# Patient Record
Sex: Male | Born: 1970 | Race: White | Hispanic: No | Marital: Married | Smoking: Never smoker
Health system: Southern US, Community
[De-identification: ages and names within clinical notes are randomized; demographics above are authoritative.]

## PROBLEM LIST (undated history)

## (undated) DIAGNOSIS — F419 Anxiety disorder, unspecified: Secondary | ICD-10-CM

## (undated) DIAGNOSIS — T7840XA Allergy, unspecified, initial encounter: Secondary | ICD-10-CM

## (undated) DIAGNOSIS — F32A Depression, unspecified: Secondary | ICD-10-CM

## (undated) DIAGNOSIS — F329 Major depressive disorder, single episode, unspecified: Secondary | ICD-10-CM

## (undated) DIAGNOSIS — F3181 Bipolar II disorder: Secondary | ICD-10-CM

## (undated) HISTORY — DX: Allergy, unspecified, initial encounter: T78.40XA

## (undated) HISTORY — DX: Anxiety disorder, unspecified: F41.9

## (undated) HISTORY — DX: Bipolar II disorder: F31.81

## (undated) HISTORY — DX: Depression, unspecified: F32.A

## (undated) HISTORY — PX: TEMPOROMANDIBULAR JOINT SURGERY: SHX35

## (undated) HISTORY — PX: OTHER SURGICAL HISTORY: SHX169

## (undated) HISTORY — PX: HERNIA REPAIR: SHX51

## (undated) HISTORY — DX: Major depressive disorder, single episode, unspecified: F32.9

---

## 2005-12-24 ENCOUNTER — Ambulatory Visit: Payer: Self-pay | Admitting: Internal Medicine

## 2006-02-16 ENCOUNTER — Ambulatory Visit: Payer: Self-pay | Admitting: Internal Medicine

## 2007-01-05 ENCOUNTER — Ambulatory Visit: Payer: Self-pay | Admitting: Internal Medicine

## 2007-03-23 ENCOUNTER — Telehealth (INDEPENDENT_AMBULATORY_CARE_PROVIDER_SITE_OTHER): Payer: Self-pay | Admitting: *Deleted

## 2007-04-05 DIAGNOSIS — F329 Major depressive disorder, single episode, unspecified: Secondary | ICD-10-CM | POA: Insufficient documentation

## 2008-04-13 ENCOUNTER — Ambulatory Visit: Payer: Self-pay | Admitting: Family Medicine

## 2008-04-13 DIAGNOSIS — K5289 Other specified noninfective gastroenteritis and colitis: Secondary | ICD-10-CM | POA: Insufficient documentation

## 2008-04-13 DIAGNOSIS — E860 Dehydration: Secondary | ICD-10-CM | POA: Insufficient documentation

## 2008-04-13 DIAGNOSIS — F319 Bipolar disorder, unspecified: Secondary | ICD-10-CM | POA: Insufficient documentation

## 2010-09-17 NOTE — Progress Notes (Signed)
  Phone Note From Pharmacy   Reason for Call: Needs renewal Summary of Call: refill Seroquel 50 mg. # 60  no  refills faxed to [facility] [employer]) Initial call taken by: Lynann Beaver CMA,  March 23, 2007 1:38 PM

## 2010-09-17 NOTE — Assessment & Plan Note (Signed)
Summary: nausea/vomiting/diarrhea/dm   Vital Signs:  Patient Profile:   40 Years Old Male Weight:      198 pounds Temp:     98.3 degrees F Pulse rate:   82 / minute BP sitting:   120 / 80  (left arm) Cuff size:   regular  Vitals Entered By: Pura Spice, RN (April 13, 2008 2:01 PM)                 Chief Complaint:  diarrhea and vomiting .  History of Present Illness: Onset diarrhea nausea and vomiting over past 24 hrs, unable to retain fluids. Has had 6-8 loose stools, nonblack, no blood. No contact with other sick people with GI problems desires refill Rx no other complaints    Current Allergies (reviewed today): No known allergies      Review of Systems       The patient complains of abdominal pain.  The patient denies anorexia, fever, weight loss, weight gain, vision loss, decreased hearing, hoarseness, chest pain, syncope, dyspnea on exertion, peripheral edema, prolonged cough, headaches, hemoptysis, melena, hematochezia, severe indigestion/heartburn, hematuria, incontinence, genital sores, muscle weakness, suspicious skin lesions, transient blindness, difficulty walking, depression, unusual weight change, abnormal bleeding, enlarged lymph nodes, angioedema, breast masses, and testicular masses.     Physical Exam  General:     Well-developed,well-nourished,in no acute distress; alert,appropriate and cooperative throughout examination Head:     Normocephalic and atraumatic without obvious abnormalities. No apparent alopecia or balding. Eyes:     No corneal or conjunctival inflammation noted. EOMI. Perrla. Funduscopic exam benign, without hemorrhages, exudates or papilledema. Vision grossly normal. Ears:     External ear exam shows no significant lesions or deformities.  Otoscopic examination reveals clear canals, tympanic membranes are intact bilaterally without bulging, retraction, inflammation or discharge. Hearing is grossly normal bilaterally. Nose:  External nasal examination shows no deformity or inflammation. Nasal mucosa are pink and moist without lesions or exudates. Mouth:     dfry oral mucosa Lungs:     Normal respiratory effort, chest expands symmetrically. Lungs are clear to auscultation, no crackles or wheezes. Heart:     Normal rate and regular rhythm. S1 and S2 normal without gallop, murmur, click, rub or other extra sounds. Abdomen:     generalised tnendernessno distention, no masses, no guarding, no hepatomegaly, no splenomegaly, and bowel sounds hyperactive.   Rectal:     NE    Impression & Recommendations:  Problem # 1:  GASTROENTERITIS (ICD-558.9) Assessment: New  His updated medication list for this problem includes:    Lomotil 2.5-0.025 Mg Tabs (Diphenoxylate-atropine) .Marland Kitchen... 2 tabs qid as needed for diarrhea  Orders: Promethazine up to 50mg  (J2550) Admin of Therapeutic Inj  intramuscular or subcutaneous (08657)   Problem # 2:  BIPOLAR DISORDER UNSPECIFIED (ICD-296.80) Assessment: Unchanged  Problem # 3:  DEHYDRATION (ICD-276.51) Assessment: New  Complete Medication List: 1)  Lexapro 10 Mg Tabs (Escitalopram oxalate) .... Take 1 tablet by mouth once a day 2)  Seroquel 25 Mg Tabs (Quetiapine fumarate) .... Take 2 tablet by mouth at bedtime 3)  Vicodin 5-500 Mg Tabs (Hydrocodone-acetaminophen) .... Take 1 tablet by mouth twice a day 4)  Lomotil 2.5-0.025 Mg Tabs (Diphenoxylate-atropine) .... 2 tabs qid as needed for diarrhea 5)  Promethazine Hcl 25 Mg Tabs (Promethazine hcl) .Marland Kitchen.. 1 q 4h as needed to prevent nausea vomiting   Patient Instructions: 1)  acute gastroenteritis 2)  phenergan 50 mg  Im for vomiting 3)  Phenergan tabs  25 1 every 4 hours if needed to prevent nausea vomitin 4)  Lomitil for diarrhea 5)  refilled  seroquel and lexapro 6)  diet clear liquids as gatorade, ease back to reg diet   Prescriptions: PROMETHAZINE HCL 25 MG TABS (PROMETHAZINE HCL) 1 q 4h as needed to prevent nausea  vomiting  #50 x 1   Entered and Authorized by:   Judithann Sheen MD   Signed by:   Judithann Sheen MD on 04/13/2008   Method used:   Electronically to        [facility] [phone]* (retail)       [address] [place], [place]  [address]       Ph: [phone] or [phone]       Fax: [phone]   RxID:   [id number] LEXAPRO 10 MG TABS (ESCITALOPRAM OXALATE) Take 1 tablet by mouth once a day  #30 x 5   Entered and Authorized by:   Judithann Sheen MD   Signed by:   Judithann Sheen MD on 04/13/2008   Method used:   Electronically to        [facility] [phone]* (retail)       [address] [place], [place]  [address]       Ph: [phone] or [phone]       Fax: [phone]   RxID:   [id number] SEROQUEL 25 MG TABS (QUETIAPINE FUMARATE) Take 2 tablet by mouth at bedtime  #60 x 5   Entered and Authorized by:   Judithann Sheen MD   Signed by:   Judithann Sheen MD on 04/13/2008   Method used:   Electronically to        [facility] [phone]* (retail)       [address] [place], [place]  [address]       Ph: [phone] or [phone]       Fax: [phone]   RxID:   [id number] LOMOTIL 2.5-0.025 MG TABS (DIPHENOXYLATE-ATROPINE) 2 tabs qid as needed for diarrhea  #50 x 0   Entered and Authorized by:   Judithann Sheen MD   Signed by:   Judithann Sheen MD on 04/13/2008   Method used:   Print then Give to Patient   RxID:   [id number] SEROQUEL 25 MG TABS (QUETIAPINE FUMARATE) Take 2 tablet by mouth at bedtime  #60 x 5   Entered and Authorized by:   Judithann Sheen MD   Signed by:   Judithann Sheen MD on 04/13/2008   Method used:   Electronically to        [facility] [phone]* (retail)       [address]       [place], [place]  [address]       Ph: [phone]       Fax: [phone]   RxID:   [id number] LEXAPRO 10 MG  TABS (ESCITALOPRAM OXALATE) Take 1 tablet by mouth once a day  #30 x 5   Entered and Authorized by:   Judithann Sheen MD   Signed by:   Judithann Sheen MD on 04/13/2008   Method used:   Electronically to  [facility]. [phone]* (retail)       [address]       [place], [place]  [address]       Ph: [phone]       Fax: [phone]   RxID:   [id number]  ]  Medication Administration  Injection # 1:    Medication: Promethazine up to 50mg     Diagnosis: GASTROENTERITIS (ICD-558.9)    Route: IM    Site: RUOQ gluteus    Exp Date: 01/2009    Lot #: [id number]    Mfr: [email or web address]    Patient tolerated injection without complications    Given by: Pura Spice, RN (April 13, 2008 2:48 PM)  Orders Added: 1)  Promethazine up to 50mg  [J2550] 2)  Admin of Therapeutic Inj  intramuscular or subcutaneous [96372] 3)  Est. Patient Level III [72536]

## 2012-03-03 ENCOUNTER — Ambulatory Visit: Payer: Self-pay | Admitting: Emergency Medicine

## 2012-03-03 VITALS — BP 120/72 | HR 90 | Temp 98.2°F | Resp 16 | Ht 70.0 in | Wt 170.0 lb

## 2012-03-03 DIAGNOSIS — J018 Other acute sinusitis: Secondary | ICD-10-CM

## 2012-03-03 MED ORDER — AMOXICILLIN-POT CLAVULANATE 875-125 MG PO TABS: 1.0000 | ORAL_TABLET | Freq: Two times a day (BID) | ORAL | Status: AC

## 2012-03-03 NOTE — Progress Notes (Signed)
  Subjective:    Patient ID: Tristan [name], male    DOB: [birth year], 41 y.o.   MRN: [id number]  Sinus Problem This is a recurrent problem. The current episode started 1 to 4 weeks ago. The problem has been gradually worsening since onset. The maximum temperature recorded prior to his arrival was 100 - 100.9 F. The fever has been present for 1 to 2 days. The pain is mild. Associated symptoms include chills, congestion, coughing, sinus pressure and a sore throat. Pertinent negatives include no diaphoresis, ear pain, headaches, hoarse voice, neck pain, shortness of breath, sneezing or swollen glands. Past treatments include oral decongestants.  Cough The current episode started in the past 7 days. The problem occurs hourly. The cough is productive of purulent sputum. Associated symptoms include chills, a fever, nasal congestion, postnasal drip and a sore throat. Pertinent negatives include no chest pain, ear congestion, ear pain, headaches, heartburn, hemoptysis, myalgias, rash, rhinorrhea, shortness of breath, sweats, weight loss or wheezing. Nothing aggravates the symptoms.      Review of Systems  Constitutional: Positive for fever and chills. Negative for weight loss and diaphoresis.  HENT: Positive for congestion, sore throat, postnasal drip and sinus pressure. Negative for ear pain, hoarse voice, rhinorrhea, sneezing and neck pain.   Eyes: Negative.   Respiratory: Positive for cough. Negative for hemoptysis, shortness of breath and wheezing.   Cardiovascular: Negative for chest pain.  Gastrointestinal: Negative for heartburn.  Genitourinary: Negative.   Musculoskeletal: Negative for myalgias.  Skin: Negative for rash.  Neurological: Negative for headaches.       Objective:   Physical Exam  Constitutional: He is oriented to person, place, and time. He appears well-developed and well-nourished.  HENT:  Head: Normocephalic and atraumatic.  Eyes: Conjunctivae are normal. Pupils are  equal, round, and reactive to light.  Neck: Normal range of motion. Neck supple.  Cardiovascular: Normal rate and regular rhythm.   Pulmonary/Chest: Effort normal.  Abdominal: Soft.  Musculoskeletal: Normal range of motion.  Neurological: He is alert and oriented to person, place, and time.  Skin: Skin is warm and dry.          Assessment & Plan:  Acute sinusitis. augmentin mucinex d  Follow up as needed

## 2013-01-11 ENCOUNTER — Telehealth: Payer: Self-pay | Admitting: Internal Medicine

## 2013-01-11 ENCOUNTER — Ambulatory Visit (INDEPENDENT_AMBULATORY_CARE_PROVIDER_SITE_OTHER): Payer: Self-pay | Admitting: Family Medicine

## 2013-01-11 ENCOUNTER — Encounter: Payer: Self-pay | Admitting: Family Medicine

## 2013-01-11 VITALS — BP 102/68 | Temp 98.1°F | Wt 182.0 lb

## 2013-01-11 DIAGNOSIS — L255 Unspecified contact dermatitis due to plants, except food: Secondary | ICD-10-CM

## 2013-01-11 DIAGNOSIS — L237 Allergic contact dermatitis due to plants, except food: Secondary | ICD-10-CM

## 2013-01-11 MED ORDER — PREDNISONE 20 MG PO TABS: ORAL_TABLET | ORAL | Status: DC

## 2013-01-11 NOTE — Telephone Encounter (Signed)
Patient Information:  Caller Name: Lanora Manis  Phone: [phone]  Patient: Tristan [name], Tristan [name]  Gender: Male  DOB: 16-Sep-1970  Age: 42 Years  PCP: Birdie Sons (Adults only)  Office Follow Up:  Does the office need to follow up with this patient?: Yes  Instructions For The Office: Reqeusting Rx for Prednisone without office visit.  Please see note.  RN Note:  Sympotms improved while using old Prednisone RX but recurred when ran out of Prednisone after 6 or 7 days.  Comfort measures reviewed including use of cold compresses & Benadryl for itching.  Reviewed signs of cellulits.   Symptoms  Reason For Call & Symptoms: Requesting refill of Prednisone taper for poison ivy rash on forearms and legs.  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  Requests Prednisone be refilled due to high deductible health plan.  Or advise how much it would cost to be seen in the office.  Pharmacy:[facility] at [facility].  Please call back to advise if must be seen or Rx will be ordered.

## 2013-01-11 NOTE — Telephone Encounter (Signed)
Pt is sch for today with Dr Selena Batten 4pm

## 2013-01-11 NOTE — Progress Notes (Signed)
Chief Complaint  Patient presents with  . 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 got better, but then got worse after stopped prednisone a few days ago ROS: See pertinent positives and negatives per HPI.  No past medical history on file.  No family history on file.  History   Social History  . Marital Status: Married    Spouse Name: N/A    Number of Children: N/A  . Years of Education: N/A   Social History Main Topics  . Smoking status: Never Smoker   . Smokeless tobacco: None  . Alcohol Use: Yes     Comment: rarely mixed drinks  . Drug Use: None  . Sexually Active: None   Other Topics Concern  . None   Social History Narrative  . None    Current outpatient prescriptions:predniSONE (DELTASONE) 20 MG tablet, 60mg  (3 tabs) for 3 days, then 40mg  (2 tabs) for 3 days, then 20mg  (1 tab for 3 days) then stop, Disp: 18 tablet, Rfl: 0  EXAM:  Filed Vitals:   01/11/13 1600  BP: 102/68  Temp: 98.1 F (36.7 C)    Body mass index is 26.11 kg/(m^2).  GENERAL: vitals reviewed and listed above, alert, oriented, appears well hydrated and in no acute distress  HEENT: atraumatic, conjunttiva clear, no obvious abnormalities on inspection of external nose and ears  NECK: no obvious masses on inspection  LUNGS: clear to auscultation bilaterally, no wheezes, rales or rhonchi, good air movement  CV: HRRR, no peripheral edema  SKIN: erythematous papular vesicular rash on arms and legs in patchy distribution with excoriations  MS: moves all extremities without noticeable abnormality  PSYCH: pleasant and cooperative, no obvious depression or anxiety  ASSESSMENT AND  PLAN:  Discussed the following assessment and plan:  Poison oak - Plan: predniSONE (DELTASONE) 20 MG tablet  -discussed options, since widespread and seems to have had rebound with stopping short course steroids he would like to do longer course. Discussed risks. Return precautions. -Patient advised to return or notify a doctor immediately if symptoms worsen or persist or new concerns arise.  There are no Patient Instructions on file for this visit.   Kriste Basque R.

## 2013-01-11 NOTE — Telephone Encounter (Signed)
Dr Cato Mulligan is out of the office this week.  Pt needs to be seen

## 2013-05-22 ENCOUNTER — Ambulatory Visit: Payer: Self-pay | Admitting: Family Medicine

## 2013-05-22 VITALS — BP 122/74 | HR 79 | Temp 98.1°F | Resp 18 | Ht 71.25 in | Wt 164.2 lb

## 2013-05-22 DIAGNOSIS — S0085XA Superficial foreign body of other part of head, initial encounter: Secondary | ICD-10-CM

## 2013-05-22 DIAGNOSIS — G501 Atypical facial pain: Secondary | ICD-10-CM

## 2013-05-22 DIAGNOSIS — S0005XA Superficial foreign body of scalp, initial encounter: Secondary | ICD-10-CM

## 2013-05-22 DIAGNOSIS — IMO0001 Reserved for inherently not codable concepts without codable children: Secondary | ICD-10-CM

## 2013-05-22 NOTE — Progress Notes (Signed)
Subjective:    Patient ID: Tristan [name], male    DOB: [birth year], 42 y.o.   MRN: [id number]   9:07 AM - This chart was scribed for Dr. Milus Glazier, MD, by Valera Castle, ED Scribe.    HPI Tristan [name] is a 42 year old male who reports to the [facility] being hit in the face by a piece of wood when the threw it into a machine for work, 3 weeks ago. He states that when he threw in the wood it bounced back and struck him on the right side of his face below his eye. He reports that he received a black eye, along with swelling around the right facial area. He reports that he was initially able to take out a piece of wood from the area and that he applied ice and the swelling gradually resided. He reports that the area has healed over a bit since the initial incident but that it is still sore to the touch, and that it is uncomfortable to sleep on at night. He states he usually sleeps on that side of his face. He reports that cracking his nose also exacerbates the pain. He was worried that he had a major fracture initially. He requests that the splinter be removed.   Past Medical History  Diagnosis Date  . Allergy   . Depression   . Anxiety   . Bipolar 2 disorder    Past Surgical History  Procedure Laterality Date  . Hernia repair    . Undescended testicle    . Temporomandibular joint surgery     Family History  Problem Relation Age of Onset  . Diabetes Father   . Stroke Father   . Heart disease Father   . Hypertension Father    History   Social History  . Marital Status: Married    Spouse Name: N/A    Number of Children: N/A  . Years of Education: N/A   Occupational History  . Not on file.   Social History Main Topics  . Smoking status: Never Smoker   . Smokeless tobacco: Not on file  . Alcohol Use: Yes     Comment: rarely mixed drinks  . Drug Use: Not on file  . Sexual Activity: Not on file   Other Topics Concern  . Not on file   Social  History Narrative  . No narrative on file   No Known Allergies   Review of Systems  Skin:       Wood splinter to right face with small laceration.  All other systems reviewed and are negative.      Objective:   Physical Exam  Nursing note and vitals reviewed. Constitutional: He is oriented to person, place, and time. He appears well-developed and well-nourished. No distress.  HENT:  Head: Normocephalic and atraumatic.  Eyes: EOM are normal.  Neck: Neck supple. No tracheal deviation present.  Cardiovascular: Normal rate.   Pulmonary/Chest: Effort normal. No respiratory distress.  Musculoskeletal: Normal range of motion.  Neurological: He is alert and oriented to person, place, and time.  Skin: Skin is warm and dry.  Small laceration with wood splinter to right cheek.  Psychiatric: He has a normal mood and affect. His behavior is normal.     Triage Vitals:  Filed Vitals:   05/22/13 0844  BP: 122/74  Pulse: 79  Temp: 98.1 F (36.7 C)  TempSrc: Oral  Resp: 18  Height: 5' 11.25" (1.81 m)  Weight:  164 lb 3.2 oz (74.481 kg)  SpO2: 100%       Assessment & Plan:  DIAGNOSTIC STUDIES: Oxygen Saturation is 100% on room air, normal by my interpretation.    COORDINATION OF CARE: 9:18 AM - Discussed treatment plan with pt at bedside and pt agreed to plan. Discussed needing to perform a procedure which includes numbing the pt's right cheek and removing a piece of wood.  9:51 AM - Procedure performed by Dr. Milus Glazier. Area prepped with Betadine .5 cm area to right facial area 2 mm foreign body found Wound closed with 5.0 horizontal mattress stitch. Pt became vagal, but recovered.    Sutures were applied to wound. Advised pt that the closed wound should heal in a few days.   Plan:  Recheck 48 hours.  Patient may need surgical referral for further exploration  Signed, Elvina Sidle, MD

## 2015-05-17 ENCOUNTER — Telehealth: Payer: Self-pay | Admitting: Internal Medicine

## 2015-05-17 NOTE — Telephone Encounter (Signed)
Pt would like to est with dr Amador Cunas. Pt would like an internal med md. Can I sch?

## 2015-05-17 NOTE — Telephone Encounter (Signed)
I am no longer taking new patients  

## 2015-05-18 NOTE — Telephone Encounter (Signed)
Pt wife is aware

## 2016-02-27 ENCOUNTER — Ambulatory Visit (INDEPENDENT_AMBULATORY_CARE_PROVIDER_SITE_OTHER): Payer: Managed Care, Other (non HMO)

## 2016-02-27 ENCOUNTER — Ambulatory Visit (INDEPENDENT_AMBULATORY_CARE_PROVIDER_SITE_OTHER): Payer: Managed Care, Other (non HMO) | Admitting: Urgent Care

## 2016-02-27 VITALS — BP 132/80 | HR 66 | Temp 97.3°F | Resp 16 | Ht 71.0 in | Wt 173.8 lb

## 2016-02-27 DIAGNOSIS — M7989 Other specified soft tissue disorders: Secondary | ICD-10-CM | POA: Diagnosis not present

## 2016-02-27 DIAGNOSIS — M79641 Pain in right hand: Secondary | ICD-10-CM | POA: Diagnosis not present

## 2016-02-27 MED ORDER — NAPROXEN SODIUM 550 MG PO TABS: 550.0000 mg | ORAL_TABLET | Freq: Two times a day (BID) | ORAL | Status: DC

## 2016-02-27 NOTE — Progress Notes (Signed)
    MRN: [id number] DOB: Mar 04, 1971  Subjective:   Tristan [name] is a 45 y.o. male presenting for chief complaint of Hand Injury  Reports onset of right hand discomfort this morning (03:00). Has since developed dull aching pain worsened by gripping, grasping. Also has swelling. Has been painting a lot more the past 2 weeks. Works as an Tree surgeonartist, is right hand. Has tried APAP once with some relief. Denies fever, trauma, redness, numbness or tingling. Denies history of hand injuries, surgeries.   Tristan [name] has a current medication list which includes the following prescription(s): prednisone. Also has No Known Allergies.  Tristan [name]  has a past medical history of Allergy; Depression; Anxiety; and Bipolar 2 disorder (HCC). Also  has past surgical history that includes Hernia repair; Undescended testicle; and Temporomandibular joint surgery.  Objective:   Vitals: BP 132/80 mmHg  Pulse 66  Temp(Src) 97.3 F (36.3 C)  Resp 16  Ht 5\' 11"  (1.803 m)  Wt 173 lb 12.8 oz (78.835 kg)  BMI 24.25 kg/m2  SpO2 99%  Physical Exam  Constitutional: He is oriented to person, place, and time. He appears well-developed and well-nourished.  Cardiovascular: Normal rate.   Pulmonary/Chest: Effort normal.  Musculoskeletal:       Right hand: He exhibits tenderness (over area depicted with ROM testing only). He exhibits normal range of motion, no bony tenderness, normal two-point discrimination, normal capillary refill, no deformity, no laceration and no swelling. Normal sensation noted. Normal strength noted.       Hands: Neurological: He is alert and oriented to person, place, and time.  Skin: Skin is warm and dry.   Dg Hand Complete Right  02/27/2016  CLINICAL DATA:  Acute right hand pain and swelling. No known injury. EXAM: RIGHT HAND - COMPLETE 3+ VIEW COMPARISON:  None. FINDINGS: There is no evidence of fracture or dislocation. There is no evidence of arthropathy or other focal bone abnormality. Soft  tissues are unremarkable. IMPRESSION: Negative. Electronically Signed   By: Harmon PierJeffrey  Hu M.D.   On: 02/27/2016 18:52   Assessment and Plan :   1. Right hand pain 2. Swelling of right hand - Likely due to overuse secondary to increased activity/painting. Recommended conservative management, start Anaprox. RTC in 1 week if no improvement. Consider referral to PT or orthopedics.  Wallis BambergMario Arliss Frisina, PA-C [facility] [phone] 02/27/2016 6:09 PM

## 2016-02-27 NOTE — Patient Instructions (Signed)
     IF you received an x-ray today, you will receive an invoice from [facility]. Please contact [facility] at [phone] with questions or concerns regarding your invoice.   IF you received labwork today, you will receive an invoice from [facility]. Please contact [facility] at [phone] with questions or concerns regarding your invoice.   Our billing staff will not be able to assist you with questions regarding bills from these companies.  You will be contacted with the lab results as soon as they are available. The fastest way to get your results is to activate your My Chart account. Instructions are located on the last page of this paperwork. If you have not heard from us regarding the results in 2 weeks, please contact this office.      

## 2018-06-04 ENCOUNTER — Ambulatory Visit (INDEPENDENT_AMBULATORY_CARE_PROVIDER_SITE_OTHER): Payer: 59 | Admitting: Psychiatry

## 2018-06-04 DIAGNOSIS — F411 Generalized anxiety disorder: Secondary | ICD-10-CM

## 2018-06-04 DIAGNOSIS — Z630 Problems in relationship with spouse or partner: Secondary | ICD-10-CM

## 2018-06-04 DIAGNOSIS — F3131 Bipolar disorder, current episode depressed, mild: Secondary | ICD-10-CM | POA: Diagnosis not present

## 2018-06-04 NOTE — Assessment & Plan Note (Signed)
Primary issues of FOO strife Med sensitivites Gains made in PTSD-like ANS arousal and anger using alpha blocker, stress management, circadian rhythm strategies, and reduction in [employer]

## 2018-06-04 NOTE — Progress Notes (Signed)
[facility] Counselor/Therapist Progress Note   Patient ID: TERRELL OSTRAND, MRN: [id number]  Date: 06/04/2018  Timespent: 45 minutes Start time: 8:09a  Stop time: 8:54a  Treatment Type: Family with patient  Subjective:  Finally poised to get siding repairs done, working through further dealings with [employer] and contractors, with wife handling the administrative communication work.  Dealing with a disputed invoice and insurance limitations on payment.  Relationally, both continue commitment to offering each other grace and staying courteously mindful of each other stress level, quick to frame each other's reactions as environmental rather than personal.  Both are also embarking on quitting smoking, she tobacco, he THC.  Attitude is cautiously optimistic on both parts.  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  Notes anxiety approaching his first use of a resident casting device, is approaching inner dialogue himself and anxiety, between himself and his demanding voice.  Buoyed by a very positive experience at a small, quirky art show in [place] last weekend, where he sold a large amount of small items he had wanted to clean out, made particularly interesting connections with like minded artists and self-described weirdos, found that his materials were exceptionally well received, including being notably used in a performer's puppet show.  A positive experience on many levels, including increased sense of connection, gaining fans, making space and money, and gaining a sense of comeback after a February experience with the same group where he felt particularly alienated, anxious, and agitated.    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  At the same time, he foresees "seasonal mother fuckers" in his birthday near Thanksgiving, and the traditional family holidays coming up, and in diminishing daylight this time of year.  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  In addition, [name] will have foot surgery on 11/12, which will entail PT dividing caregiving time which may compete with his artistic interests but which will also likely serve as bonding time.  Wife's job, largely pursued from home, has been particularly stressful, and may at times trigger outbursts from her, but again, both members of the couple are practicing quick recognition in [place] with each other's venting.  Interventions:Solution Focused, Strength-based and Anger Management Training Affirmed work in progress, particularly maintaining grace under pressure and maintaining benevolent interpretation with each other.  Affirmed diligence approaching his work and internal stresses.  Role played anxious, demanding, and supportive observer voices in his inner dialogue as pertains to the experience of approaching resin work.  Discussed options for seasonal light therapy and endorsed the idea of a full spectrum light for the shop.  Mental Status Exam:   Appearance:   Casual and idiosyncratic fashion, appropriate     Behavior:  Appropriate and Sharing  Motor:  Normal  Speech/Language:   Clear and Coherent and goal-directed  Affect:  Appropriate  Mood:  mildly anxious, generally more positive  Thought process:  Coherent  Thought content:    NA  Perceptual disturbances:    Normal  Orientation:  Full (Time, Place, and Person)  Attention:  Good  Concentration:  good  Memory:  N/A  Fund of knowledge:   Good  Insight:    Good  Judgment:   Good  Impulse  Control:  good    Reported Symptoms:  Apprehension/worry, anxious anticipation (of depressive symptoms, evocative memories)  Risk Assessment: Danger to Self:  No Self-injurious Behavior: No Danger to Others: No Duty to Warn:no Physical Aggression / Violence:No  Access to Firearms a concern: No  Gang Involvement:No   Diagnosis:   ICD-10-CM   1. Generalized anxiety disorder F41.1   2. Bipolar affective disorder, currently depressed, mild (HCC) F31.31   3. Relationship problem between partners Z63.0      Plan:  . Continue to approach art work and any internal apprehension about activities employing supportive-voice mental dialogue . Schedule follow-up on birthday, preparatory for Thanksgiving triggers.   Call if earlier need. . Continue to utilize previously learned skills ad lib . Maintain medication, if prescribed, and work faithfully with relevant prescriber(s) . Call the clinic on-call service, present to ER, or call 911 if any life-threatening emergency   [name], PhD

## 2018-06-08 ENCOUNTER — Encounter: Payer: Self-pay | Admitting: Emergency Medicine

## 2018-06-08 DIAGNOSIS — G4700 Insomnia, unspecified: Secondary | ICD-10-CM | POA: Insufficient documentation

## 2018-06-08 DIAGNOSIS — F1210 Cannabis abuse, uncomplicated: Secondary | ICD-10-CM | POA: Insufficient documentation

## 2018-06-08 DIAGNOSIS — F514 Sleep terrors [night terrors]: Secondary | ICD-10-CM

## 2018-06-08 DIAGNOSIS — F4310 Post-traumatic stress disorder, unspecified: Secondary | ICD-10-CM | POA: Insufficient documentation

## 2018-06-08 DIAGNOSIS — F411 Generalized anxiety disorder: Secondary | ICD-10-CM | POA: Insufficient documentation

## 2018-06-18 ENCOUNTER — Encounter: Payer: Self-pay | Admitting: Physician Assistant

## 2018-06-18 ENCOUNTER — Ambulatory Visit (INDEPENDENT_AMBULATORY_CARE_PROVIDER_SITE_OTHER): Payer: 59 | Admitting: Physician Assistant

## 2018-06-18 DIAGNOSIS — F319 Bipolar disorder, unspecified: Secondary | ICD-10-CM | POA: Diagnosis not present

## 2018-06-18 DIAGNOSIS — F411 Generalized anxiety disorder: Secondary | ICD-10-CM

## 2018-06-18 DIAGNOSIS — G4700 Insomnia, unspecified: Secondary | ICD-10-CM

## 2018-06-18 MED ORDER — HYDROXYZINE HCL 25 MG PO TABS: 25.0000 mg | ORAL_TABLET | Freq: Three times a day (TID) | ORAL | 1 refills | Status: DC | PRN

## 2018-06-18 MED ORDER — OXCARBAZEPINE 300 MG PO TABS: 300.0000 mg | ORAL_TABLET | ORAL | 1 refills | Status: DC

## 2018-06-18 MED ORDER — BUSPIRONE HCL 15 MG PO TABS: ORAL_TABLET | ORAL | 1 refills | Status: DC

## 2018-06-18 MED ORDER — TRAZODONE HCL 50 MG PO TABS: 50.0000 mg | ORAL_TABLET | Freq: Every day | ORAL | 1 refills | Status: DC

## 2018-06-18 MED ORDER — LAMOTRIGINE 150 MG PO TABS: 150.0000 mg | ORAL_TABLET | Freq: Two times a day (BID) | ORAL | 1 refills | Status: DC

## 2018-06-18 NOTE — Progress Notes (Signed)
[facility] Med Check  Patient ID: Tristan [name],  MRN: [id number]  PCP: Patient, No Pcp Per  Date of Evaluation: 06/18/2018 Time spent:15 minutes  Chief Complaint:  Chief Complaint    Follow-up      HISTORY/CURRENT STATUS: HPI Here for 6 week med check.  Accompanied by his wife Tristan [name].  At LOV, we stopped the Prazosin b/c lack of stamina, energy and motivation.  Since he is gotten completely off of it, he feels much better.  He is able to do more things that he wants to do.  On the flip side, the anxiety has gotten a little bit worse.  The hydroxyzine helps, as long as he is not already too anxious.  It does not cause too much drowsiness.  The anxiety is more generalized, rather than panic attack type physical symptoms.  He denies night terrors now.  He does have weird dreams but nothing traumatic.  He is able to enjoy some things.  He is not isolating.  He has not smoked pot in several weeks, and is feeling good about that.  He has been using the trazodone again which has helped him sleep.  If he does not use it, he has trouble going to sleep and does not feel as rested when he gets up.  Past medications for mental health diagnoses include: Celexa, Lamictal, Lexapro, Seroquel, praises and, Trileptal, trazodone, Vistaril  Individual Medical History/ Review of Systems: Changes? :Yes Urinary leakage has completely resolved since of Prazosin  Allergies: Patient has no known allergies.  Current Medications:  Current Outpatient Medications:  .  hydrOXYzine (ATARAX/VISTARIL) 25 MG tablet, Take 1 tablet (25 mg total) by mouth every 8 (eight) hours as needed for anxiety., Disp: 270 tablet, Rfl: 1 .  lamoTRIgine (LAMICTAL) 150 MG tablet, Take 1 tablet (150 mg total) by mouth 2 (two) times daily., Disp: 180 tablet, Rfl: 1 .  Oxcarbazepine (TRILEPTAL) 300 MG tablet, Take 1 tablet (300 mg total) by mouth every morning. 1 qam and 2 qpm, Disp: 270 tablet, Rfl: 1 .  traZODone (DESYREL)  50 MG tablet, Take 50-100 mg by mouth at bedtime as needed for sleep., Disp: , Rfl:  .  busPIRone (BUSPAR) 15 MG tablet, 1/3 po bid x 5 days, then 2/3 po bid x 5 days, then 1 po bid., Disp: 60 tablet, Rfl: 1 .  hydrOXYzine (VISTARIL) 25 MG capsule, Take 25 mg by mouth 4 (four) times daily., Disp: , Rfl:  .  naproxen sodium (ANAPROX DS) 550 MG tablet, Take 1 tablet (550 mg total) by mouth 2 (two) times daily with a meal. (Patient not taking: Reported on 06/18/2018), Disp: 30 tablet, Rfl: 1 .  prazosin (MINIPRESS) 1 MG capsule, Take 1 mg by mouth at bedtime. Take 4 every night for a week, then 3 for a week, then 2 for a week, then 1 for a week, then stop. If nightmares recur, stay at whatever dose and call provider., Disp: , Rfl:  .  prazosin (MINIPRESS) 5 MG capsule, Take 5 mg by mouth at bedtime., Disp: , Rfl:  .  traZODone (DESYREL) 50 MG tablet, Take 1-2 tablets (50-100 mg total) by mouth at bedtime., Disp: 180 tablet, Rfl: 1 Medication Side Effects: none  Family Medical/ Social History: Changes? No  MENTAL HEALTH EXAM:  There were no vitals taken for this visit.There is no height or weight on file to calculate BMI.  General Appearance: Well Groomed  Eye Contact:  Good  Speech:  Clear and Coherent  Volume:  Normal  Mood:  Anxious  Affect:  Anxious  Thought Process:  Goal Directed  Orientation:  Full (Time, Place, and Person)  Thought Content: Logical   Suicidal Thoughts:  No  Homicidal Thoughts:  No  Memory:  WNL  Judgement:  Good  Insight:  Good  Psychomotor Activity:  Normal  Concentration:  Concentration: Good  Recall:  Good  Fund of Knowledge: Good  Language: Good  Assets:  Desire for Improvement  ADL's:  Intact  Cognition: WNL  Prognosis:  Good    DIAGNOSES:    ICD-10-CM   1. Bipolar I disorder (HCC) F31.9   2. Insomnia, unspecified type G47.00   3. Generalized anxiety disorder F41.1     Receiving Psychotherapy: Yes With Dr.Andy Mitchem   RECOMMENDATIONS: We  discussed other options to help with the anxiety.  We could increase the Trileptal, and check a level a week or so after the increase.  Another option would be to add BuSpar.  Another option is to take the hydroxyzine routinely versus as needed.  Or add an SSRI or SNRI, or a benzo, these options are not preferred by [place]. He decided to add BuSpar 15 mg one third twice daily for 5 days, two thirds twice daily for 5 days and then 1 p.o. twice daily.   Continue Trileptal, Lamictal, trazodone, hydroxyzine as above. Continue psychotherapy with Dr. Doran Heater Return in 6 weeks.   Melony Overly, PA-C

## 2018-07-09 ENCOUNTER — Ambulatory Visit (INDEPENDENT_AMBULATORY_CARE_PROVIDER_SITE_OTHER): Payer: Managed Care, Other (non HMO) | Admitting: Psychiatry

## 2018-07-09 DIAGNOSIS — F1210 Cannabis abuse, uncomplicated: Secondary | ICD-10-CM

## 2018-07-09 DIAGNOSIS — F411 Generalized anxiety disorder: Secondary | ICD-10-CM

## 2018-07-09 DIAGNOSIS — F4310 Post-traumatic stress disorder, unspecified: Secondary | ICD-10-CM

## 2018-07-09 DIAGNOSIS — F319 Bipolar disorder, unspecified: Secondary | ICD-10-CM

## 2018-07-09 NOTE — Progress Notes (Signed)
Psychotherapy Progress Note -- Marliss CzarAndy Tata Timmins, PhD, [facility]  Patient ID: Tristan [name]     MRN: [id number]     Date: 07/09/2018     Treatment Type: Family therapy w/ patient Start: 8:14a Stop: 9:05a Time Spent: 51 min Accompanied by: wife [name]  Self-Report (interim history, self-report of stressors and symptoms, application of prior therapy, status changes) Further frustrations in construction/repair process, including incomplete siding delivery, unrelated HVAC replacement due to a gas leak not caught 3 times before, delivery driver mangled the gate and fence (necessary for their dogs).  Forced new round of getting estimates and 6-8 weeks' wait, unanticipated vibration from siding work  Dog needed emergency dental surgery, 25 teeth out.  W had foot surgery, regretfully snapped at PT twice recently but no harm according to him.    Birthday today, not with the old sense of dread.  Texts from family numbers for which he has deleted wishing happy birthday.  W apprehensive about seeing her family with the rampant drinking, mother's crabby self-absorption, the sarcasm and surliness of the whole group.    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  Tempted to pack up early after weak sales and some product breakage by kids, but impressed self and wife with resiliency against anger and anxiety, not leaving or raging, just checking in with friends.  Gratified to see 2 of his icons in the art world, man who greatly inspired him with her creativity and kindness decades ago, a kind of homecoming interacting with them.  Therapies used: Solution-Oriented/Positive Psychology and Ego-Supportive  Intervention notes: Reviewed social and organizational stresses, affirming patience shown, humor recognized, quickness  to recognize demands and expectations, quickness to redirect self, expanding definition of family, peace made with losses of relationship with literal family, much more natural forgiveness across moments on wife's part.  Refreshed strategies and coping with dysfunctional family dynamics expected with wife's family next week.  Ego support offered to both in being authentic and non-catastrophic and interacting with people through the holidays.  Mental Status/Observations:     Appearance:   Casual, Fairly Groomed and (due to shower out of commission)     Behavior:  Appropriate  Motor:  Normal  Speech/Language:   Clear and Coherent  Affect:  Appropriate  Mood:  normal and less apprehensive, more accepting  Thought process:  normal  Thought content:    WNL  Sensory/Perceptual disturbances:    WNL  Orientation:  intact  Attention:  Good  Concentration:  Good  Memory:  WNL  Fund of knowledge:   NA  Insight:    Good  Judgment:   Good  Impulse Control:  Good   Risk Assessment: Danger to Self:  No Self-injurious Behavior: No Danger to Others: No Duty to Warn:no Physical Aggression / Violence:No  Access to Firearms a concern: No   Diagnosis:   ICD-10-CM   1. Generalized anxiety disorder F41.1   2. Bipolar I disorder (HCC) F31.9   3. Tetrahydrocannabinol (THC) use disorder, mild, abuse F12.10   4. PTSD (post-traumatic stress disorder) F43.10     Progress rating:  Greatly improved  Plan:  . Continue prompt and adept support for each other, positive regard, quickness to offer comfort where needed . Continue to catch strong expectations and demands of self and situation  and neutralize where appropriate . Continue to utilize previously learned skills ad lib . Maintain medication, if prescribed, and work faithfully with relevant prescriber(s) . Call the clinic on-call service, present to ER, or call 911 if any life-threatening emergency . Follow up with me in about 3 weeks  [name], PhD

## 2018-07-12 ENCOUNTER — Other Ambulatory Visit: Payer: Self-pay

## 2018-07-12 MED ORDER — BUSPIRONE HCL 15 MG PO TABS: ORAL_TABLET | ORAL | 0 refills | Status: DC

## 2018-07-30 ENCOUNTER — Encounter: Payer: Self-pay | Admitting: Physician Assistant

## 2018-07-30 ENCOUNTER — Ambulatory Visit (INDEPENDENT_AMBULATORY_CARE_PROVIDER_SITE_OTHER): Payer: 59 | Admitting: Psychiatry

## 2018-07-30 ENCOUNTER — Ambulatory Visit (INDEPENDENT_AMBULATORY_CARE_PROVIDER_SITE_OTHER): Payer: 59 | Admitting: Physician Assistant

## 2018-07-30 DIAGNOSIS — G4700 Insomnia, unspecified: Secondary | ICD-10-CM

## 2018-07-30 DIAGNOSIS — F319 Bipolar disorder, unspecified: Secondary | ICD-10-CM | POA: Diagnosis not present

## 2018-07-30 DIAGNOSIS — F411 Generalized anxiety disorder: Secondary | ICD-10-CM

## 2018-07-30 DIAGNOSIS — Z630 Problems in relationship with spouse or partner: Secondary | ICD-10-CM

## 2018-07-30 MED ORDER — BUSPIRONE HCL 15 MG PO TABS: ORAL_TABLET | ORAL | Status: DC

## 2018-07-30 NOTE — Progress Notes (Signed)
Psychotherapy Progress Note -- Marliss CzarAndy Eluterio Seymour, PhD, [facility]  Patient ID: Tristan BeechFrederick W Shelnutt     MRN: [id number]     Date: 07/30/2018     Therapy format: Family therapy w/ patient Start: 8:14a Stop: 9:08a Time Spent: 54 min Accompanied by: none  Session narrative -- interim history, self-report of stressors and symptoms, applications of prior therapy, status changes, and interventions in session Made it through quiet Thanksgiving, did the SharonGriffin family (hers, expanded).  Nothing explosive or frustrating nor irlksome.  Didn't need the [employer] game idea.  [name]'s mom was surprisingly controlled vs her usual mercurial ways, and plenty of choices who to mix with.  Slept some on the way home. Both taking lessons about reducing anticipation   Wife recovered from acute aftermath of foot surgery, out of a boot now. PT always ready for her optimistic self-expectations to come down.    Home repairs nearly finished, just the damaged gate and bill for.  Have begun to put things back where they want them, largely enjoying the peace and quiet of no workers after 6 months in chaos and a cascade of frustrations.  Looking ahead, no particular anxiety about family or about feeling the amputation of them acutely.  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  Stymied so far by a 1-chance-to-get-it-right art task he keeps fantasizing going wrong.  Guided success imagery in session, visualizing the feared 1st step, seeing steady hands and his tools well-guided to create the desired effect, and the satisfaction of doing so.    Tamela OddiBetsy fighting worry thoughts herself about two household systems that could fail.  Spoke through in session to the "nag" in her head, reframing her internal dialogue.  Dealt briefly with her holiday grief as well (2nd Xmas since father, 3rd since abortion), and self-imposed pressure to find her own Christmas trappings.  Noted sense of duty, guilt imposed  by mother, to keep and remember ancestors' things, deep wish to bear a child (though it was medically next to impossible to carry to term) and introduce to her father, who died mere weeks after the due date.  Validated wanting to do the impossible, and offered image of aunt, uncle, and father all letting her know it's OK, they need nothing done out of obligation, only want her to know choice and authority over her own living and the joy of a loving partnership.  Therapeutic modalities: Solution-Oriented/Positive Psychology, Humanistic/Existential and Grief Therapy  Mental Status/Observations:  Appearance:   Casual     Behavior:  Appropriate  Motor:  Normal  Speech/Language:   Clear and Coherent  Affect:  Appropriate  Mood:  anxious and relatively mild  Thought process:  normal  Thought content:    WNL and realistic concerns about meeting expectations  Sensory/Perceptual disturbances:    WNL  Orientation:  WNL  Attention:  Good  Concentration:  Good  Memory:  WNL  Insight:    Good  Judgment:   Good  Impulse Control:  Good   Risk Assessment: Danger to Self:  No Self-injurious Behavior: No Danger to Others: No Duty to Warn:no Physical Aggression / Violence:No  Access to Firearms a concern: No   Diagnosis:   ICD-10-CM   1. Bipolar I disorder (HCC) F31.9   2. Generalized anxiety disorder F41.1   3. Relationship problem between partners Z63.0     Assessment of progress:  improving  Plan:  . Continue beneficent approach to each other, seek to find the "  get to" more htan the "have to" in season's tasks . Follow through on today's artistic ice-breaking . Continue to utilize previously learned skills ad lib . Maintain medication, if prescribed, and work faithfully with relevant prescriber(s) . Call the clinic on-call service, present to ER, or call 911 if any life-threatening emergency . Follow up with me in about 1 mo  [name], PhD

## 2018-07-30 NOTE — Progress Notes (Signed)
[facility] Check  Patient ID: Tristan [name],  MRN: [id number]  PCP: Patient, No Pcp Per  Date of Evaluation: 07/30/2018 Time spent:15 minutes  Chief Complaint:  Chief Complaint    Follow-up      HISTORY/CURRENT STATUS: HPI For 6 week med check after starting Buspar. Accompanied by wife, Tamela OddiBetsy.  After starting the Buspar, the anxiety is a little better. He still wakes up with anxiety every morning.  He can't lay in bed or he gets more anxious.  If he gets up and gets busy, he's ok after a little while. Not so much PA but an underlying sense of uneasiness and being on age.  The hydroxyzine helps as well.  He is having some bad dreams at night but there is nothing like they used to be.  The imagery does not last all day long like they used to.  He is able to enjoy things more.  His energy and motivation is pretty good.  He is not isolating.  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  Patient denies increased energy with decreased need for sleep, no increased talkativeness, no racing thoughts, no impulsivity or risky behaviors, no increased spending, no increased libido, no grandiosity. No increased irritability.  He sleeps pretty well with the trazodone.  Individual Medical History/ Review of Systems: Changes? :No    Past medications for mental health diagnoses include: Celexa, Lamictal, Lexapro, Seroquel, praises and, Trileptal, trazodone, Vistaril  Allergies: Patient has no known allergies.  Current Medications:  Current Outpatient Medications:  .  hydrOXYzine (ATARAX/VISTARIL) 25 MG tablet, Take 1 tablet (25 mg total) by mouth every 8 (eight) hours as needed for anxiety., Disp: 270 tablet, Rfl: 1 .  hydrOXYzine (VISTARIL) 25 MG capsule, Take 25 mg by mouth 4 (four) times daily., Disp: , Rfl:  .  lamoTRIgine (LAMICTAL) 150 MG tablet, Take 1 tablet (150 mg total) by mouth 2 (two) times daily., Disp: 180 tablet, Rfl: 1 .  Oxcarbazepine  (TRILEPTAL) 300 MG tablet, Take 1 tablet (300 mg total) by mouth every morning. 1 qam and 2 qpm, Disp: 270 tablet, Rfl: 1 .  traZODone (DESYREL) 50 MG tablet, Take 1-2 tablets (50-100 mg total) by mouth at bedtime., Disp: 180 tablet, Rfl: 1 .  busPIRone (BUSPAR) 15 MG tablet, Take 1 in the morning, and 1.3 pills in evening for 10 days then may increase to 1.6 pills for 10 day, and then may increase to 2 pills q evening if needed., Disp: , Rfl:  Medication Side Effects: none  Family Medical/ Social History: Changes? No  MENTAL HEALTH EXAM:  There were no vitals taken for this visit.There is no height or weight on file to calculate BMI.  General Appearance: Casual and Well Groomed  Eye Contact:  Good  Speech:  Clear and Coherent  Volume:  Normal  Mood:  Euthymic  Affect:  Appropriate  Thought Process:  Goal Directed  Orientation:  Full (Time, Place, and Person)  Thought Content: Logical   Suicidal Thoughts:  No  Homicidal Thoughts:  No  Memory:  WNL  Judgement:  Good  Insight:  Good  Psychomotor Activity:  Normal  Concentration:  Concentration: Good  Recall:  Good  Fund of Knowledge: Good  Language: Good  Assets:  Desire for Improvement  ADL's:  Intact  Cognition: WNL  Prognosis:  Good    DIAGNOSES:    ICD-10-CM   1. Bipolar I disorder (HCC) F31.9   2. Insomnia,  unspecified type G47.00   3. Generalized anxiety disorder F41.1     Receiving Psychotherapy: Yes Dr. Any Mitchum   RECOMMENDATIONS: Increase BuSpar 15 mg by continuing 1 p.o. every morning but then 1.3 pills in the evening for 10 days and if needed can increase to 1.6 pills for 10 days and then if still anxious in the mornings go up to 2 pills at night he and wife verbalized understanding. Continue all other medications. Continue psychotherapy with Dr. Farrel Demark. Return in 6 weeks.   Melony Overly, PA-C

## 2018-08-27 ENCOUNTER — Ambulatory Visit: Payer: Managed Care, Other (non HMO) | Admitting: Psychiatry

## 2018-09-10 ENCOUNTER — Encounter: Payer: Self-pay | Admitting: Physician Assistant

## 2018-09-10 ENCOUNTER — Ambulatory Visit (INDEPENDENT_AMBULATORY_CARE_PROVIDER_SITE_OTHER): Payer: 59 | Admitting: Physician Assistant

## 2018-09-10 ENCOUNTER — Ambulatory Visit (INDEPENDENT_AMBULATORY_CARE_PROVIDER_SITE_OTHER): Payer: 59 | Admitting: Psychiatry

## 2018-09-10 DIAGNOSIS — F411 Generalized anxiety disorder: Secondary | ICD-10-CM

## 2018-09-10 DIAGNOSIS — F4310 Post-traumatic stress disorder, unspecified: Secondary | ICD-10-CM

## 2018-09-10 DIAGNOSIS — F319 Bipolar disorder, unspecified: Secondary | ICD-10-CM | POA: Diagnosis not present

## 2018-09-10 DIAGNOSIS — Z630 Problems in relationship with spouse or partner: Secondary | ICD-10-CM | POA: Diagnosis not present

## 2018-09-10 DIAGNOSIS — F3131 Bipolar disorder, current episode depressed, mild: Secondary | ICD-10-CM | POA: Diagnosis not present

## 2018-09-10 MED ORDER — DIAZEPAM 2 MG PO TABS: 1.0000 mg | ORAL_TABLET | Freq: Three times a day (TID) | ORAL | 0 refills | Status: DC | PRN

## 2018-09-10 MED ORDER — BUSPIRONE HCL 15 MG PO TABS: ORAL_TABLET | ORAL | 1 refills | Status: DC

## 2018-09-10 NOTE — Progress Notes (Signed)
[facility]  Patient ID: Tristan [name],  MRN: [id number]  PCP: Patient, No Pcp Per  Date of Evaluation: 09/10/2018 Time spent:15 minutes  Chief Complaint:   HISTORY/CURRENT STATUS: HPI Here for routine med check.  Tamela OddiBetsy, wife, with him.   Noticing that he tenses up in crowds. Unable to enjoy things he likes to do b/c he tenses up.  Didn't realize this was anxiety. But since he's learning to categorize his feelings, he understands that it shouldn't be that way." I always thought it was just me." Like at a concert or at his shows, he will get sweaty palms, he will have leg shakes or rub his hands on his pants.  The hydroxyzine does help but not enough.  It does not generally cause drowsiness.  Patient denies loss of interest in usual activities and is able to enjoy things.  Denies decreased energy or motivation.  Appetite has not changed.  No extreme sadness, tearfulness, or feelings of hopelessness.  Denies any changes in concentration, making decisions or remembering things.  Denies suicidal or homicidal thoughts.  Patient denies increased energy with decreased need for sleep, no increased talkativeness, no racing thoughts, no impulsivity or risky behaviors, no increased spending, no increased libido, no grandiosity.  He sleeps pretty well now.  Not having night terrors.  Individual Medical History/ Review of Systems: Changes? :No    Past medications for mental health diagnoses include: Celexa, Lamictal, Lexapro, Seroquel, praises and, Trileptal, trazodone, Vistaril   Allergies: Patient has no known allergies.  Current Medications:  Current Outpatient Medications:  .  busPIRone (BUSPAR) 15 MG tablet, Take 1 in the morning, and 1.3 pills in evening for 10 days then may increase to 1.6 pills for 10 day, and then may increase to 2 pills q evening if needed., Disp: , Rfl:  .  hydrOXYzine (ATARAX/VISTARIL) 25 MG tablet, Take 1 tablet (25 mg total) by mouth every 8  (eight) hours as needed for anxiety., Disp: 270 tablet, Rfl: 1 .  hydrOXYzine (VISTARIL) 25 MG capsule, Take 25 mg by mouth 4 (four) times daily., Disp: , Rfl:  .  lamoTRIgine (LAMICTAL) 150 MG tablet, Take 1 tablet (150 mg total) by mouth 2 (two) times daily., Disp: 180 tablet, Rfl: 1 .  Oxcarbazepine (TRILEPTAL) 300 MG tablet, Take 1 tablet (300 mg total) by mouth every morning. 1 qam and 2 qpm, Disp: 270 tablet, Rfl: 1 .  traZODone (DESYREL) 50 MG tablet, Take 1-2 tablets (50-100 mg total) by mouth at bedtime., Disp: 180 tablet, Rfl: 1 Medication Side Effects: none  Family Medical/ Social History: Changes? No  MENTAL HEALTH EXAM:  There were no vitals taken for this visit.There is no height or weight on file to calculate BMI.  General Appearance: Casual and Well Groomed  Eye Contact:  Good  Speech:  Clear and Coherent  Volume:  Normal  Mood:  Euthymic  Affect:  Appropriate  Thought Process:  Goal Directed  Orientation:  Full (Time, Place, and Person)  Thought Content: Logical   Suicidal Thoughts:  No  Homicidal Thoughts:  No  Memory:  WNL  Judgement:  Good  Insight:  Good  Psychomotor Activity:  Normal  Concentration:  Concentration: Good  Recall:  Good  Fund of Knowledge: Good  Language: Good  Assets:  Desire for Improvement  ADL's:  Intact  Cognition: WNL  Prognosis:  Good    DIAGNOSES: No diagnosis found.  Receiving Psychotherapy: Yes With Dr. Marliss CzarAndy Mitchum   RECOMMENDATIONS: We discussed  different options, particularly benzos.  We had wanted to avoid those medications if possible but the patient, his wife, and I agree that it would be beneficial for him to take as needed.  I am not concerned about any addictive potential with him.  Another option would be propranolol.  He is not having palpitations or tachycardia though so that may not be the best option. Start Valium 2 mg, one half up to 2 p.o. every 8 hours as needed anxiety. Continue hydroxyzine 25 mg every 8  hours as needed.  He can take that and if in a couple of hours he needs to take the Valium, that is okay but sedation precautions were given and it will be magnified by taking the 2 together.  He should not drive until he knows how the Valium will affect him. Continue BuSpar 15 mg every morning and 30 mg nightly. Continue Lamictal 150 mg twice daily. Continue Trileptal 300 mg 1 every morning and 2 every afternoon. Continue trazodone 50 to 100 mg nightly as needed sleep. Continue psychotherapy with Dr. [name]. Return in 8 weeks.  Melony Overly, PA-C
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
## 2018-10-08 ENCOUNTER — Ambulatory Visit: Payer: 59 | Admitting: Psychiatry

## 2018-10-11 ENCOUNTER — Other Ambulatory Visit: Payer: Self-pay | Admitting: Physician Assistant

## 2018-11-08 ENCOUNTER — Ambulatory Visit: Payer: 59 | Admitting: Physician Assistant

## 2018-11-17 ENCOUNTER — Encounter: Payer: Self-pay | Admitting: Physician Assistant

## 2018-11-17 ENCOUNTER — Ambulatory Visit (INDEPENDENT_AMBULATORY_CARE_PROVIDER_SITE_OTHER): Payer: 59 | Admitting: Physician Assistant

## 2018-11-17 DIAGNOSIS — F411 Generalized anxiety disorder: Secondary | ICD-10-CM | POA: Diagnosis not present

## 2018-11-17 DIAGNOSIS — F319 Bipolar disorder, unspecified: Secondary | ICD-10-CM | POA: Diagnosis not present

## 2018-11-17 DIAGNOSIS — F4310 Post-traumatic stress disorder, unspecified: Secondary | ICD-10-CM | POA: Diagnosis not present

## 2018-11-17 DIAGNOSIS — G4700 Insomnia, unspecified: Secondary | ICD-10-CM | POA: Diagnosis not present

## 2018-11-17 NOTE — Progress Notes (Signed)
[facility]  Patient ID: Tristan [name],  MRN: [id number]  PCP: Patient, No Pcp Per  Date of Evaluation: 11/17/2018 Time spent:15 minutes  Chief Complaint:  Chief Complaint    Follow-up     Virtual Visit via Telephone Note  I connected with Tristan [name] on 11/17/18 at  3:30 PM EDT by telephone and verified that I am speaking with the correct person using two identifiers.   I discussed the limitations, risks, security and privacy concerns of performing an evaluation and management service by telephone and the availability of in person appointments. I also discussed with the patient that there may be a patient responsible charge related to this service. The patient expressed understanding and agreed to proceed.   HISTORY/CURRENT STATUS: HPI for routine med check.  At the last visit 2 months ago, we added Valium to help with the anxiety.  He has been hesitant to take it very much.  He tries to get by on the hydroxyzine, but it does not seem to be working very well at this point.  It does not make him sleepy every time he takes it, and he does not really know why.  It does not cause him to be so drowsy that he cannot do what he needs to do.  He has been a bit more anxious right now because of the coronavirus pandemic.  He sleeps okay with using the trazodone.  Denies nightmares.  Patient denies loss of interest in usual activities and is able to enjoy things.  Denies decreased energy or motivation.  Appetite has not changed.  No extreme sadness, tearfulness, or feelings of hopelessness.  Denies any changes in concentration, making decisions or remembering things.  Denies suicidal or homicidal thoughts.  Patient denies increased energy with decreased need for sleep, no increased talkativeness, no racing thoughts, no impulsivity or risky behaviors, no increased spending, no increased libido, no grandiosity.  Denies muscle or joint pain, stiffness, or dystonia.  Denies  dizziness, syncope, seizures, numbness, tingling, tremor, tics, unsteady gait, slurred speech, confusion.   Individual Medical History/ Review of Systems: Changes? :No    Past medications for mental health diagnoses include: Celexa, Lamictal, Lexapro, Seroquel, prazosin, Trileptal, trazodone, Vistaril  Allergies: Patient has no known allergies.  Current Medications:  Current Outpatient Medications:  .  busPIRone (BUSPAR) 15 MG tablet, 1 po q am, 2 qhs., Disp: 270 tablet, Rfl: 1 .  diazepam (VALIUM) 2 MG tablet, Take 0.5-2 tablets (1-4 mg total) by mouth every 8 (eight) hours as needed for anxiety., Disp: 60 tablet, Rfl: 0 .  hydrOXYzine (ATARAX/VISTARIL) 25 MG tablet, Take 1 tablet (25 mg total) by mouth every 8 (eight) hours as needed for anxiety., Disp: 270 tablet, Rfl: 1 .  lamoTRIgine (LAMICTAL) 150 MG tablet, Take 1 tablet (150 mg total) by mouth 2 (two) times daily., Disp: 180 tablet, Rfl: 1 .  Oxcarbazepine (TRILEPTAL) 300 MG tablet, Take 1 tablet (300 mg total) by mouth every morning. 1 qam and 2 qpm, Disp: 270 tablet, Rfl: 1 .  traZODone (DESYREL) 50 MG tablet, Take 1-2 tablets (50-100 mg total) by mouth at bedtime. (Patient taking differently: Take 25-100 mg by mouth at bedtime. ), Disp: 180 tablet, Rfl: 1 Medication Side Effects: none  Family Medical/ Social History: Changes? Yes quarantine for coronavirus.  MENTAL HEALTH EXAM:  There were no vitals taken for this visit.There is no height or weight on file to calculate BMI.  General Appearance: Phone visit.  Unable to assess  Eye Contact:  Unable to assess  Speech:  Clear and Coherent  Volume:  Normal  Mood:  Anxious  Affect:  Unable to assess  Thought Process:  Goal Directed  Orientation:  Full (Time, Place, and Person)  Thought Content: Logical   Suicidal Thoughts:  No  Homicidal Thoughts:  No  Memory:  WNL  Judgement:  Good  Insight:  Good  Psychomotor Activity:  Unable to assess  Concentration:  Concentration:  Good  Recall:  Good  Fund of Knowledge: Good  Language: Good  Assets:  Desire for Improvement  ADL's:  Intact  Cognition: WNL  Prognosis:  Good    DIAGNOSES:    ICD-10-CM   1. Bipolar I disorder (HCC) F31.9   2. Generalized anxiety disorder F41.1   3. PTSD (post-traumatic stress disorder) F43.10   4. Insomnia, unspecified type G47.00     Receiving Psychotherapy: Yes With Dr. [name]   RECOMMENDATIONS: We discussed different options for the anxiety. Increase hydroxyzine 25 mg up to 2 pills every 8 hours as needed anxiety.  If that is not effective, or causes too much drowsiness, Increase Valium 2mg  to 2 pills every 8 hours as needed.  If that is not effective then he needs to call and we can increase the dose of the Valium. Continue BuSpar 15 mg 1 p.o. every morning and 2 nightly.  Consider increasing that to help with anxiety but at this point, with the anxiety being more related to circumstances, I do not think increasing the BuSpar will be very beneficial at this point. Continue Lamictal 150 mg twice daily. Continue Trileptal 300 mg p.o. every morning. Continue trazodone 50 mg 1/2-2 p.o. nightly as needed sleep. Continue psychotherapy with Dr. [name]. Return in 3 months or sooner as needed.   Melony Overly, PA-C   This record has been created using [employer].  Chart creation errors have been sought, but may not always have been located and corrected. Such creation errors do not reflect on the standard of medical care.

## 2018-12-08 ENCOUNTER — Other Ambulatory Visit: Payer: Self-pay | Admitting: Physician Assistant

## 2018-12-09 NOTE — Telephone Encounter (Signed)
What quantity for him? Last note dosage increased to two 2 mg tablets every 8 hours

## 2019-01-18 ENCOUNTER — Other Ambulatory Visit: Payer: Self-pay | Admitting: Physician Assistant

## 2019-01-19 ENCOUNTER — Other Ambulatory Visit: Payer: Self-pay | Admitting: Physician Assistant

## 2019-02-17 ENCOUNTER — Ambulatory Visit: Payer: 59 | Admitting: Physician Assistant

## 2019-03-01 ENCOUNTER — Other Ambulatory Visit: Payer: Self-pay | Admitting: Physician Assistant

## 2019-03-23 ENCOUNTER — Encounter: Payer: Self-pay | Admitting: Physician Assistant

## 2019-03-23 ENCOUNTER — Ambulatory Visit (INDEPENDENT_AMBULATORY_CARE_PROVIDER_SITE_OTHER): Payer: 59 | Admitting: Physician Assistant

## 2019-03-23 DIAGNOSIS — F4310 Post-traumatic stress disorder, unspecified: Secondary | ICD-10-CM

## 2019-03-23 DIAGNOSIS — F3131 Bipolar disorder, current episode depressed, mild: Secondary | ICD-10-CM | POA: Diagnosis not present

## 2019-03-23 DIAGNOSIS — F411 Generalized anxiety disorder: Secondary | ICD-10-CM

## 2019-03-23 DIAGNOSIS — F1210 Cannabis abuse, uncomplicated: Secondary | ICD-10-CM | POA: Diagnosis not present

## 2019-03-23 MED ORDER — BUSPIRONE HCL 15 MG PO TABS: 30.0000 mg | ORAL_TABLET | Freq: Two times a day (BID) | ORAL | 0 refills | Status: DC

## 2019-03-23 MED ORDER — OXCARBAZEPINE 300 MG PO TABS: ORAL_TABLET | ORAL | 0 refills | Status: DC

## 2019-03-23 MED ORDER — HYDROXYZINE HCL 25 MG PO TABS: 25.0000 mg | ORAL_TABLET | Freq: Four times a day (QID) | ORAL | 0 refills | Status: DC | PRN

## 2019-03-23 MED ORDER — DIAZEPAM 2 MG PO TABS: 4.0000 mg | ORAL_TABLET | Freq: Three times a day (TID) | ORAL | 0 refills | Status: DC | PRN

## 2019-03-23 NOTE — Progress Notes (Signed)
[facility]  Patient ID: Tristan [name],  MRN: [id number]  PCP: Patient, No Pcp Per  Date of Evaluation: 03/23/2019 Time spent:25 minutes  Chief Complaint:  Chief Complaint    Follow-up     Virtual Visit via Telephone Note  I connected with patient by a video enabled telemedicine application or telephone, with their informed consent, and verified patient privacy and that I am speaking with the correct person using two identifiers.  I am private, in my home and the patient is home.  I discussed the limitations, risks, security and privacy concerns of performing an evaluation and management service by telephone and the availability of in person appointments. I also discussed with the patient that there may be a patient responsible charge related to this service. The patient expressed understanding and agreed to proceed.   I discussed the assessment and treatment plan with the patient. The patient was provided an opportunity to ask questions and all were answered. The patient agreed with the plan and demonstrated an understanding of the instructions.   The patient was advised to call back or seek an in-person evaluation if the symptoms worsen or if the condition fails to improve as anticipated.  I provided 25 minutes of non-face-to-face time during this encounter.  HISTORY/CURRENT STATUS: HPI for 19-month med check.  Tristan [name] states that he has been quite a bit more anxious.  With the coronavirus pandemic in the state of things in the country in the world, the anxiety has been triggered a lot.  The medicines are not working like they used to.  He has not increased the hydroxyzine as we had discussed in the past, because he seemed to be doing better for while.  But since her last visit, a lot has gone on for sure.  He is not really having many depressive symptoms.  He is able to enjoy things when he has a chance.  He is isolating mostly due to the coronavirus pandemic.  Energy  and motivation are pretty good.  Denies suicidal or homicidal thoughts.  He does have some irritability at times but feels that it is mostly related to the anxiety.  As of last week, he is no longer smoking pot.  His wife is quitting smoking cigarettes at the same time, and that has added to the irritability some.  He states he is not having as a harder time stopping the cannabis as she is the cigarettes however.  Reports having some nightmares again.  It is not an every night thing but thoughts of relationship issues have come back.  Not as bad as before but certainly there.  He feels like he is gone backwards a little how he was doing.  Has not seen Dr. Rica Mote since earlier this year because he had been doing better.  Patient denies increased energy with decreased need for sleep, no increased talkativeness, no racing thoughts, no impulsivity or risky behaviors, no increased spending, no increased libido, no grandiosity.  Denies dizziness, syncope, seizures, numbness, tingling, tremor, tics, unsteady gait, slurred speech, confusion. Denies muscle or joint pain, stiffness, or dystonia.  Individual Medical History/ Review of Systems: Changes? :No    Past medications for mental health diagnoses include: Celexa, Lamictal, Lexapro, Seroquel, prazosin, Trileptal, trazodone, Vistaril  Allergies: Patient has no known allergies.  Current Medications:  Current Outpatient Medications:  .  busPIRone (BUSPAR) 15 MG tablet, Take 2 tablets (30 mg total) by mouth 2 (two) times daily., Disp: 270 tablet, Rfl: 0 .  diazepam (VALIUM) 2 MG tablet, Take 2 tablets (4 mg total) by mouth every 8 (eight) hours as needed for anxiety., Disp: 60 tablet, Rfl: 0 .  hydrOXYzine (ATARAX/VISTARIL) 25 MG tablet, Take 1-2 tablets (25-50 mg total) by mouth every 6 (six) hours as needed for anxiety., Disp: 540 tablet, Rfl: 0 .  lamoTRIgine (LAMICTAL) 150 MG tablet, TAKE 1 TABLET BY MOUTH TWICE A DAY, Disp: 180 tablet, Rfl: 1 .   Oxcarbazepine (TRILEPTAL) 300 MG tablet, TAKE 1 TAB EVERY AM AND 2 TABLETS EVERY PM, Disp: 270 tablet, Rfl: 0 Medication Side Effects: none  Family Medical/ Social History: Changes? Yes no longer smoking pot as of last week  MENTAL HEALTH EXAM:  There were no vitals taken for this visit.There is no height or weight on file to calculate BMI.  General Appearance: Unable to assess  Eye Contact:  Unable to assess  Speech:  Clear and Coherent  Volume:  Normal  Mood:  Anxious  Affect:  Unable to assess  Thought Process:  Goal Directed  Orientation:  Full (Time, Place, and Person)  Thought Content: Logical   Suicidal Thoughts:  No  Homicidal Thoughts:  No  Memory:  WNL  Judgement:  Good  Insight:  Good  Psychomotor Activity:  Unable to assess  Concentration:  Concentration: Good  Recall:  Good  Fund of Knowledge: Good  Language: Good  Assets:  Desire for Improvement  ADL's:  Intact  Cognition: WNL  Prognosis:  Good    DIAGNOSES:    ICD-10-CM   1. Generalized anxiety disorder  F41.1   2. PTSD (post-traumatic stress disorder)  F43.10   3. Bipolar affective disorder, currently depressed, mild (HCC)  F31.31   4. Tetrahydrocannabinol (THC) use disorder, mild, abuse  F12.10     Receiving Psychotherapy: No    RECOMMENDATIONS:  I spent 25 minutes with him in 50% of that time was in counseling concerning treatment options for the worsening of anxiety. We discussed several changes to be made, we will increase the BuSpar and increase the hydroxyzine dosage as well as frequency he can take.  He is hesitant in taking the Valium which is understandable but I have reminded him it safe to take more often than he is doing.  Hopefully he will not need it with other changes were making. Encouragement given in regards to quitting cannabis. Increase BuSpar 15 mg, 2 p.o. twice daily.  When he runs out of this current prescription, will send in 30 mg tablets. Continue Valium 2 mg, 2 p.o. every 8  hours as needed anxiety. Increase hydroxyzine 25 mg, 1-2 every 6 hours as needed. Continue 150 mg twice daily. Continue Trileptal 300 mg, 1 every morning and 2 nightly. Restart counseling with Dr. Marliss CzarAndy Mitchum as soon as he can get in. Return in 6 weeks.  Melony Overlyeresa Nathanie Ottley, PA-C   This record has been created using [employer] software.  Chart creation errors have been sought, but may not always have been located and corrected. Such creation errors do not reflect on the standard of medical care.

## 2019-03-25 ENCOUNTER — Ambulatory Visit (INDEPENDENT_AMBULATORY_CARE_PROVIDER_SITE_OTHER): Payer: Self-pay | Admitting: Psychiatry

## 2019-03-25 ENCOUNTER — Ambulatory Visit: Payer: Self-pay | Admitting: Physician Assistant

## 2019-03-25 ENCOUNTER — Other Ambulatory Visit: Payer: Self-pay

## 2019-03-25 DIAGNOSIS — F3131 Bipolar disorder, current episode depressed, mild: Secondary | ICD-10-CM

## 2019-03-25 DIAGNOSIS — F411 Generalized anxiety disorder: Secondary | ICD-10-CM

## 2019-04-20 ENCOUNTER — Other Ambulatory Visit: Payer: Self-pay

## 2019-04-20 ENCOUNTER — Ambulatory Visit (INDEPENDENT_AMBULATORY_CARE_PROVIDER_SITE_OTHER): Payer: 59 | Admitting: Psychiatry

## 2019-04-20 DIAGNOSIS — F3131 Bipolar disorder, current episode depressed, mild: Secondary | ICD-10-CM

## 2019-04-20 DIAGNOSIS — F1210 Cannabis abuse, uncomplicated: Secondary | ICD-10-CM | POA: Diagnosis not present

## 2019-04-20 DIAGNOSIS — F411 Generalized anxiety disorder: Secondary | ICD-10-CM | POA: Diagnosis not present

## 2019-04-20 DIAGNOSIS — Z630 Problems in relationship with spouse or partner: Secondary | ICD-10-CM | POA: Diagnosis not present

## 2019-04-20 NOTE — Progress Notes (Signed)
Psychotherapy Progress Note [facility] Luan Moore, PhD LP  Patient ID: Tristan [name]     MRN: [id number]     Therapy format: Individual psychotherapy Date: 04/20/2019     Start: 8:12a Stop: 9:02a Time Spent: 50 min Location: in-person   Session narrative (presenting needs, interim history, self-report of stressors and symptoms, applications of prior therapy, status changes, and interventions made in session) Somewhat stuck re. art work and activities due to persistent humidity (both hard to work with materials in the shop and uncomfortable in the swelter).  Working at his resistance to doing things like exercise, Working relationship with Tristan [name] is solid, though renewed issue of her being preoccupied with work and largely unavailable during free time.  Lapsing into too much time absorbed in phone and Facebook.  Trying to establish "office hours" with them, but can be drawn back in by "[facility]" about buying vintage toy materials.  Admits also a compulsive shopping impulse, consciously resisted and not considered a problem by wife.  Also realizes he feels lonely, disconnected in the evenings as Tristan [name] may work longer or be preoccupied even while nominally off duty.    Processed wishes for quality time with Tristan [name] in evening.  Could just be shared TV or a movie, or playing a game together.  Notes hx of losing a house in the 2007(-ish) recession, unspecified story of [employer] acting unfairly.  That history has probably added some anxiety for the two of them, maybe more particularly for Tristan [name] (self-imposed?) to be the very reliable primary breadwinner.  Acknowledged, resolved to ask Tristan [name] directly and matter-of-factly for a reservation of evening time, say, for a board game date, specifying date and time rather than leaving it to chance, spontaneity, her preoccupation, or his conflict avoidance to set up yet another frustration.  Agrees.  Provoked momentarily sighting his nephew Tristan [name]  yesterday, in traffic near the house.  Momentarily pissed off but didn't act on it.  Clear enough it's not worth engaging.  Meanwhile, taking on dissolution of a 20-year friendship -- Tristan [name], who has history of stealing from his employers, became untrusted by wife, and seems to be gravitating toward right-wing extremism.  Hard to stomach.  Trigger event in Tristan [name], posting a [employer], saying increasingly strident, callous, anarchic things, and then having very touchy reactions to PT inquiring about it and simply saying he doesn't agree with that stuff.  Quickly became a mutual cursing out online.  Insight in session how this kind of incident with a longterm friend echoes contentiousness and loss in PT's own family, good reason to feel passionately.  Also counts friend Tristan [name], who became a wife-beating addict, as a relationship loss in recent times, and now sees another friend Tristan [name] losing out to addiction.  Able to acknowledge grief and disappointment for them.  Normalized irritability as fighting the sadness of it and frustrating the idea that he can just have enduring connections without strife or complications.  MJ use episodic -- 1 week using some, 1 week quitting firmly.  No signs of psychosis or affective loosening because of it, but continuing caution about potential to dull concentration and set up irritability.  Therapeutic modalities: Cognitive Behavioral Therapy and Insight-Oriented  Mental Status/Observations:  Appearance:   Casual     Behavior:  Appropriate  Motor:  Normal  Speech/Language:   Clear and Coherent  Affect:  Appropriate  Mood:  down, some  Thought process:  normal  Thought content:    WNL  Sensory/Perceptual disturbances:    WNL  Orientation:  grossly intact  Attention:  Good  Concentration:  Good  Memory:  WNL  Insight:    Good  Judgment:   Good  Impulse Control:  Fair   Risk Assessment: Danger to Self: No Self-injurious Behavior: No Danger to Others:  No Physical Aggression / Violence: No Duty to Warn: No Access to Firearms a concern: No  Assessment of progress:  stabilized  Diagnosis:   ICD-10-CM   1. Bipolar affective disorder, currently depressed, mild (HCC)  F31.31   2. Generalized anxiety disorder  F41.1   3. Tetrahydrocannabinol (THC) use disorder, mild, abuse  F12.10   4. Relationship problem between partners  Z63.0    Plan:  . [facility] . Self-affirm excess anger with friend losses as related to family loss/rift . Consider putting up with some humid outdoors to get the stress benefits of exercise . Other recommendations/advice as noted above . Continue to utilize previously learned skills ad lib . Maintain medication as prescribed and work faithfully with relevant prescriber(s) if any changes are desired or seem indicated . Call the clinic on-call service, present to ER, or call 911 if any life-threatening psychiatric crisis Return in about 6 weeks (around 06/01/2019).  Robley Friesobert Natayah Warmack, PhD Marliss CzarAndy Bresha Hosack, PhD LP Clinical Psychologist, [facility] [address], [address] [place], [place] [address] [phone]([phone]

## 2019-05-05 ENCOUNTER — Ambulatory Visit: Payer: Self-pay | Admitting: Physician Assistant

## 2019-05-13 NOTE — Progress Notes (Signed)
Psychotherapy Progress Note [facility] Luan Moore, PhD LP  Patient ID: Tristan [name]     MRN: [id number]     Therapy format: Individual psychotherapy Date: 03/25/2019     Start: c. 8:15am Stop: c. 9am Time Spent: c. 45 min Location: in-person   Note reconstructed after data discovered missing from EHR for encounter known to have noted and closed. In view of lacking information, charge waived for this encounter.  (AM)  Session narrative (presenting needs, interim history, self-report of stressors and symptoms, applications of prior therapy, status changes, and interventions made in session)  First visit since before COVID.  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  Mental Status/Observations: unavailable due to data loss, typically cogent  Risk Assessment: unavailable due to data loss, no actionable risk present  Assessment of progress:  unavailable due to data loss, recalled stable  Diagnosis:   ICD-10-CM   1. Generalized anxiety disorder  F41.1   2. Bipolar affective disorder, currently depressed, mild ([place])  F31.31     Plan:  . Current recommendations/advice unavailable due to data loss . Continue to utilize previously learned skills ad lib . Maintain medication as prescribed and work faithfully with relevant prescriber(s) if any changes are desired or seem indicated . Call the clinic on-call service, present to ER, or call 911 if any life-threatening psychiatric crisis Return for time as available.  Blanchie Serve, PhD Luan Moore, PhD LP Clinical Psychologist, [facility] [address] [place], [place] [address] [phone]

## 2019-05-28 ENCOUNTER — Other Ambulatory Visit: Payer: Self-pay | Admitting: Physician Assistant

## 2019-05-31 ENCOUNTER — Ambulatory Visit (INDEPENDENT_AMBULATORY_CARE_PROVIDER_SITE_OTHER): Payer: 59 | Admitting: Psychiatry

## 2019-05-31 ENCOUNTER — Other Ambulatory Visit: Payer: Self-pay

## 2019-05-31 DIAGNOSIS — F411 Generalized anxiety disorder: Secondary | ICD-10-CM

## 2019-05-31 DIAGNOSIS — F3131 Bipolar disorder, current episode depressed, mild: Secondary | ICD-10-CM

## 2019-05-31 DIAGNOSIS — Z630 Problems in relationship with spouse or partner: Secondary | ICD-10-CM

## 2019-05-31 DIAGNOSIS — G4700 Insomnia, unspecified: Secondary | ICD-10-CM

## 2019-05-31 NOTE — Progress Notes (Signed)
Psychotherapy Progress Note [facility] Luan Moore, PhD LP  Patient ID: Tristan [name]     MRN: [id number]     Therapy format: Individual psychotherapy Date: 05/31/2019     Start: 8:12a Stop: 9:02a Time Spent: 50 min Location: in-person   Session narrative (presenting needs, interim history, self-report of stressors and symptoms, applications of prior therapy, status changes, and interventions made in session) "Challenging."  COVID restrictions frustrate socialization, weather frustrates priming works of art, big toy shows called off for the winter, and his art work does not translate well to video.  Wife consumed with projects on weekends, not so much available time together, gets lonely, irritated.  Discussed options for art and COVID concerns -- suggested drive to [facility] to peruse three toy stores, keeping option to not go in if it looks poor adherence to pandemic strategies.  Discussed if he is caught waiting at home for food delivery and dealing with annoyingly ambivalent feelings.  Resolved to read comics as an interesting, art-relevant, easily changed activity.  Discussed attributional style, advocated more win-win thinking for dilemmas he encounters.    Wife turns 27 next week, will have some folks over.  Concerned how often he is taking both hydroxyzine and diazepam.  7-9 hydroxyzine/day, 1/2-1 QOD and 1/wk 2/day.  Clear already that diazepam has addiction risk and judgment risk.  Hydroxyzine seems to be powering appetite.  Discussed application of breathing technique, recommended daily practice.  For more intense anxiety, offered ice-holding technique.    Consciously trying to be more generous this year.    Somewhat stuck re. art work and activities due to persistent humidity (both hard to work with materials in the shop and uncomfortable in the swelter).  Working at his resistance to doing things like exercise. Working relationship with [name] is solid,  though renewed issue of her being preoccupied with work and largely unavailable during free time.  Lapsing into too much time absorbed in phone and Facebook.  Trying to establish "office hours" with them, but can be drawn back in by "[facility]" about buying vintage toy materials.  Admits also a compulsive shopping impulse, consciously resisted and not considered a problem by wife.  Also realizes he feels lonely, disconnected in the evenings as [name] may work longer or be preoccupied even while nominally off duty.    Processed wishes for quality time with [name] in evening.  Could just be shared TV or a movie, or playing a game together.  Notes hx of losing a house in the 2007(-ish) recession, unspecified story of [employer] acting unfairly.  That history has probably added some anxiety for the two of them, maybe more particularly for [name] (self-imposed?) to be the very reliable primary breadwinner.  Acknowledged, resolved to ask [name] directly and matter-of-factly for a reservation of evening time, say, for a board game date, specifying date and time rather than leaving it to chance, spontaneity, her preoccupation, or his conflict avoidance to set up yet another frustration.  Agrees.  Provoked momentarily sighting his nephew Maylon Cos yesterday, in traffic near the house.  Momentarily pissed off but didn't act on it.  Clear enough it's not worth engaging.  Meanwhile, taking on dissolution of a 20-year friendship -- Antony Haste, who has history of stealing from his employers, became untrusted by wife, and seems to be gravitating toward right-wing extremism.  Hard to stomach.  Trigger event in Antony Haste, posting a [employer], saying increasingly strident, callous, anarchic things, and then having very touchy  reactions to PT inquiring about it and simply saying he doesn't agree with that stuff.  Quickly became a mutual cursing out online.  Insight in session how this kind of incident with a longterm friend echoes contentiousness  and loss in PT's own family, good reason to feel passionately.  Also counts friend Andrey Campanile, who became a wife-beating addict, as a relationship loss in recent times, and now sees another friend Nida Boatman losing out to addiction.  Able to acknowledge grief and disappointment for them.  Normalized irritability as fighting the sadness of it and frustrating the idea that he can just have enduring connections without strife or complications.  MJ use episodic -- 1 week using some, 1 week quitting firmly.  No signs of psychosis or affective loosening because of it, but continuing caution about potential to dull concentration and set up irritability.  Therapeutic modalities: Cognitive Behavioral Therapy and Insight-Oriented  Mental Status/Observations:  Appearance:   Casual     Behavior:  Appropriate  Motor:  Normal  Speech/Language:   Clear and Coherent  Affect:  Appropriate  Mood:  down, some  Thought process:  normal  Thought content:    WNL  Sensory/Perceptual disturbances:    WNL  Orientation:  grossly intact  Attention:  Good  Concentration:  Good  Memory:  WNL  Insight:    Good  Judgment:   Good  Impulse Control:  Fair   Risk Assessment: Danger to Self: No Self-injurious Behavior: No Danger to Others: No Physical Aggression / Violence: No Duty to Warn: No Access to Firearms a concern: No  Assessment of progress:  stabilized  Diagnosis:   ICD-10-CM   1. Bipolar affective disorder, currently depressed, mild (HCC)  F31.31   2. Generalized anxiety disorder  F41.1   3. Relationship problem between partners  Z63.0   4. Insomnia, unspecified type  G47.00      Plan:  . Schedule an evening date with [facility] . Self-affirm excess anger with friend losses as related to family loss/rift . Consider putting up with some humid outdoors to get the stress benefits of exercise . Consider recon trip to [facility] for [employer] . Other recommendations/advice as noted above . Continue to utilize  previously learned skills ad lib . Maintain medication as prescribed and work faithfully with relevant prescriber(s) if any changes are desired or seem indicated . Call the clinic on-call service, present to ER, or call 911 if any life-threatening psychiatric crisis Return in about 6 weeks (around 07/12/2019).  [name], PhD [name], PhD LP Clinical Psychologist, [facility] [address] [place], [place] [address] [phone]

## 2019-06-01 ENCOUNTER — Ambulatory Visit: Payer: 59 | Admitting: Physician Assistant

## 2019-06-15 ENCOUNTER — Other Ambulatory Visit: Payer: Self-pay | Admitting: Physician Assistant

## 2019-06-21 ENCOUNTER — Other Ambulatory Visit: Payer: Self-pay

## 2019-06-21 ENCOUNTER — Ambulatory Visit (INDEPENDENT_AMBULATORY_CARE_PROVIDER_SITE_OTHER): Payer: 59 | Admitting: Physician Assistant

## 2019-06-21 ENCOUNTER — Encounter: Payer: Self-pay | Admitting: Physician Assistant

## 2019-06-21 DIAGNOSIS — F4310 Post-traumatic stress disorder, unspecified: Secondary | ICD-10-CM

## 2019-06-21 DIAGNOSIS — F411 Generalized anxiety disorder: Secondary | ICD-10-CM

## 2019-06-21 DIAGNOSIS — F3131 Bipolar disorder, current episode depressed, mild: Secondary | ICD-10-CM | POA: Diagnosis not present

## 2019-06-21 MED ORDER — BUSPIRONE HCL 15 MG PO TABS: ORAL_TABLET | ORAL | 1 refills | Status: DC

## 2019-06-21 NOTE — Progress Notes (Signed)
[facility] Med Check  Patient ID: Tristan [name],  MRN: [id number]  PCP: Patient, No Pcp Per  Date of Evaluation: 06/21/2019 Time spent:15 minutes  Chief Complaint:  Chief Complaint    Follow-up      HISTORY/CURRENT STATUS: HPI for routine med check.  At the last visit, we increased the BuSpar.  It was helpful initially but seems to have plateaued.  A few weeks ago, he was needing more of the hydroxyzine, and then that made him "foggy headed."  He is not needing that as much but wonders if we can increase the BuSpar as we have discussed in the past.  He is not really having a lot of panic attacks although he does some.  It is mostly a generalized sense of anxiety.  He does take the Valium but uses the hydroxyzine first when he needs something for rescue.  The stress of the coronavirus pandemic is definitely an issue but he is handling it as well as possible.  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  That of course has affected him financially but also creatively.  He does not have the drive that he used to, to create new things.  He is trying to continue to work some even though he does not have any shows to go to.  He denies increased energy with decreased need for sleep.  He does still have some irritability at times but for the most part it is controlled.  No impulsivity or risky behaviors.  No increased spending or libido.  No grandiosity.  Not having a lot of depressive symptoms right now.  He is able to enjoy things sometimes.  He sleeps fairly well, not getting too much or too little most nights.  Not isolating more than normal.  Not crying easily.  Energy and motivation are good.  No suicidal or homicidal thoughts.  Denies dizziness, syncope, seizures, numbness, tingling, tremor, tics, unsteady gait, slurred speech, confusion. Denies muscle or joint pain, stiffness, or dystonia.  Individual  Medical History/ Review of Systems: Changes? :No    Past medications for mental health diagnoses include: Celexa, Lamictal, Lexapro, Seroquel, prazosin, Trileptal, trazodone, Vistaril  Allergies: Patient has no known allergies.  Current Medications:  Current Outpatient Medications:  .  diazepam (VALIUM) 2 MG tablet, Take 2 tablets (4 mg total) by mouth every 8 (eight) hours as needed for anxiety., Disp: 60 tablet, Rfl: 0 .  hydrOXYzine (ATARAX/VISTARIL) 25 MG tablet, Take 1-2 tablets (25-50 mg total) by mouth every 6 (six) hours as needed for anxiety., Disp: 540 tablet, Rfl: 0 .  lamoTRIgine (LAMICTAL) 150 MG tablet, TAKE 1 TABLET BY MOUTH TWICE A DAY, Disp: 180 tablet, Rfl: 1 .  Oxcarbazepine (TRILEPTAL) 300 MG tablet, TAKE 1 TAB EVERY AM AND 2 TABLETS EVERY PM, Disp: 270 tablet, Rfl: 0 .  busPIRone (BUSPAR) 15 MG tablet, 3 po q am, 2 q hs., Disp: 450 tablet, Rfl: 1 Medication Side Effects: none  Family Medical/ Social History: Changes? No  MENTAL HEALTH EXAM:  There were no vitals taken for this visit.There is no height or weight on file to calculate BMI.  General Appearance: Casual, Neat and Well Groomed  Eye Contact:  Good  Speech:  Clear and Coherent  Volume:  Normal  Mood:  Euthymic  Affect:  Appropriate  Thought Process:  Goal Directed and Descriptions of Associations: Intact  Orientation:  Full (Time, Place, and  Person)  Thought Content: Logical   Suicidal Thoughts:  No  Homicidal Thoughts:  No  Memory:  WNL  Judgement:  Good  Insight:  Good  Psychomotor Activity:  Normal  Concentration:  Concentration: Good  Recall:  Good  Fund of Knowledge: Good  Language: Good  Assets:  Desire for Improvement  ADL's:  Intact  Cognition: WNL  Prognosis:  Good    DIAGNOSES:    ICD-10-CM   1. Generalized anxiety disorder  F41.1   2. Bipolar affective disorder, currently depressed, mild (HCC)  F31.31   3. PTSD (post-traumatic stress disorder)  F43.10     Receiving  Psychotherapy: Yes With Dr. [name]   RECOMMENDATIONS:  We discussed the anxiety.  The typical maximum dose for BuSpar is 60 mg total a day, however occasionally we do go higher than that and I think it will benefit him to do so.  It should not cause any side effects. Increase BuSpar 15 mg to 3 p.o. every morning and 2 nightly. Continue Valium 2 mg, 2 p.o. every 8 hours as needed.  PDMP was reviewed. Continue hydroxyzine 25 to 50 mg every 6 hours as needed. Continue Lamictal 150 mg 1 p.o. twice daily. Continue Trileptal 300 mg, 1 p.o. every morning and 2 nightly. Continue therapy with Dr. Farrel Demark. Return in 4 weeks.  Melony Overly, PA-C

## 2019-07-04 ENCOUNTER — Other Ambulatory Visit: Payer: Self-pay | Admitting: Physician Assistant

## 2019-07-11 ENCOUNTER — Other Ambulatory Visit: Payer: Self-pay

## 2019-07-11 ENCOUNTER — Ambulatory Visit (INDEPENDENT_AMBULATORY_CARE_PROVIDER_SITE_OTHER): Payer: 59 | Admitting: Psychiatry

## 2019-07-11 DIAGNOSIS — Z8659 Personal history of other mental and behavioral disorders: Secondary | ICD-10-CM | POA: Diagnosis not present

## 2019-07-11 DIAGNOSIS — F411 Generalized anxiety disorder: Secondary | ICD-10-CM | POA: Diagnosis not present

## 2019-07-11 DIAGNOSIS — F3131 Bipolar disorder, current episode depressed, mild: Secondary | ICD-10-CM | POA: Diagnosis not present

## 2019-07-11 DIAGNOSIS — Z630 Problems in relationship with spouse or partner: Secondary | ICD-10-CM

## 2019-07-11 NOTE — Progress Notes (Signed)
Psychotherapy Progress Note [facility] Tristan Czar, PhD LP  Patient ID: Tristan [name]     MRN: [id number]     Therapy format: Individual psychotherapy Date: 07/11/2019     Start: 10:15a Stop: 11:02a Time Spent: 47 min Location: in-person   Session narrative (presenting needs, interim history, self-report of stressors and symptoms, applications of prior therapy, status changes, and interventions made in session) Despite things going well, finds himself feeling intrusive sadness and anxiety at times, wonders why.  Interpreted seasonal reminders of loss, division, and pain during family-intensive time of the year, prone to feel wishes again and go back through them, at least emotionally.  Encouraged to let the moment speak, ask self if there is any "unfinished business" he is feeling when these happen, and acknowledge, possibly journal before trying to switch attention or otherwise avoid feeling them.  "Progress" in anger management -- unexpectedly saw nephew last week and didn't try to lash out at him.  restless for a day, but no raging behaviors.  Birthday yesterday, for which he typically wants to be thought of and celebrated, and which usually coincides with a very large gathering of artists in [place] (including a number of friends).  Show was cancelled locally, but smaller replacement staged in [place], which he made it to and enjoyed as a pleasant surprise versus losing it entirely.  No cards, but 60+ people from [employer] offered well wishes and warm recollections of some edgy commentary he made in a podcast 3 years ago.  Downside, Tristan [name]'s lack of planning for his birthday, asking him Friday (seemingly lamely) whether he wanted a cake (yes), which took till 7pm last night to do.  By the same token, understanding how she struggles this season with loss, of her father in particular, and her own family alienation, realized more acutely in the past year.  Still difficult to  secure time together, for which he more or less blew up at her last Monday, but fond himself decisively quicker to rethink anger and come back to it to reframe as hurt.  Tristan [name] understanding.   Asked how he has equipped himself to be more flexible about frustrations, claims having planned better for his days, staying humane about tasks he asks of himself.  More routine for him to clean house, also, which helps set a more inviting environment.  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  Consequently, a flow of more obvious things to do energizes him better when it comes time to go into the studio to create, much less inertia experienced.    Overall, morale fair-good.  Some apprehension as W plans, uncharacteristically and out of practice, to cook for [employer].  Inviting elder sister Tristan [name] and her kids, 2 adult friends.  Figures her to become stressed about doing well enough at it to honor her father's cooking legacy and please people, and expects to hear cursing and pot-slamming that are abrasive to his senses.  Discussed getting ahead of it, first by sincerely setting humane expectations (let her know neither he nor she need expect perfection, she is not on the hook for that) and then by preparing to playfully join her in cursing and outrage in a way that makes it a game, a release, not an implied conflict.  Agrees, will present to her.   Therapeutic modalities: Cognitive Behavioral Therapy,  Assertiveness/Communication, Solution-Oriented/Positive Psychology and Ego-Supportive  Mental Status/Observations:  Appearance:   Casual   comic superhero mask  Behavior:  Appropriate  Motor:  Normal  Speech/Language:   Clear and Coherent  Affect:  Appropriate  Mood:  normal  Thought process:  normal  Thought content:    worry/concern   Sensory/Perceptual disturbances:    WNL  Orientation:  Fully oriented  Attention:  Good  Concentration:  Good  Memory:  WNL  Insight:    Good  Judgment:   Good  Impulse Control:  Good   Risk Assessment: Danger to Self: No Self-injurious Behavior: No Danger to Others: No Physical Aggression / Violence: No Duty to Warn: No Access to Firearms a concern: No  Assessment of progress:  progressing  Diagnosis:   ICD-10-CM   1. Generalized anxiety disorder  F41.1   2. Bipolar affective disorder, currently depressed, mild ([place])  F31.31   3. Relationship problem between partners  Z63.0   4. History of posttraumatic stress disorder (PTSD)  Z86.59     Plan:  . Self-affirm intrusive moods as legitimate emotional responses to history and reminders, acknowledge when issues recognizable rather than avoid . Humane expectations and responses to frustration conversation with [place] . Other recommendations/advice as noted above . Continue to utilize previously learned skills ad lib . Maintain medication as prescribed and work faithfully with relevant prescriber(s) if any changes are desired or seem indicated . Call the clinic on-call service, present to ER, or call 911 if any life-threatening psychiatric crisis Return in about 1 month (around 08/10/2019). Current [facility] appointments: Future Appointments  Date Time Provider [place]  07/21/2019  9:30 AM Tristan Lank, PA-C CP-CP None  08/16/2019  9:00 AM Tristan Serve, PhD CP-CP None    Tristan Serve, PhD Tristan Moore, PhD LP Clinical Psychologist, [facility] [address] [place], [place] [address] [phone]

## 2019-07-21 ENCOUNTER — Ambulatory Visit: Payer: 59 | Admitting: Physician Assistant

## 2019-07-21 ENCOUNTER — Telehealth: Payer: Self-pay | Admitting: Physician Assistant

## 2019-07-21 NOTE — Telephone Encounter (Signed)
RTC to wife, Benjamine Mola and went over his medication instructions to confirm. Verbalized understanding. No refills needed at this time.

## 2019-07-21 NOTE — Telephone Encounter (Signed)
Spouse Benjamine Mola called to get clarification on medication directions.

## 2019-07-22 ENCOUNTER — Other Ambulatory Visit: Payer: Self-pay | Admitting: Physician Assistant

## 2019-07-31 ENCOUNTER — Other Ambulatory Visit: Payer: Self-pay | Admitting: Physician Assistant

## 2019-07-31 NOTE — Telephone Encounter (Signed)
Okay for 7/20

## 2019-08-05 ENCOUNTER — Other Ambulatory Visit: Payer: Self-pay | Admitting: Physician Assistant

## 2019-08-16 ENCOUNTER — Ambulatory Visit (INDEPENDENT_AMBULATORY_CARE_PROVIDER_SITE_OTHER): Payer: 59 | Admitting: Psychiatry

## 2019-08-16 DIAGNOSIS — F319 Bipolar disorder, unspecified: Secondary | ICD-10-CM

## 2019-08-16 NOTE — Progress Notes (Signed)
Admin note for non-service contact  Patient ID: Tristan [name]  MRN: [id number] DATE: 08/16/2019  Scheduled for 9am today, did not get message about [place] on home restriction (suspected COVID) in time to prevent coming to the office.  Reached at home by phone, where he expresses particular discomfort with teletherapy.  Offered and accepted to reschedule.  No penalty for [facility].  Blanchie Serve, PhD Luan Moore, PhD LP Clinical Psychologist, [facility] [address] [place], [place] [address] [phone]

## 2019-08-17 ENCOUNTER — Telehealth: Payer: Self-pay | Admitting: Physician Assistant

## 2019-08-17 NOTE — Telephone Encounter (Signed)
Pt would like his Valium placed on file at [facility]. He stated he isn't out as of yet, but will be before 2/1 appt.

## 2019-08-22 ENCOUNTER — Other Ambulatory Visit: Payer: Self-pay

## 2019-08-22 MED ORDER — DIAZEPAM 2 MG PO TABS: 4.0000 mg | ORAL_TABLET | Freq: Three times a day (TID) | ORAL | 0 refills | Status: DC | PRN

## 2019-08-22 NOTE — Telephone Encounter (Signed)
Pended for approval to be submitted

## 2019-08-22 NOTE — Telephone Encounter (Signed)
Yes please

## 2019-08-29 ENCOUNTER — Telehealth: Payer: Self-pay | Admitting: Physician Assistant

## 2019-08-29 NOTE — Telephone Encounter (Signed)
I called and spoke with the patient's wife, Tristan [name].  Patient has a 10-day or so history of pain in his right calf.  No known injury.  No new exercise program.  Initially, he and his wife thought it was a muscle cramp.  He ate more bananas trying to get more potassium, and had no relief.  Then 2 days ago, while he was getting dressed and it sounds like flexing his foot, he was in such severe pain that he was in tears.  He saw someone at a [facility] who wanted to get labs and have him see PCP.  His usual PCP at [facility] has left.  [name] did call their office today to get an appointment with someone, but they are unable to see him until Wednesday.  Because this could potentially be a blood clot therefore life-threatening, I recommend that she take him to [facility] urgent care or any urgent care for that matter, and they will send him to the emergency room if appropriate.  This is not something that can wait 2 days.  Tristan [name] understands and agrees to the above plan. And to answer her question, I do not think this is related to any of his psychiatric medicines.

## 2019-08-29 NOTE — Telephone Encounter (Signed)
Patient 's wife called and said that Tristan [name] is having pain in his right leg. They went to the medic clinic and they are sending him to a specialist since they think it is more serious. Is this due to his mediciations? Please call the wife at [phone]

## 2019-09-10 ENCOUNTER — Other Ambulatory Visit: Payer: Self-pay | Admitting: Physician Assistant

## 2019-09-19 ENCOUNTER — Ambulatory Visit (INDEPENDENT_AMBULATORY_CARE_PROVIDER_SITE_OTHER): Payer: 59 | Admitting: Physician Assistant

## 2019-09-19 ENCOUNTER — Other Ambulatory Visit: Payer: Self-pay

## 2019-09-19 ENCOUNTER — Ambulatory Visit (INDEPENDENT_AMBULATORY_CARE_PROVIDER_SITE_OTHER): Payer: 59 | Admitting: Psychiatry

## 2019-09-19 ENCOUNTER — Encounter: Payer: Self-pay | Admitting: Physician Assistant

## 2019-09-19 DIAGNOSIS — F4310 Post-traumatic stress disorder, unspecified: Secondary | ICD-10-CM | POA: Diagnosis not present

## 2019-09-19 DIAGNOSIS — F319 Bipolar disorder, unspecified: Secondary | ICD-10-CM | POA: Diagnosis not present

## 2019-09-19 DIAGNOSIS — F411 Generalized anxiety disorder: Secondary | ICD-10-CM

## 2019-09-19 DIAGNOSIS — Z630 Problems in relationship with spouse or partner: Secondary | ICD-10-CM

## 2019-09-19 DIAGNOSIS — F3132 Bipolar disorder, current episode depressed, moderate: Secondary | ICD-10-CM

## 2019-09-19 MED ORDER — DIAZEPAM 2 MG PO TABS: 2.0000 mg | ORAL_TABLET | Freq: Three times a day (TID) | ORAL | 1 refills | Status: DC | PRN

## 2019-09-19 NOTE — Progress Notes (Signed)
Psychotherapy Progress Note [facility] [name], PhD LP  Patient ID: Tristan [name]     MRN: [id number] Therapy format: Individual psychotherapy Date: 09/19/2019      Start: 2:18p     Stop: 3:04p     Time Spent: 46 min Location: In-person   Session narrative (presenting needs, interim history, self-report of stressors and symptoms, applications of prior therapy, status changes, and interventions made in session) Has injured hamstring, 4 weeks recovering.  Recognizes he has no long or strong friendships, which sets him up to overinvest.  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  Last year was the last one (2/14) before pandemic, and this year's is going virtual, which goes very much against his instincts.  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  Dec 7 decided to take a break from social media, about a month later took suggestion to let friends know.  More recently, feeling lack of creativity, more of a distaste for the whole enterprise.  Has been able to reach out to a couple acquaintances.  Had a couple deaths in January -- spry old neighbor and close friend Annette Stable (brain cancer), [name]'s aunt Meriam Sprague (most fond inlaw).  C/o creative block.  Introduced the idea of writing through writer's block by writing about writer's block, idea to create a representation of what creative block feels like -- could be drawn, shaped with clay, molded, painted, or any mode of art he sees fit.  Encouraged that any expression is opening up the expressive capacity, feeling blocked is very salient material, and acknowledging it by doing art with it could very well un-stick it.  Marriage stable, communications relatively undistressed.  Therapeutic modalities: Cognitive Behavioral Therapy and  Solution-Oriented/Positive Psychology  Mental Status/Observations:  Appearance:   Casual     Behavior:  Appropriate  Motor:  Normal  Speech/Language:   Clear and Coherent  Affect:  Appropriate  Mood:  dysthymic  Thought process:  normal  Thought content:    WNL  Sensory/Perceptual disturbances:    WNL  Orientation:  Fully oriented  Attention:  Good  Concentration:  Good  Memory:  WNL  Insight:    Good  Judgment:   Good  Impulse Control:  Good   Risk Assessment: Danger to Self: No Self-injurious Behavior: No Danger to Others: No Physical Aggression / Violence: No Duty to Warn: No Access to Firearms a concern: No  Assessment of progress:  progressing  Diagnosis:   ICD-10-CM   1. Bipolar affective disorder, currently depressed, moderate (HCC)  F31.32   2. Relationship problem between partners  Z63.0   3. PTSD (post-traumatic stress disorder)  F43.10    Plan:  . Express creative block through art or writing, any way that suits, to unblock . Other recommendations/advice as may be noted above . Continue to utilize previously learned skills ad lib . Maintain medication as prescribed and work faithfully with relevant prescriber(s) if any changes are desired or seem indicated . Call the clinic on-call service, present to ER, or call 911 if any life-threatening psychiatric crisis . Return late March..  Next scheduled visit in this office Visit date not found.  [name], PhD [name], PhD LP Clinical Psychologist, [facility] [address], [address]  [place], [place] [address] ([phone]

## 2019-09-19 NOTE — Progress Notes (Signed)
[facility]  Patient ID: Tristan [name],  MRN: [id number]  PCP: Patient, No Pcp Per  Date of Evaluation: 09/19/2019 Time spent:20 minutes  Chief Complaint:  Chief Complaint    Anxiety; Depression; Insomnia     Virtual Visit via Telephone Note  I connected with patient by a video enabled telemedicine application or telephone, with their informed consent, and verified patient privacy and that I am speaking with the correct person using two identifiers.  I am private, in my office and the patient is home.   I discussed the limitations, risks, security and privacy concerns of performing an evaluation and management service by telephone and the availability of in person appointments. I also discussed with the patient that there may be a patient responsible charge related to this service. The patient expressed understanding and agreed to proceed.   I discussed the assessment and treatment plan with the patient. The patient was provided an opportunity to ask questions and all were answered. The patient agreed with the plan and demonstrated an understanding of the instructions.   The patient was advised to call back or seek an in-person evaluation if the symptoms worsen or if the condition fails to improve as anticipated.  I provided 20 minutes of non-face-to-face time during this encounter.  HISTORY/CURRENT STATUS: HPI For routine med check.   Patient states that he has been a bit more anxious than he usually is.  "This is always a bad time of year for me.  I have more time to think about things."  He has a generalized sense of something bad happening.  He does have panicky feelings on an almost daily basis.  The Valium does help but he is hesitant to take it.  He does not want to become tolerant or addicted to it.  He takes the hydroxyzine usually in the evenings only because it does make him a little foggy headed.  It does help him go to sleep though.  He is able to enjoy  some things but energy and motivation are low.  Again, he states it is partly due to the time of year.  He does not cry easily.  Memory is about the same and is stable.  He denies suicidal or homicidal thoughts.  Patient denies increased energy with decreased need for sleep, no increased talkativeness, no racing thoughts, no impulsivity or risky behaviors, no increased spending, no increased libido, no grandiosity.  Denies dizziness, syncope, seizures, numbness, tingling, tremor, tics, unsteady gait, slurred speech, confusion.   Individual Medical History/ Review of Systems: Changes? :Yes See previous phone notes.  He was found to have spasms in his right calf.  He still having some pain there but it is better than it was several weeks back.  Past medications for mental health diagnoses include: Celexa, Lamictal, Lexapro, Seroquel, prazosin, Trileptal, trazodone, Vistaril  Allergies: Patient has no known allergies.  Current Medications:  Current Outpatient Medications:  .  busPIRone (BUSPAR) 15 MG tablet, 3 po q am, 2 q hs., Disp: 450 tablet, Rfl: 1 .  diazepam (VALIUM) 2 MG tablet, Take 1 tablet (2 mg total) by mouth every 8 (eight) hours as needed for anxiety., Disp: 90 tablet, Rfl: 1 .  hydrOXYzine (ATARAX/VISTARIL) 25 MG tablet, TAKE 1-2 TABLETS (25-50 MG TOTAL) BY MOUTH EVERY 6 (SIX) HOURS AS NEEDED FOR ANXIETY., Disp: 240 tablet, Rfl: 5 .  lamoTRIgine (LAMICTAL) 150 MG tablet, TAKE 1 TABLET BY MOUTH TWICE A DAY, Disp: 180 tablet, Rfl: 0 .  Oxcarbazepine (TRILEPTAL) 300 MG tablet, TAKE 1 TAB EVERY AM AND 2 TABLETS EVERY PM, Disp: 270 tablet, Rfl: 0 Medication Side Effects: none  Family Medical/ Social History: Changes? No  MENTAL HEALTH EXAM:  There were no vitals taken for this visit.There is no height or weight on file to calculate BMI.  General Appearance: Unable to assess  Eye Contact:  Unable to assess  Speech:  Clear and Coherent  Volume:  Normal  Mood:  Euthymic  Affect:   Unable to assess  Thought Process:  Goal Directed and Descriptions of Associations: Intact  Orientation:  Full (Time, Place, and Person)  Thought Content: Logical   Suicidal Thoughts:  No  Homicidal Thoughts:  No  Memory:  WNL  Judgement:  Good  Insight:  Good  Psychomotor Activity:  Unable to assess  Concentration:  Concentration: Good  Recall:  Good  Fund of Knowledge: Good  Language: Good  Assets:  Desire for Improvement  ADL's:  Intact  Cognition: WNL  Prognosis:  Good    DIAGNOSES:    ICD-10-CM   1. Generalized anxiety disorder  F41.1   2. Bipolar I disorder (HCC)  F31.9   3. PTSD (post-traumatic stress disorder)  F43.10     Receiving Psychotherapy: Yes  Dr. Mardelle Matte Mitchum   RECOMMENDATIONS:  PDMP was reviewed. I spent 20 minutes with him. We discussed different treatment options for the anxiety.  He would prefer to take the Valium and/or the hydroxyzine more often to see if that is beneficial before we change any of the other medications around.  I think this is a good decision.  He is on such a low dose of Valium and is always hesitant to take it, I do not think there is any risk of abuse to be concerned about. Increase Valium 2 mg, 1 p.o. 3 times daily as needed.  (This has been the direction all along that he rarely uses over 1 or 2 a day at the most.). Increase hydroxyzine 25 mg, 1-2 every 6 hours as needed.  (Same directions but have encouraged him to take more as needed.) Continue BuSpar 15 mg, 3 p.o. every morning and 2 p.o. nightly. Continue Lamictal 150 mg 1 p.o. twice daily. Continue Trileptal 300 mg, 1 every morning and 2 nightly. Continue therapy with Dr. [name] Return in 6 weeks.  Melony Overly, PA-C

## 2019-10-29 ENCOUNTER — Other Ambulatory Visit: Payer: Self-pay | Admitting: Physician Assistant

## 2019-11-14 ENCOUNTER — Encounter: Payer: Self-pay | Admitting: Physician Assistant

## 2019-11-14 ENCOUNTER — Ambulatory Visit (INDEPENDENT_AMBULATORY_CARE_PROVIDER_SITE_OTHER): Payer: 59 | Admitting: Physician Assistant

## 2019-11-14 ENCOUNTER — Other Ambulatory Visit: Payer: Self-pay

## 2019-11-14 ENCOUNTER — Ambulatory Visit (INDEPENDENT_AMBULATORY_CARE_PROVIDER_SITE_OTHER): Payer: 59 | Admitting: Psychiatry

## 2019-11-14 DIAGNOSIS — Z8659 Personal history of other mental and behavioral disorders: Secondary | ICD-10-CM

## 2019-11-14 DIAGNOSIS — F319 Bipolar disorder, unspecified: Secondary | ICD-10-CM | POA: Diagnosis not present

## 2019-11-14 DIAGNOSIS — G4700 Insomnia, unspecified: Secondary | ICD-10-CM

## 2019-11-14 DIAGNOSIS — F3132 Bipolar disorder, current episode depressed, moderate: Secondary | ICD-10-CM

## 2019-11-14 DIAGNOSIS — Z630 Problems in relationship with spouse or partner: Secondary | ICD-10-CM | POA: Diagnosis not present

## 2019-11-14 DIAGNOSIS — F411 Generalized anxiety disorder: Secondary | ICD-10-CM | POA: Diagnosis not present

## 2019-11-14 DIAGNOSIS — F1210 Cannabis abuse, uncomplicated: Secondary | ICD-10-CM

## 2019-11-14 DIAGNOSIS — F4310 Post-traumatic stress disorder, unspecified: Secondary | ICD-10-CM

## 2019-11-14 MED ORDER — DIAZEPAM 5 MG PO TABS: 5.0000 mg | ORAL_TABLET | Freq: Three times a day (TID) | ORAL | 1 refills | Status: DC | PRN

## 2019-11-14 MED ORDER — LAMOTRIGINE 150 MG PO TABS: ORAL_TABLET | ORAL | 0 refills | Status: DC

## 2019-11-14 NOTE — Progress Notes (Signed)
[facility]  Patient ID: Tristan [name],  MRN: [id number]  PCP: Patient, No Pcp Per  Date of Evaluation: 11/14/2019 Time spent:20 minutes  Chief Complaint:  Chief Complaint    Anxiety; Depression; Follow-up      HISTORY/CURRENT STATUS: HPI For routine med check.  Has had more stress and some sad situations lately.  Had to put one of their dogs down a few weeks ago.  Plus the stressors of COVID and the fact that they shows where he presents his work are still postponed indefinitely.  So that is a financial drain.  "It is just a lot."  He becomes more anxious when thinking about the situation and the Valium is not always helping anymore, in fact it does not seem strong enough.  He is able to enjoy things but again, due to COVID, there is not much to do.  Energy and motivation are fair, he does have trouble sleeping, and the Valium has helped that a little bit.  No suicidal or homicidal thoughts.  Patient denies increased energy with decreased need for sleep, no increased talkativeness, no racing thoughts, no impulsivity or risky behaviors, no increased spending, no increased libido, no grandiosity.  Denies dizziness, syncope, seizures, numbness, tingling, tremor, tics, unsteady gait, slurred speech, confusion. Denies muscle or joint pain, stiffness, or dystonia.  Individual Medical History/ Review of Systems: Changes? :No    Past medications for mental health diagnoses include: Celexa, Lamictal, Lexapro, Seroquel, prazosin, Trileptal, trazodone, Vistaril  Allergies: Patient has no known allergies.  Current Medications:  Current Outpatient Medications:  .  busPIRone (BUSPAR) 15 MG tablet, 3 po q am, 2 q hs., Disp: 450 tablet, Rfl: 1 .  hydrOXYzine (ATARAX/VISTARIL) 25 MG tablet, TAKE 1-2 TABLETS (25-50 MG TOTAL) BY MOUTH EVERY 6 (SIX) HOURS AS NEEDED FOR ANXIETY., Disp: 240 tablet, Rfl: 5 .  lamoTRIgine (LAMICTAL) 150 MG tablet, 1.5 pills q am, 1 po qhs., Disp: 225  tablet, Rfl: 0 .  Oxcarbazepine (TRILEPTAL) 300 MG tablet, TAKE 1 TAB EVERY AM AND 2 TABLETS EVERY PM, Disp: 270 tablet, Rfl: 0 .  diazepam (VALIUM) 5 MG tablet, Take 1 tablet (5 mg total) by mouth every 8 (eight) hours as needed for anxiety., Disp: 90 tablet, Rfl: 1 Medication Side Effects: none  Family Medical/ Social History: Changes? No  MENTAL HEALTH EXAM:  There were no vitals taken for this visit.There is no height or weight on file to calculate BMI.  General Appearance: Casual, Neat and Well Groomed  Eye Contact:  Good  Speech:  Clear and Coherent and Normal Rate  Volume:  Normal  Mood:  Depressed  Affect:  Depressed  Thought Process:  Goal Directed and Descriptions of Associations: Intact  Orientation:  Full (Time, Place, and Person)  Thought Content: Logical   Suicidal Thoughts:  No  Homicidal Thoughts:  No  Memory:  WNL  Judgement:  Good  Insight:  Good  Psychomotor Activity:  Normal  Concentration:  Concentration: Good  Recall:  Good  Fund of Knowledge: Good  Language: Good  Assets:  Desire for Improvement  ADL's:  Intact  Cognition: WNL  Prognosis:  Good    DIAGNOSES:    ICD-10-CM   1. Generalized anxiety disorder  F41.1   2. Insomnia, unspecified type  G47.00   3. Bipolar I disorder (HCC)  F31.9   4. Tetrahydrocannabinol (THC) use disorder, mild, abuse  F12.10   5. PTSD (post-traumatic stress disorder)  F43.10     Receiving Psychotherapy: Yes  With Dr. Luan Moore  RECOMMENDATIONS:  PDMP was reviewed. I spent 20 minutes with him. We discussed to the anxiety and the fact that he is on a low dose of the Valium.  I recommend that we increase the dose.  He would like to try that.  We also discussed bumping up the Lamictal to help with the worsening of depression. Increase Valium to 5 mg, 1 p.o. 3 times daily as needed. If needed, he can take 1.5 Valium at night prn sleep.  Or if he has left over 2 mg pills, he can take 1 5 mg and 1 2 mg.  He  understands. Continue BuSpar 15 mg, 3 p.o. every morning and 2 p.o. nightly. Continue hydroxyzine 25 mg, 1-2 every 6 hours as needed anxiety. Increase  Lamictal 150 mg, 1.5 pills every morning and 1 p.o. nightly. Continue Trileptal 300 mg, 1 every morning and 2 nightly. Continue therapy with Dr. Luan Moore. Return in 6 weeks.  Donnal Moat, PA-C

## 2019-11-14 NOTE — Progress Notes (Signed)
Psychotherapy Progress Note [facility] 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  Attributes still to disruption of his creative world due to pandemic shutting down shows and seasonal depression on top of it.  Suspects low vitamin D and limited light exposure through the season, hopeful of spring reviving something.    Series of extra stressors lately, too.  [name] has been trying to refinance the house, held up by disruptions in the handling of banking information.  Very evocative loss of their dog, [name], a Dachsund raised form puppyhood, effectively the one child they have ever had.  Different perspectives on putting him down -- [name] tends to feel inordinately guilty, second guessing whether she spent enough time with him or loved well enough (possibly dealing with reawakened feelings from the abortion); [name] likes the option to euthanize based on having had dogs run away or get out and get hit by cars growing up.  [name] had been in declining health for some time, with widespread arthritis and pain, possibly dementing, when he suddenly began dragging a hind leg.  Last year he was brought back from an overdose of anesthetic having teeth removed.  They had been warned that a 2nd leg could not be far behind, knew it was likely they were facing the decision.  Appetite declined, and when they brought the dog into bed he was restless, unable to get comfortable all night.  Next day he struggled worse with walking and pain, knew it was time, took him to the vet.  More evocative than  expected for not being able to give [name] a comfortable position to ride in and for him uncharacteristically squealing when injected.  Very hard on [name], saying a lot of "should haves" with herself, plus history of having to give away a rescue dog that became aggressive.  PT himself feels [name] has been distant, both for straining over the finances and for taking on this complicated grief.  Discussed reactions to her tension, tendency to back off himself, and suggested he take the initiative to reach out to her, to offer time holding or listening, and permissive word to cry, or feel awkward, just work, talk, not talk, whatever -- to enact the hope that their relationship is a large enough, strong enough place to have these feelings, bring out pain, rather than have to deny feelings to prevent triggering each other.  Such assumptions are a lot of what went wrong 3 years ago, in fact.    Added stress as [name] has essentially gone vegetarian, so Pt is cooking separately and out of his element more.  This in turn means feeling unconfident and having a resurgence of self-castigating thoughts (e.g., calling himself stupid) when not sure things turn out OK.  That, and he finds he needs 4 times the amount of time the recipes say, for starting and stopping, sidetracking, and obsessing about how he cuts food.  A few notable mistakes.  Focused on self-critical thoughts, "talking through" to his critical voice, The Watcher, to explore what's bothering him and empathically confront how  the criticism is getting in the way of its own goals for The Doer.  Noted how he is, without realizing it, recapitulating the same criticism he worked to get away from in his FOO.  Encouraged in being as tolerant and affirming a father to himself as he wished he had had as a child.    Therapeutic modalities: Cognitive Behavioral Therapy, Solution-Oriented/Positive Psychology and Gestalt/Psychodrama  Mental  Status/Observations:  Appearance:   Casual     Behavior:  Appropriate  Motor:  Normal  Speech/Language:   Clear and Coherent  Affect:  Appropriate  Mood:  depressed  Thought process:  normal  Thought content:    WNL  Sensory/Perceptual disturbances:    WNL  Orientation:  Fully oriented  Attention:  Good  Concentration:  Good  Memory:  WNL  Insight:    Good  Judgment:   Good  Impulse Control:  Good   Risk Assessment: Danger to Self: No Self-injurious Behavior: No Danger to Others: No Physical Aggression / Violence: No Duty to Warn: No Access to Firearms a concern: No  Assessment of progress:  situational setback(s)  Diagnosis:   ICD-10-CM   1. Bipolar affective disorder, currently depressed, moderate (HCC)  F31.32   2. Insomnia, unspecified type  G47.00   3. Relationship problem between partners  Z63.0   4. History of posttraumatic stress disorder (PTSD)  Z86.59    Plan:  . Try reaching out to [facility] with permission to feel stressed, cry, etc., be held, talk/not talk, etc. . Look to reach in to himself the same, offering himself permission to be working with new things, learning how, making mistakes -- especially as fulfillment of his wish to escape harsh father and true freedom to be his own guide now . Recommend journaling moments when he encounters The Watcher, self-talk involved, contrast supportive self-talk, note what happens . Other recommendations/advice as may be noted above . Continue to utilize previously learned skills ad lib . Maintain medication as prescribed and work faithfully with relevant prescriber(s) if any changes are desired or seem indicated . Call the clinic on-call service, present to ER, or call 911 if any life-threatening psychiatric crisis Return in about 1 month (around 12/15/2019). . Already scheduled visit in this office 12/26/2019.  [name], PhD [name], PhD LP Clinical Psychologist, [facility] [address] [place], [place] [address] [phone]

## 2019-12-08 ENCOUNTER — Other Ambulatory Visit: Payer: Self-pay | Admitting: Physician Assistant

## 2019-12-13 ENCOUNTER — Other Ambulatory Visit: Payer: Self-pay

## 2019-12-13 ENCOUNTER — Ambulatory Visit (INDEPENDENT_AMBULATORY_CARE_PROVIDER_SITE_OTHER): Payer: 59 | Admitting: Psychiatry

## 2019-12-13 DIAGNOSIS — Z8659 Personal history of other mental and behavioral disorders: Secondary | ICD-10-CM

## 2019-12-13 DIAGNOSIS — F411 Generalized anxiety disorder: Secondary | ICD-10-CM

## 2019-12-13 DIAGNOSIS — Z630 Problems in relationship with spouse or partner: Secondary | ICD-10-CM | POA: Diagnosis not present

## 2019-12-13 DIAGNOSIS — F3131 Bipolar disorder, current episode depressed, mild: Secondary | ICD-10-CM | POA: Diagnosis not present

## 2019-12-13 NOTE — Progress Notes (Signed)
Psychotherapy Progress Note [facility] Tristan Czar, PhD LP  Patient ID: Tristan [name]     MRN: [id number] Therapy format: Individual psychotherapy Date: 12/13/2019      Start: 10:05a     Stop: 10:55a     Time Spent: 50 min Location: In-person   Session narrative (presenting needs, interim history, self-report of stressors and symptoms, applications of prior therapy, status changes, and interventions made in session) Remains decaffeinated, found it helped his allergies.  Stymied in raising an exercise habit.  Helps when he does, e.g., dog walking, but anxiety seeps in between active moments.  Probed possibilities of issues he may be avoiding, including feeling dragged along by Tristan [name]'s handling of her own grief.  Notably, she said she needed time after Tristan [name] but turned around and got another [name], same coloring, also deaf, also older, also hip issues but also strong separation anxiety, such that she is indifferent to Tristan [name].  Latest in a series of issues where Tristan [name] takes on huge projects and Tristan [name] gets approached more for a cursory permission.  Acknowledged how this recapitulates some of the sense of helplessness and disenfranchisement he experienced in his FOO and compared to the original crisis that led hm to therapy (Tristan [name] taking it upon herself to get pregnant, thinking a baby would "fix" his depression).  Discussed possibilities for crafting more collaborative decision-making, bringing up the desire to do so, and assertiveness points for a possible "do-over" for the pre-dog discussion, emphasizing empathy for Tristan [name]'s wishes, perspective where he sees things more complicated or possibly harmful, and asking her to consider what in her own mind would make the thing she wants either a bad idea or too soon.  Agrees, will see about starting more of a how-to conversation about making big decisions together.  Therapeutic modalities:  Cognitive Behavioral Therapy, Solution-Oriented/Positive Psychology and Assertiveness/Communication  Mental Status/Observations:  Appearance:   Casual     Behavior:  Appropriate  Motor:  Normal  Speech/Language:   Clear and Coherent  Affect:  Appropriate  Mood:  dysthymic, some anxiety  Thought process:  normal  Thought content:    WNL  Sensory/Perceptual disturbances:    WNL  Orientation:  Fully oriented  Attention:  Good    Concentration:  Good  Memory:  WNL  Insight:    Good  Judgment:   Good  Impulse Control:  Good   Risk Assessment: Danger to Self: No Self-injurious Behavior: No Danger to Others: No Physical Aggression / Violence: No Duty to Warn: No Access to Firearms a concern: No  Assessment of progress:  progressing  Diagnosis:   ICD-10-CM   1. Generalized anxiety disorder  F41.1   2. Relationship problem between partners  Z63.0   3. Bipolar affective disorder, currently depressed, mild (HCC)  F31.31   4. History of posttraumatic stress disorder (PTSD)  Z86.59    Plan:  . Conversation with Tristan [name] about establishing more collaboration, less anxiety before making decisions that redirect their living situation, option to do a hypothetical do-over re. the dog . Continue decaffeinated, and hopefully THC-free . Other recommendations/advice as may be noted above . Continue to utilize previously learned skills ad lib . Maintain medication as prescribed and work faithfully with relevant prescriber(s) if any changes are desired or seem indicated . Call the clinic on-call service, present to ER, or call 911 if any life-threatening psychiatric crisis Return in about 1 month (around 01/12/2020). . Already scheduled visit  in this office 12/26/2019.  Blanchie Serve, PhD Luan Moore, PhD LP Clinical Psychologist, [facility] [address] [place], [place] [address] [phone]

## 2019-12-26 ENCOUNTER — Ambulatory Visit (INDEPENDENT_AMBULATORY_CARE_PROVIDER_SITE_OTHER): Payer: 59 | Admitting: Physician Assistant

## 2019-12-26 ENCOUNTER — Encounter: Payer: Self-pay | Admitting: Physician Assistant

## 2019-12-26 ENCOUNTER — Other Ambulatory Visit: Payer: Self-pay

## 2019-12-26 DIAGNOSIS — F411 Generalized anxiety disorder: Secondary | ICD-10-CM | POA: Diagnosis not present

## 2019-12-26 DIAGNOSIS — F4310 Post-traumatic stress disorder, unspecified: Secondary | ICD-10-CM

## 2019-12-26 DIAGNOSIS — F3131 Bipolar disorder, current episode depressed, mild: Secondary | ICD-10-CM

## 2019-12-26 DIAGNOSIS — F1210 Cannabis abuse, uncomplicated: Secondary | ICD-10-CM | POA: Diagnosis not present

## 2019-12-26 DIAGNOSIS — G4700 Insomnia, unspecified: Secondary | ICD-10-CM

## 2019-12-26 MED ORDER — BUSPIRONE HCL 15 MG PO TABS: ORAL_TABLET | ORAL | 1 refills | Status: DC

## 2019-12-26 NOTE — Progress Notes (Signed)
[facility]  Patient ID: Tristan [name],  MRN: [id number]  PCP: Patient, No Pcp Per  Date of Evaluation: 12/26/2019 Time spent:20 minutes  Chief Complaint:  Chief Complaint    Follow-up      HISTORY/CURRENT STATUS: HPI For routine med check.  Here for 6-week med check.  At the last visit, we increased Lamictal.  I also increased the frequency that he could take the Valium.  Due to current life circumstances, he is really unable to tell if those changes have made any difference.  He and his wife [name] are still grieving the death of their dog, and 2 weeks after their dog passed away, they rescued another dog that bit as he wanted.  He is happy to have gotten another dog but this 1 has severe separation anxiety and clings to his wife.  If she gets out of sight or definitely when she goes out, this dog will howl and is miserable until [place] gets back.  He is handling it all well but of course its change.  He still feels down at times but unsure if it is only situational or whether it could be biochemical as well.  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  He started this project a couple of weeks ago.  Energy and motivation are good for the most part.  He sleeps well most of the time.  No SI/HI.  Patient denies increased energy with decreased need for sleep, no increased talkativeness, no racing thoughts, no impulsivity or risky behaviors, no increased spending, no increased libido, no grandiosity, no increased irritability or anger, and no hallucinations.  Anxiety is still present but tolerable.  He does take the Valium when needed.  Denies dizziness, syncope, seizures, numbness, tingling, tremor, tics, unsteady gait, slurred speech, confusion. Denies muscle or joint pain, stiffness, or dystonia.  Individual Medical History/ Review of  Systems: Changes? :No    Past medications for mental health diagnoses include: Celexa, Lamictal, Lexapro, Seroquel, prazosin, Trileptal, trazodone, Vistaril  Allergies: Patient has no known allergies.  Current Medications:  Current Outpatient Medications:  .  busPIRone (BUSPAR) 15 MG tablet, 3 po q am, 2 q hs., Disp: 450 tablet, Rfl: 1 .  diazepam (VALIUM) 5 MG tablet, Take 1 tablet (5 mg total) by mouth every 8 (eight) hours as needed for anxiety., Disp: 90 tablet, Rfl: 1 .  hydrOXYzine (ATARAX/VISTARIL) 25 MG tablet, TAKE 1-2 TABLETS (25-50 MG TOTAL) BY MOUTH EVERY 6 (SIX) HOURS AS NEEDED FOR ANXIETY., Disp: 240 tablet, Rfl: 5 .  lamoTRIgine (LAMICTAL) 150 MG tablet, 1.5 pills q am, 1 po qhs., Disp: 225 tablet, Rfl: 0 .  Oxcarbazepine (TRILEPTAL) 300 MG tablet, TAKE 1 TAB EVERY AM AND 2 TABLETS EVERY PM, Disp: 270 tablet, Rfl: 0 Medication Side Effects: none  Family Medical/ Social History: Changes? Yes he and wife got a new dog that has severe separation anxiety for his wife.   MENTAL HEALTH EXAM:  There were no vitals taken for this visit.There is no height or weight on file to calculate BMI.  General Appearance: Casual, Neat and Well Groomed  Eye Contact:  Good  Speech:  Clear and Coherent and Normal Rate  Volume:  Normal  Mood:  Euthymic  Affect:  Appropriate  Thought Process:  Goal Directed and Descriptions of Associations:  Intact  Orientation:  Full (Time, Place, and Person)  Thought Content: Logical   Suicidal Thoughts:  No  Homicidal Thoughts:  No  Memory:  WNL  Judgement:  Good  Insight:  Good  Psychomotor Activity:  Normal  Concentration:  Concentration: Good  Recall:  Good  Fund of Knowledge: Good  Language: Good  Assets:  Desire for Improvement  ADL's:  Intact  Cognition: WNL  Prognosis:  Good    DIAGNOSES:    ICD-10-CM   1. Bipolar affective disorder, currently depressed, mild ([place])  F31.31   2. Generalized anxiety disorder  F41.1   3. PTSD  (post-traumatic stress disorder)  F43.10   4. Tetrahydrocannabinol (THC) use disorder, mild, abuse  F12.10   5. Insomnia, unspecified type  G47.00     Receiving Psychotherapy: Yes with Dr. Luan Moore   RECOMMENDATIONS:  PDMP was reviewed. I spent 20 minutes with him. We agreed to make no changes at this time.  It is still difficult to know whether his mental and emotional state is due to to circumstances and/or abnormal biochemistry that affects mood. Continue BuSpar 15 mg, 3 p.o. every morning, 2 p.o. nightly. Continue Valium 5 mg, 1 p.o. 3 times daily as needed. Continue hydroxyzine 25 mg, 1-2 every 6 hours as needed anxiety. Continue Lamictal 150 mg, 1.5 pills every morning, 1 pill nightly. Continue Trileptal 300 mg, 1 p.o. every morning and 2 p.o. nightly. Continue therapy with Dr. Luan Moore. Return in 6 weeks.  Donnal Moat, PA-C

## 2020-01-16 ENCOUNTER — Other Ambulatory Visit: Payer: Self-pay | Admitting: Physician Assistant

## 2020-01-19 ENCOUNTER — Other Ambulatory Visit: Payer: Self-pay

## 2020-01-19 ENCOUNTER — Ambulatory Visit (INDEPENDENT_AMBULATORY_CARE_PROVIDER_SITE_OTHER): Payer: 59 | Admitting: Psychiatry

## 2020-01-19 DIAGNOSIS — G4700 Insomnia, unspecified: Secondary | ICD-10-CM | POA: Diagnosis not present

## 2020-01-19 DIAGNOSIS — F411 Generalized anxiety disorder: Secondary | ICD-10-CM

## 2020-01-19 DIAGNOSIS — Z8659 Personal history of other mental and behavioral disorders: Secondary | ICD-10-CM

## 2020-01-19 DIAGNOSIS — F319 Bipolar disorder, unspecified: Secondary | ICD-10-CM | POA: Diagnosis not present

## 2020-01-19 NOTE — Progress Notes (Signed)
Psychotherapy Progress Note [facility] Luan Moore, PhD LP  Patient ID: Tristan [name]     MRN: [id number] Therapy format: Individual psychotherapy Date: 01/19/2020      Start: 1:04p     Stop: 1:54p     Time Spent: 50 min Location: In-person   Session narrative (presenting needs, interim history, self-report of stressors and symptoms, applications of prior therapy, status changes, and interventions made in session) Tristan [name] out to a restaurant first time since pandemic, very enjoyable for both Pt & W.  Finding himself reading more lately, enjoying old sci-fi pulp stories.    Sleep continues disturbed by rescue dog's separation anxiety behavior, and trying to manage [name]'s unrealistic expectations.  Sees her alternating hopeful and despairing sometimes, but long habit to take on a project and seem to get in over her head for what it's going to take, needs PT to step in.  Did not decide to try having a mock do-over of the discussion whether to get the dog, as proposed last time, just felt like too much at stake emotionally, not sure W would understand the rationale.  Reviewed the idea of trying to approach "hot" ideas more equitably, with a sense that they can be discussed and collaborated more and get practice explicitly weighing out what matters to both instead of being caught up in tentativeness with each other and defaulting to patterns that make it seem like W is steamrolling.  Endorsed eventually doing that as a form of the freedom they each have wanted trying to do better than dysfunctional examples in their FOOs.  Discussed wishes and assertiveness tactics specifically for handling the dog, and how to approach separation anxiety and pestering behaviors.    Feeling gingerly about bringing up anything soon because of developments yesterday.  Was contacted in the fall by an ex, the one he has called "crazy [name]", saying she is facing breast cancer and was reaching out to make  amends for hurtful behavior back in the day.  Gave gracious response, 2 wks later got a friend request he accepted, has seen [name] like posts of his, and now [name] noticed yesterday.  Gave her the back story, openly and matter of factly, but has lingering feeling she feels insecure about it.  Encouraged to trust that one thing doesn't have to have anything to do with the other, that he can still create practice asking a change in behavior, and it would be a gift to their future to work through something like that without it having to bottle up or happen during crisis, help both of them drop anxiety about real or perceived conflict.  01-27-2023 is anniversary of M's death (15 yrs), and about a month further for [name] of both fathers passing.  Sees himself better, not walking around irritable for a couple months, just able to feel and acknowledge for the most part, and recognizing it smaller and sooner when he is getting tense/irritable because of mixed feelings attached to anniversaries.  Affirmed and encouraged.  Therapeutic modalities: Solution-Oriented/Positive Psychology and Assertiveness/Communication  Mental Status/Observations:  Appearance:   Casual     Behavior:  Appropriate  Motor:  Normal  Speech/Language:   Clear and Coherent  Affect:  Appropriate  Mood:  mildly anxious, mostly euthymic  Thought process:  normal  Thought content:    WNL  Sensory/Perceptual disturbances:    WNL  Orientation:  Fully oriented  Attention:  Good    Concentration:  Good  Memory:  WNL  Insight:    Good  Judgment:   Good  Impulse Control:  Good   Risk Assessment: Danger to Self: No Self-injurious Behavior: No Danger to Others: No Physical Aggression / Violence: No Duty to Warn: No Access to Firearms a concern: No  Assessment of progress:  progressing  Diagnosis:   ICD-10-CM   1. Generalized anxiety disorder  F41.1   2. Bipolar I disorder (HCC)  F31.9   3. History of posttraumatic stress disorder  (PTSD)  Z86.59    Plan:  . Cont encouragement to try out asking for a behavior change outside pressured moments -- rationale for both of them that it is practice doing better than the families they grew up in . Option to "do over" a decision conversation - less important . Continue low-caffeine, low-THC lifestyle  . Other recommendations/advice as may be noted above . Continue to utilize previously learned skills ad lib . Maintain medication as prescribed and work faithfully with relevant prescriber(s) if any changes are desired or seem indicated . Call the clinic on-call service, present to ER, or call 911 if any life-threatening psychiatric crisis Return in about 6 weeks (around 03/01/2020). . Already scheduled visit in this office 02/06/2020.  [name], PhD [name], PhD LP Clinical Psychologist, [facility] [address] [place], [place] [address] [phone]

## 2020-02-06 ENCOUNTER — Ambulatory Visit: Payer: 59 | Admitting: Physician Assistant

## 2020-02-10 ENCOUNTER — Other Ambulatory Visit: Payer: Self-pay | Admitting: Physician Assistant

## 2020-02-29 ENCOUNTER — Ambulatory Visit (INDEPENDENT_AMBULATORY_CARE_PROVIDER_SITE_OTHER): Payer: 59 | Admitting: Psychiatry

## 2020-02-29 ENCOUNTER — Other Ambulatory Visit: Payer: Self-pay

## 2020-02-29 DIAGNOSIS — F319 Bipolar disorder, unspecified: Secondary | ICD-10-CM

## 2020-02-29 DIAGNOSIS — Z8659 Personal history of other mental and behavioral disorders: Secondary | ICD-10-CM | POA: Diagnosis not present

## 2020-02-29 DIAGNOSIS — F411 Generalized anxiety disorder: Secondary | ICD-10-CM | POA: Diagnosis not present

## 2020-02-29 DIAGNOSIS — Z630 Problems in relationship with spouse or partner: Secondary | ICD-10-CM

## 2020-02-29 NOTE — Progress Notes (Signed)
Psychotherapy Progress Note [facility] [name], PhD LP  Patient ID: RODRIGUS KILKER     MRN: [id number] Therapy format: Family therapy w/ patient -- accompanied by Beatriz Stallion Date: 02/29/2020      Start: 10:22a     Stop: 11:08a     Time Spent: 46 min Location: In-person   Session narrative (presenting needs, interim history, self-report of stressors and symptoms, applications of prior therapy, status changes, and interventions made in session) Her with wife today to help process issue re his former Donnald Garre getting in touch.  [name] has been substantially uneasy about it, and Pt has been on pins and needles to protect her, which influenced him to keep it quiet initially.  Processed W's unease about her, challenging the sense of threat she's had, affirming she knows [name] better than that, and how, despite the primal fear of competition, it doesn't matter what Toniann Fail could imaginably try unless [name] is actually interested and willing, which he makes clear he is not.  In fact, for his part, he notes that having Toniann Fail as a presence in his digital life keeps him crystal clear about how wrong things can go with the wrong relationship, how toxic she was back in the day (and still could be if they reunited), and that being in touch with her is also a shame trigger, actually, about actions he would rather not name at this time.  Speaking these things, and seeing [name] become tearful about them, was very effective for calming [name]'s admittedly irrational fear.  For good measure framed actions they can take together if/when her feelings resurface, including imagining how the Gerilyn Nestle she knows would respond to any attempt to sway him, having [name] tell her how he would approach such situations so she can better inform her own constructive imagination, and mutually agreed permission to acknowledge when it's a hard day for her feelings without mistaking that for blame, irrational  responsibility, or the need to walk on eggshells.  Also processed the distance [name] has felt during the time they have been struggling uneasily with this, and confronted both tendencies to try too hard to be nice and not bring things up, only to have too much assumed by the other.  Noted also that [name] has worried hard about becoming like her mother and [name] taking on the timid role her formerly harsh father did.  Assured they are both too conscious and intentional for that to happen.  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  Affirmed and encouraged.  Therapeutic modalities: Cognitive Behavioral Therapy, Solution-Oriented/Positive Psychology and Assertiveness/Communication  Mental Status/Observations:  Appearance:   Casual     Behavior:  Appropriate  Motor:  Normal  Speech/Language:   Clear and Coherent  Affect:  Appropriate  Mood:  anxious  Thought process:  normal  Thought content:    WNL  Sensory/Perceptual disturbances:    WNL  Orientation:  Fully oriented  Attention:  Good    Concentration:  Good  Memory:  WNL  Insight:    Good  Judgment:   Good  Impulse Control:  Good   Risk Assessment: Danger to Self: No Self-injurious Behavior: No Danger to Others: No Physical Aggression / Violence: No Duty to Warn: No Access to Firearms a concern: No  Assessment of progress:  progressing  Diagnosis:   ICD-10-CM  1. Bipolar I disorder (HCC)  F31.9   2. Relationship problem between partners  Z63.0   3. History of posttraumatic stress disorder (PTSD)  Z86.59   4. Generalized anxiety disorder  F41.1    Plan:  Marland Kitchen Apply recommendations for collaboratively strengthening trust and openness around the [place] situation . Option to reveal more of his regrets from the old relationship, for vulnerability and trust's sake and to help exorcise  shame . [name] to practice constructive imagery to balance her own irrational worry . Mutual commitment its OK to talk openly about these concerns at home, don't let them fester . Other recommendations/advice as may be noted above . Continue to utilize previously learned skills ad lib . Maintain medication as prescribed and work faithfully with relevant prescriber(s) if any changes are desired or seem indicated . Call the clinic on-call service, present to ER, or call 911 if any life-threatening psychiatric crisis Return in about 6 weeks (around 04/11/2020). . Already scheduled visit in this office 03/01/2020.  [name], PhD [name], PhD LP Clinical Psychologist, [facility] [address] [place], [place] [address] [phone]

## 2020-03-01 ENCOUNTER — Ambulatory Visit (INDEPENDENT_AMBULATORY_CARE_PROVIDER_SITE_OTHER): Payer: 59 | Admitting: Physician Assistant

## 2020-03-01 ENCOUNTER — Encounter: Payer: Self-pay | Admitting: Physician Assistant

## 2020-03-01 DIAGNOSIS — Z8659 Personal history of other mental and behavioral disorders: Secondary | ICD-10-CM

## 2020-03-01 DIAGNOSIS — F1210 Cannabis abuse, uncomplicated: Secondary | ICD-10-CM

## 2020-03-01 DIAGNOSIS — G4700 Insomnia, unspecified: Secondary | ICD-10-CM | POA: Diagnosis not present

## 2020-03-01 DIAGNOSIS — F319 Bipolar disorder, unspecified: Secondary | ICD-10-CM | POA: Diagnosis not present

## 2020-03-01 DIAGNOSIS — F411 Generalized anxiety disorder: Secondary | ICD-10-CM

## 2020-03-01 MED ORDER — ZALEPLON 10 MG PO CAPS: ORAL_CAPSULE | ORAL | 1 refills | Status: DC

## 2020-03-01 NOTE — Progress Notes (Signed)
[facility] Check  Patient ID: Tristan [name],  MRN: [id number]  PCP: Patient, No Pcp Per  Date of Evaluation: 03/01/2020 Time spent:30 minutes  Chief Complaint:  Chief Complaint    Insomnia; Anxiety; Depression      HISTORY/CURRENT STATUS: HPI For routine med check.  The biggest problem he is having right now is not being able to stay asleep.  When he uses the Valium and the hydroxyzine, he can go to sleep and stay asleep for approximately 4 hours.  He usually wakes up around 2 AM but sometimes it is as late as 4 or 5:00.  At the most, he is getting 6 hours of sleep per night.  He feels really tired the next day which affects his mood.  He and his wife have been going through a little of a rough patch but they have discussed it with Dr. Farrel Demark and are working through it.  He is able to enjoy things to a degree.  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  That gets him down, understandably that he has learned to deal with it.  There is nothing he can do about it at this point.  Energy and motivation are good.  Not isolating on purpose.  Denies suicidal or homicidal thoughts.  He continues to have anxiety but the Valium and the hydroxyzine are very helpful.  He needs 1 or the other, sometimes both, most days.  Continues to use THC regularly which can also be helpful for the anxiety.  Patient denies increased energy with decreased need for sleep, no increased talkativeness, no racing thoughts, no impulsivity or risky behaviors, no increased spending, no increased libido, no grandiosity, no increased irritability or anger, and no hallucinations.  Denies dizziness, syncope, seizures, numbness, tingling, tremor, tics, unsteady gait, slurred speech, confusion. Denies muscle or joint pain, stiffness, or dystonia.  Individual Medical History/ Review of Systems: Changes? :No    Past medications  for mental health diagnoses include: Celexa, Lamictal, Lexapro, Seroquel, prazosin, Trileptal, trazodone, Vistaril  Allergies: Patient has no known allergies.  Current Medications:  Current Outpatient Medications:  .  busPIRone (BUSPAR) 15 MG tablet, 3 po q am, 2 q hs., Disp: 450 tablet, Rfl: 1 .  diazepam (VALIUM) 5 MG tablet, Take 1 tablet (5 mg total) by mouth every 8 (eight) hours as needed for anxiety., Disp: 90 tablet, Rfl: 1 .  hydrOXYzine (ATARAX/VISTARIL) 25 MG tablet, TAKE 1-2 TABLETS (25-50 MG TOTAL) BY MOUTH EVERY 6 (SIX) HOURS AS NEEDED FOR ANXIETY., Disp: 240 tablet, Rfl: 5 .  lamoTRIgine (LAMICTAL) 150 MG tablet, TAKE 1 TABLET BY MOUTH TWICE A DAY (Patient taking differently: 150 mg. TAKE 1.5 q am, and 1 po qhs.), Disp: 180 tablet, Rfl: 0 .  Oxcarbazepine (TRILEPTAL) 300 MG tablet, TAKE 1 TAB EVERY AM AND 2 TABLETS EVERY PM, Disp: 270 tablet, Rfl: 0 .  zaleplon (SONATA) 10 MG capsule, 1 po qhs prn sleep and may repeat 1 for midnocturnal awakening prn if has 3 hours left to sleep, Disp: 30 capsule, Rfl: 1 Medication Side Effects: none  Family Medical/ Social History: Changes?  No  MENTAL HEALTH EXAM:  There were no vitals taken for this visit.There is no height or weight on file to calculate BMI.  General Appearance: Casual, Neat and Well Groomed  Eye Contact:  Good  Speech:  Clear and Coherent and Normal Rate  Volume:  Normal  Mood:  Euthymic  Affect:  Appropriate  Thought Process:  Goal Directed and Descriptions of Associations: Intact  Orientation:  Full (Time, Place, and Person)  Thought Content: Logical   Suicidal Thoughts:  No  Homicidal Thoughts:  No  Memory:  WNL  Judgement:  Good  Insight:  Good  Psychomotor Activity:  Normal  Concentration:  Concentration: Good  Recall:  Good  Fund of Knowledge: Good  Language: Good  Assets:  Desire for Improvement  ADL's:  Intact  Cognition: WNL  Prognosis:  Good    DIAGNOSES:    ICD-10-CM   1. Insomnia,  unspecified type  G47.00   2. Bipolar I disorder (HCC)  F31.9   3. Generalized anxiety disorder  F41.1   4. Tetrahydrocannabinol (THC) use disorder, mild, abuse  F12.10   5. History of posttraumatic stress disorder (PTSD)  Z86.59     Receiving Psychotherapy: Yes with Dr. [name]   RECOMMENDATIONS:  PDMP was reviewed. I provided 30 minutes of face-to-face time during this encounter. We discussed the different options for sleep.  He can continue the hydroxyzine and Valium close to bedtime since that does seem to help go to sleep.  I recommend we add Sonata because this can be taken in the middle of the night if it is needed to go back to sleep.  He must have at least 3 hours left to sleep however.  He verbalizes understanding and would like to try.  Of course we discussed the hydroxyzine, Valium, and the Sonata and if he does take them altogether then he could be more sedated then with 1 or 2 of them along. Start Sonata 1 p.o. nightly as needed sleep, but mostly given for mid nocturnal awakening, 1 p.o. as needed, As long as he has 3 hours left to sleep.   Continue BuSpar 15 mg, 3 p.o. every morning, 2 p.o. nightly. Continue Valium 5 mg, 1 p.o. 3 times daily as needed. Continue hydroxyzine 25 mg, 1-2 every 6 hours as needed anxiety. Continue Lamictal 150 mg, 1.5 pills every morning, 1 pill nightly. Continue Trileptal 300 mg, 1 p.o. every morning and 2 p.o. nightly. Continue therapy with Dr. [name]. Return in 6 weeks.  Melony Overly, PA-C

## 2020-03-02 ENCOUNTER — Ambulatory Visit: Payer: 59 | Admitting: Psychiatry

## 2020-03-23 ENCOUNTER — Other Ambulatory Visit: Payer: Self-pay | Admitting: Physician Assistant

## 2020-03-30 ENCOUNTER — Telehealth: Payer: Self-pay | Admitting: Physician Assistant

## 2020-03-30 ENCOUNTER — Other Ambulatory Visit: Payer: Self-pay | Admitting: Physician Assistant

## 2020-03-30 MED ORDER — LAMOTRIGINE 150 MG PO TABS: ORAL_TABLET | ORAL | 1 refills | Status: DC

## 2020-03-30 NOTE — Telephone Encounter (Signed)
Prescription was sent to the [facility] [place].  Quantity 225 pills

## 2020-03-30 NOTE — Telephone Encounter (Signed)
PT needs a new RX for Lamictal - HE is taking 2.5 per day and the last RX on 6/25 is for 180. Please send in a new RX to [facility] [place] for 2.5 per day reflecting a full 90 day supply.

## 2020-04-18 ENCOUNTER — Other Ambulatory Visit: Payer: Self-pay | Admitting: Physician Assistant

## 2020-04-18 NOTE — Telephone Encounter (Signed)
Apt 09/07 °

## 2020-04-24 ENCOUNTER — Encounter: Payer: Self-pay | Admitting: Physician Assistant

## 2020-04-24 ENCOUNTER — Ambulatory Visit (INDEPENDENT_AMBULATORY_CARE_PROVIDER_SITE_OTHER): Payer: BC Managed Care – PPO | Admitting: Physician Assistant

## 2020-04-24 ENCOUNTER — Other Ambulatory Visit: Payer: Self-pay

## 2020-04-24 DIAGNOSIS — F99 Mental disorder, not otherwise specified: Secondary | ICD-10-CM

## 2020-04-24 DIAGNOSIS — F5105 Insomnia due to other mental disorder: Secondary | ICD-10-CM

## 2020-04-24 DIAGNOSIS — R454 Irritability and anger: Secondary | ICD-10-CM | POA: Diagnosis not present

## 2020-04-24 DIAGNOSIS — F411 Generalized anxiety disorder: Secondary | ICD-10-CM | POA: Diagnosis not present

## 2020-04-24 DIAGNOSIS — F319 Bipolar disorder, unspecified: Secondary | ICD-10-CM

## 2020-04-24 DIAGNOSIS — F3162 Bipolar disorder, current episode mixed, moderate: Secondary | ICD-10-CM

## 2020-04-24 MED ORDER — ZALEPLON 10 MG PO CAPS: ORAL_CAPSULE | ORAL | 5 refills | Status: DC

## 2020-04-24 MED ORDER — OXCARBAZEPINE 600 MG PO TABS: 600.0000 mg | ORAL_TABLET | Freq: Two times a day (BID) | ORAL | 1 refills | Status: DC

## 2020-04-24 MED ORDER — LAMOTRIGINE 150 MG PO TABS: ORAL_TABLET | ORAL | 1 refills | Status: DC

## 2020-04-24 NOTE — Progress Notes (Signed)
[facility]  Patient ID: Tristan [name],  MRN: [id number]  PCP: Patient, No Pcp Per  Date of Evaluation: 04/24/2020 Time spent:30 minutes  Chief Complaint:  Chief Complaint    Anxiety; Depression; Insomnia; Follow-up      HISTORY/CURRENT STATUS: HPI For routine med check.  Has been a lot more forgetful in the past month. Has always been 'absent-minded' but it's much worse. He'll forget and throw away food he'd just bought at the grocery store. "It's never been this bad." Not sure there's a trigger but he and his wife had a rescue dog that was very sick, they had trouble for the entire month with it, and then had to put it down. Hasn't really thought that it caused the forgetfulness.  When he gets extremely angry, he has started taking Valium, usually just 1/2 which helps him calm down.  It does not cause drowsiness whereas the hydroxyzine does.  He still very hesitant to take the Valium.  Not really enjoying things.  Energy and motivation are low.  Gets angry easily. Over things that he shouldn't. He started feeling frustrated about the dog situation, feeling like the people who rescue the dogs and foster them out might not have been as honest with him as they initially thought.  That dog had been very traumatized and never got to a point where she would trust the patient.  It did trust his wife a little, but it had multiple medical problems and neurologic issues as well, which they think now was dementia.  His wife [name] has been very down about that situation which then turns causes him to be more sad and anxious.  He has not been sleeping as well because he is on high alert thinking that dog may need him.  He is isolating and he cries easily.  Denies suicidal or homicidal thoughts.  Patient denies increased energy with decreased need for sleep, no increased talkativeness, no racing thoughts, no impulsivity or risky behaviors, no increased spending, no increased libido, no  grandiosity, no paranoia,and no hallucinations.  Denies dizziness, syncope, seizures, numbness, tingling, tremor, tics, unsteady gait, slurred speech, confusion. Denies muscle or joint pain, stiffness, or dystonia.  Individual Medical History/ Review of Systems: Changes? :No    Past medications for mental health diagnoses include: Celexa, Lamictal, Lexapro, Seroquel, prazosin, Trileptal, trazodone, Vistaril  Allergies: Patient has no known allergies.  Current Medications:  Current Outpatient Medications:  .  busPIRone (BUSPAR) 15 MG tablet, 3 po q am, 2 q hs., Disp: 450 tablet, Rfl: 1 .  diazepam (VALIUM) 5 MG tablet, TAKE 1 TABLET (5 MG TOTAL) BY MOUTH EVERY 8 (EIGHT) HOURS AS NEEDED FOR ANXIETY., Disp: 90 tablet, Rfl: 1 .  hydrOXYzine (ATARAX/VISTARIL) 25 MG tablet, TAKE 1-2 TABLETS (25-50 MG TOTAL) BY MOUTH EVERY 6 (SIX) HOURS AS NEEDED FOR ANXIETY., Disp: 240 tablet, Rfl: 5 .  lamoTRIgine (LAMICTAL) 150 MG tablet, TAKE 1.5 pills bid., Disp: 225 tablet, Rfl: 1 .  zaleplon (SONATA) 10 MG capsule, 1 po qhs prn sleep and may repeat 1 for midnocturnal awakening prn if has 3 hours left to sleep, Disp: 30 capsule, Rfl: 5 .  oxcarbazepine (TRILEPTAL) 600 MG tablet, Take 1 tablet (600 mg total) by mouth 2 (two) times daily., Disp: 60 tablet, Rfl: 1 Medication Side Effects: none  Family Medical/ Social History: Changes?  No  MENTAL HEALTH EXAM:  There were no vitals taken for this visit.There is no height or weight on file to calculate BMI.  General Appearance: Casual, Neat and Well Groomed  Eye Contact:  Good  Speech:  Clear and Coherent and Normal Rate  Volume:  Normal  Mood:  Depressed  Affect:  Depressed  Thought Process:  Goal Directed and Descriptions of Associations: Intact  Orientation:  Full (Time, Place, and Person)  Thought Content: Logical   Suicidal Thoughts:  No  Homicidal Thoughts:  No  Memory:  Immediate, recent, and remote memory is fair, that has worsened over the  past month.  Judgement:  Good  Insight:  Good  Psychomotor Activity:  Normal  Concentration:  Concentration: Fair and Attention Span: Fair  Recall:  Good  Fund of Knowledge: Good  Language: Good  Assets:  Desire for Improvement  ADL's:  Intact  Cognition: WNL  Prognosis:  Good    DIAGNOSES:    ICD-10-CM   1. Bipolar 1 disorder, mixed, moderate (HCC)  F31.62   2. Bipolar I disorder (HCC)  F31.9   3. Irritability and anger  R45.4   4. Generalized anxiety disorder  F41.1   5. Insomnia due to other mental disorder  F51.05    F99     Receiving Psychotherapy: Yes with Dr. [name]   RECOMMENDATIONS:  PDMP was reviewed. I provided 30 minutes of face-to-face time during this encounter. We discussed options for the different symptoms.  Some of the symptoms I believe are coming from the trauma from the illness of their rescue animal.  I recommend increasing the Lamictal as well as Trileptal to help with both the anger and irritability and depression.  Increasing the Lamictal will put him 50 mg over our usual maximum dose for bipolar depression.  It is a safe dose as it is prescribed at higher doses for seizure disorder.  This will help with the depressive symptoms.   Continue BuSpar 15 mg, 3 p.o. every morning, 2 p.o. nightly. Continue Valium 5 mg, 1 p.o. 3 times daily as needed. Continue hydroxyzine 25 mg, 1-2 every 6 hours as needed anxiety. Increase Lamictal to 1.5 pills bid Increase Trileptal to 600 mg, 1 p.o. twice daily.  (He has 300 mg pills and will take 2 p.o. twice daily until his supply runs out.) Continue Sonata 10 mg, 1 p.o. nightly as needed and may repeat 1 for mid nocturnal awakening as needed as long as he has 3 hours left to sleep. Continue therapy with Dr. [name]. Return in 4 weeks.  Melony Overly, PA-C

## 2020-04-25 ENCOUNTER — Ambulatory Visit (INDEPENDENT_AMBULATORY_CARE_PROVIDER_SITE_OTHER): Payer: BC Managed Care – PPO | Admitting: Psychiatry

## 2020-04-25 DIAGNOSIS — Z8659 Personal history of other mental and behavioral disorders: Secondary | ICD-10-CM

## 2020-04-25 DIAGNOSIS — G4700 Insomnia, unspecified: Secondary | ICD-10-CM | POA: Diagnosis not present

## 2020-04-25 DIAGNOSIS — F319 Bipolar disorder, unspecified: Secondary | ICD-10-CM

## 2020-04-25 DIAGNOSIS — F411 Generalized anxiety disorder: Secondary | ICD-10-CM | POA: Diagnosis not present

## 2020-04-25 DIAGNOSIS — Z630 Problems in relationship with spouse or partner: Secondary | ICD-10-CM

## 2020-04-25 NOTE — Progress Notes (Signed)
Psychotherapy Progress Note [facility] [name], PhD LP  Patient ID: Tristan [name]     MRN: [id number] Therapy format: Individual psychotherapy Date: 04/25/2020      Start: 8:15a     Stop: 9:05a     Time Spent: 50 min Location: In-person   Session narrative (presenting needs, interim history, self-report of stressors and symptoms, applications of prior therapy, status changes, and interventions made in session) Drama at home since last here.  Posted picture of finding something great for his art work, Tristan [name] was among the first to respond and tagged it with a heart, Tristan [name] blew a jealous/threatened gasket, which turned to 2 days of silence, and he had to tell Tristan [name] he'll have to unfriend her for the sake of peace at home.  Feeling more on pins and needles with Tristan [name] since, both for not wanting anything more to upset her and for feeling the unfairness and double standard of picking up responsibility for her emotional upset without feeling the favor returned.  New dog Tristan [name] has turned out very troublesome, too, with health issues, 3x in a month veterinarian, poor bonding with the other dogs and Tristan [name], plus vivid neurological signs including getting lost, running into things, eventually nipping at [place].  Couple were divided about whether to adopt such a troubled dog in the first place and then about whether it is kinder to keep her or give up.  Figured out she has dementia, and on veterinary advice agreed to put her down once it was obvious it was cruel to her to try further.  Tristan [name] her in with full faith in the emergency vet they've used before, but had a very painful wait with her nervous, shaking continuously, and then were told they would have to leave her when they had committed to be present for care and dignity.  Grueling, tearful process.  Last weekend also the anniversary of Tristan [name]'s F dying.  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  Personally, Pt on pins and needles often, and sleep not yet recovered yet from Tristan [name]'s time with them, which meant being chronically disturbed during the night.  Finds himself getting irritable, early on in the day.  Clear enough that the pattern of life at home is lonely and burdened these days.  Support/empathy provided.   Meanwhile, old friend Tristan [name], returned to church a decade ago, which Pt can respect, but he also latched onto an arrogant, unthinking, authoritarian faith, which distanced them.  A month ago Tristan [name] belatedly revealed his wife left in May, with the kids, obliterating his family hopes, and Pt as seen him in a heartbroken state, loads of pictures around, and now in imminent danger of losing his house.  Worries he may become suicidal.  Offers made to Tristan [name] to come over, get out of his house, which is loaded with pictures of his lost family.  Has also made offers to help him sell collectibles to make ends meet, working on the possibility of offering him refuge at their house if he can't figure out a noncatastrophic way to get out from under the house.  Treacherous story, too,  in that his ex-wife did the same to her first husband and was enriched by it.  Alarmed for Tristan [name] that he wake up an d take care of his own interests before the damage gets too great.  Support/empathy provided. Briefly discussed ways to approach Tristan [name] to motivate, largely a "We love you too much to keep from telling you..." approach.  Therapeutic modalities: Cognitive Behavioral Therapy, Solution-Oriented/Positive Psychology and Assertiveness/Communication  Mental Status/Observations:  Appearance:   Casual      Behavior:  Appropriate  Motor:  Normal  Speech/Language:   Clear and Coherent  Affect:  Appropriate  Mood:  anxious and dysthymic  Thought process:  normal  Thought content:    WNL  Sensory/Perceptual disturbances:    WNL  Orientation:  Fully oriented  Attention:  Good    Concentration:  Good  Memory:  WNL  Insight:    Good  Judgment:   Good  Impulse Control:  Good   Risk Assessment: Danger to Self: No Self-injurious Behavior: No Danger to Others: No Physical Aggression / Violence: No Duty to Warn: No Access to Firearms a concern: No  Assessment of progress:  situational setback(s)  Diagnosis:   ICD-10-CM   1. Bipolar I disorder (HCC)  F31.9   2. Generalized anxiety disorder  F41.1   3. Insomnia, unspecified type  G47.00   4. History of posttraumatic stress disorder (PTSD)  Z86.59   5. Relationship problem between partners  Z63.0    Plan:  Marland Kitchen Approach Tristan [name] as moved to offer encouragement, shelter, partnership  . Continuing openness to conjoint sessions to help address issues of jealousy, conflict avoidance, and inequitable emotional demands . Other recommendations/advice as may be noted above . Continue to utilize previously learned skills ad lib . Maintain medication as prescribed and work faithfully with relevant prescriber(s) if any changes are desired or seem indicated . Call the clinic on-call service, present to ER, or call 911 if any life-threatening psychiatric crisis Return in about 1 month (around 05/25/2020) for time at discretion, available earlier @ PT's need. . Already scheduled visit in this office 05/29/2020.  [name], PhD [name], PhD LP Clinical Psychologist, [facility] [address] [place], [place] [address] [phone]

## 2020-05-20 ENCOUNTER — Other Ambulatory Visit: Payer: Self-pay | Admitting: Physician Assistant

## 2020-05-21 NOTE — Telephone Encounter (Signed)
Please review

## 2020-05-27 ENCOUNTER — Other Ambulatory Visit: Payer: Self-pay | Admitting: Physician Assistant

## 2020-05-29 ENCOUNTER — Other Ambulatory Visit: Payer: Self-pay

## 2020-05-29 ENCOUNTER — Ambulatory Visit (INDEPENDENT_AMBULATORY_CARE_PROVIDER_SITE_OTHER): Payer: BC Managed Care – PPO | Admitting: Physician Assistant

## 2020-05-29 ENCOUNTER — Encounter: Payer: Self-pay | Admitting: Physician Assistant

## 2020-05-29 DIAGNOSIS — G4700 Insomnia, unspecified: Secondary | ICD-10-CM | POA: Diagnosis not present

## 2020-05-29 DIAGNOSIS — F1210 Cannabis abuse, uncomplicated: Secondary | ICD-10-CM

## 2020-05-29 DIAGNOSIS — F319 Bipolar disorder, unspecified: Secondary | ICD-10-CM

## 2020-05-29 DIAGNOSIS — F411 Generalized anxiety disorder: Secondary | ICD-10-CM | POA: Diagnosis not present

## 2020-05-29 MED ORDER — ZOLPIDEM TARTRATE 10 MG PO TABS: 5.0000 mg | ORAL_TABLET | Freq: Every evening | ORAL | 1 refills | Status: DC | PRN

## 2020-05-29 MED ORDER — DIAZEPAM 5 MG PO TABS: 5.0000 mg | ORAL_TABLET | Freq: Three times a day (TID) | ORAL | 0 refills | Status: DC | PRN

## 2020-05-29 NOTE — Telephone Encounter (Signed)
review 

## 2020-05-29 NOTE — Progress Notes (Signed)
[facility]  Patient ID: Tristan [name],  MRN: [id number]  PCP: Patient, No Pcp Per  Date of Evaluation: 05/29/2020 Time spent:20 minutes  Chief Complaint:  Chief Complaint    Anxiety; Depression; Insomnia      HISTORY/CURRENT STATUS: For routine med check.  Not sleeping well. Is only getting about 5-6 hours of sleep.  Seems to be going to sleep around 10 at the latest and then wakes up anywhere from 2:30 to 4:30 AM and is unable to go back to sleep.  He has used the [employer] a few times mid nocturnally but he feels more groggy the next day.  Plus his pharmacy will only give him 30 pills/month and if he did take it twice per night as directed, he would run out earlier.  He does his best to get off electronic devices no later than 7.  He will read a physical book sometimes which helps him relax.  He turns the lights out and sometimes takes a relaxing bath.  He uses the blue blocker glasses some of the time.  Caffeine is not an issue.  Anxiety is still a big problem.  He is needing the Valium 3 times a day, and if he can "get ahead of the anxiety" it is effective.  He is weaning himself off of marijuana, no longer smoking but is using edibles and that does cause more anxiety right now too.  States he does not want to go from 1 addiction to another, referring to Valium or any other benzodiazepine.  He has been hesitant to take the Valium in the past but I did encourage him at the last visit to do so when needed.  It does help, but again he does not like to need it, if at all possible.  Still sad, but not quite as bad. Energy and motivation are some better.  Still has trouble enjoying things but states he has enjoyed podcast recently and reading.  Still isolates but not any worse than normal.  Denies suicidal or homicidal thoughts.  Patient denies increased energy with decreased need for sleep, no increased talkativeness, no racing thoughts, no impulsivity or risky behaviors, no  increased spending, no increased libido, no grandiosity, no paranoia,and no hallucinations.  Denies dizziness, syncope, seizures, numbness, tingling, tremor, tics, unsteady gait, slurred speech, confusion. Denies muscle or joint pain, stiffness, or dystonia.  Individual Medical History/ Review of Systems: Changes? :No    Past medications for mental health diagnoses include: Celexa, Lamictal, Lexapro, Seroquel, prazosin, Trileptal, trazodone, Vistaril, Sonata.   Allergies: Patient has no known allergies.  Current Medications:  Current Outpatient Medications:  .  busPIRone (BUSPAR) 15 MG tablet, 3 po q am, 2 q hs., Disp: 450 tablet, Rfl: 1 .  diazepam (VALIUM) 5 MG tablet, Take 1 tablet (5 mg total) by mouth every 8 (eight) hours as needed for anxiety., Disp: 90 tablet, Rfl: 0 .  hydrOXYzine (ATARAX/VISTARIL) 25 MG tablet, TAKE 1-2 TABLETS (25-50 MG TOTAL) BY MOUTH EVERY 6 (SIX) HOURS AS NEEDED FOR ANXIETY., Disp: 240 tablet, Rfl: 5 .  lamoTRIgine (LAMICTAL) 150 MG tablet, TAKE 1.5 pills bid., Disp: 225 tablet, Rfl: 1 .  oxcarbazepine (TRILEPTAL) 600 MG tablet, TAKE 1 TABLET BY MOUTH TWICE A DAY, Disp: 180 tablet, Rfl: 1 .  zolpidem (AMBIEN) 10 MG tablet, Take 0.5-1 tablets (5-10 mg total) by mouth at bedtime as needed for sleep., Disp: 30 tablet, Rfl: 1 Medication Side Effects: none  Family Medical/ Social History: Changes?  No  MENTAL HEALTH EXAM:  There were no vitals taken for this visit.There is no height or weight on file to calculate BMI.  General Appearance: Casual, Neat and Well Groomed  Eye Contact:  Good  Speech:  Clear and Coherent and Normal Rate  Volume:  Normal  Mood:  Euthymic  Affect:  Appropriate  Thought Process:  Goal Directed and Descriptions of Associations: Intact  Orientation:  Full (Time, Place, and Person)  Thought Content: Logical   Suicidal Thoughts:  No  Homicidal Thoughts:  No  Memory:  Immediate, recent, and remote memory is fair, that has worsened  over the past month.  Judgement:  Good  Insight:  Good  Psychomotor Activity:  Normal  Concentration:  Concentration: Fair and Attention Span: Fair  Recall:  Good  Fund of Knowledge: Good  Language: Good  Assets:  Desire for Improvement  ADL's:  Intact  Cognition: WNL  Prognosis:  Good    DIAGNOSES:    ICD-10-CM   1. Insomnia, unspecified type  G47.00   2. Bipolar I disorder (HCC)  F31.9   3. Generalized anxiety disorder  F41.1   4. Tetrahydrocannabinol (THC) use disorder, mild, abuse  F12.10     Receiving Psychotherapy: Yes with Dr. [name]   RECOMMENDATIONS:  PDMP was reviewed. I provided 20 minutes of face-to-face time during this encounter. His mood seems a little better since increasing the Lamictal and Trileptal at the last visit.  We should leave those the same. Insomnia is still an issue and the Kathaleen Bury is not effective enough.  I recommend we change to either Ambien or Lunesta.  Benefits, side effects and risk were discussed and he accepts. Continue to decrease marijuana with goal to quit altogether. Discontinue Sonata. Start Ambien 10 mg, 1/2-1 p.o. nightly as needed sleep. Continue BuSpar 15 mg, 3 p.o. every morning and 2 p.o. nightly. Continue Valium 5 mg, 1 p.o. 3 times daily as needed. Continue hydroxyzine 25 mg, 1-2 every 6 hours as needed anxiety. Continue Lamictal 150 mg, 1.5 pills bid Continue Trileptal 600 mg, 1 p.o. twice daily.  (He has 300 mg pills and will take 2 p.o. twice daily until his supply runs out.) Continue therapy with Dr. [name]. Return in 4 weeks.  Melony Overly, PA-C

## 2020-05-30 ENCOUNTER — Ambulatory Visit (INDEPENDENT_AMBULATORY_CARE_PROVIDER_SITE_OTHER): Payer: BC Managed Care – PPO | Admitting: Psychiatry

## 2020-05-30 DIAGNOSIS — F319 Bipolar disorder, unspecified: Secondary | ICD-10-CM

## 2020-05-30 DIAGNOSIS — Z8659 Personal history of other mental and behavioral disorders: Secondary | ICD-10-CM

## 2020-05-30 DIAGNOSIS — F1210 Cannabis abuse, uncomplicated: Secondary | ICD-10-CM

## 2020-05-30 DIAGNOSIS — Z630 Problems in relationship with spouse or partner: Secondary | ICD-10-CM

## 2020-05-30 DIAGNOSIS — F411 Generalized anxiety disorder: Secondary | ICD-10-CM | POA: Diagnosis not present

## 2020-05-30 DIAGNOSIS — G4700 Insomnia, unspecified: Secondary | ICD-10-CM

## 2020-05-30 NOTE — Progress Notes (Signed)
Psychotherapy Progress Note [facility] [name], PhD LP  Patient ID: Tristan [name]     MRN: [id number] Therapy format: Individual psychotherapy Date: 05/30/2020      Start: 8:15a     Stop: 9:00a     Time Spent: 45 min Location: In-person   Session narrative (presenting needs, interim history, self-report of stressors and symptoms, applications of prior therapy, status changes, and interventions made in session) Continues with disturbed sleep, MNA even with Sonata.  Discussed sleep readiness tactics and referred to psychiatry.  Acknowledges missing intimacy and feeling he and Tristan [name] are drifting apart.  Sees Tristan [name] spending extra time in hobbies, friends, or despair.  His own loneliness and fear have been mounting.  Discussed and recommended several options and communication to help recruit more togetherness.  Trying to quit smoking pot.  Has turned to edibles, finds them measurable and reliable, and so far he is not experiencing cravings.  Has been in touch with [name] further, but sees little help as [name] seems hopelessly attached to his house and looks to be going down with the ship financially.  Support/empathy provided.   Therapeutic modalities: Cognitive Behavioral Therapy and Solution-Oriented/Positive Psychology  Mental Status/Observations:  Appearance:   Casual     Behavior:  Appropriate  Motor:  Normal  Speech/Language:   Clear and Coherent  Affect:  Appropriate  Mood:  anxious and dysthymic  Thought process:  normal  Thought content:    WNL  Sensory/Perceptual disturbances:    WNL  Orientation:  Fully oriented  Attention:  Good    Concentration:  Good  Memory:  WNL  Insight:    Good  Judgment:   Good  Impulse Control:  Good   Risk Assessment: Danger to Self: No Self-injurious Behavior: No Danger to Others: No Physical Aggression / Violence: No Duty to Warn: No Access to Firearms a concern: No  Assessment of progress:   stabilized  Diagnosis:   ICD-10-CM   1. Generalized anxiety disorder  F41.1   2. Bipolar I disorder (HCC)  F31.9   3. Insomnia, unspecified type  G47.00   4. Tetrahydrocannabinol (THC) use disorder, mild, abuse  F12.10   5. History of posttraumatic stress disorder (PTSD)  Z86.59   6. Relationship problem between partners  Z63.0    Plan:  . Try communication tactics with wife to recruit quality time . Other recommendations/advice as may be noted above . Continue to utilize previously learned skills ad lib . Maintain medication as prescribed and work faithfully with relevant prescriber(s) if any changes are desired or seem indicated . Call the clinic on-call service, present to ER, or call 911 if any life-threatening psychiatric crisis Return in about 1 month (around 06/30/2020). . Already scheduled visit in this office 06/29/2020.  [name], PhD [name], PhD LP Clinical Psychologist, [facility] [address] [place], [place] [address] [phone]

## 2020-06-05 ENCOUNTER — Other Ambulatory Visit: Payer: Self-pay | Admitting: Physician Assistant

## 2020-06-29 ENCOUNTER — Other Ambulatory Visit: Payer: Self-pay

## 2020-06-29 ENCOUNTER — Ambulatory Visit (INDEPENDENT_AMBULATORY_CARE_PROVIDER_SITE_OTHER): Payer: BC Managed Care – PPO | Admitting: Physician Assistant

## 2020-06-29 ENCOUNTER — Encounter: Payer: Self-pay | Admitting: Physician Assistant

## 2020-06-29 DIAGNOSIS — F319 Bipolar disorder, unspecified: Secondary | ICD-10-CM

## 2020-06-29 DIAGNOSIS — F411 Generalized anxiety disorder: Secondary | ICD-10-CM

## 2020-06-29 DIAGNOSIS — F1210 Cannabis abuse, uncomplicated: Secondary | ICD-10-CM | POA: Diagnosis not present

## 2020-06-29 DIAGNOSIS — G4700 Insomnia, unspecified: Secondary | ICD-10-CM

## 2020-06-29 DIAGNOSIS — Z8659 Personal history of other mental and behavioral disorders: Secondary | ICD-10-CM

## 2020-06-29 NOTE — Progress Notes (Signed)
[facility]  Patient ID: Tristan [name],  MRN: [id number]  PCP: Patient, No Pcp Per  Date of Evaluation: 06/29/2020 Time spent:20 minutes  Chief Complaint:  Chief Complaint    Anxiety; Depression; Insomnia      HISTORY/CURRENT STATUS: For routine med check.  He's not smoking pot now, but is using edibles to wean off.  If he goes too far in between the edibles, he does get kind of anxious.  He doesn't take the Valium very often but it is helpful when needed.  He takes the hydroxyzine and even more rarely because even though it helps it does make him super drowsy and sometimes he falls asleep.  He then has brain fog afterwards.  At the last visit we stopped the [facility] and started Ambien.  He wasn't able to get the Ambien right away because he still had some Sonata so he didn't start the Ambien until this week.  The first couple of nights he only took 5 mg which was not helpful.  Last night he took a whole 10 mg pill which did help him go to sleep but he still only got about 4 to 5 hours.  That is all he has been getting for some time now.  He has been able to enjoy things but he and his wife are still following COVID protocols.  They have not been out to eat or any activities in public for fun but a handful of times since March 2020.  Energy and motivation are fair to good.  Not isolating except due to COVID.  Does not cry easily.  States he does get fixated on things sometimes but it is way better than when he first started coming to see me and Dr. Farrel Demark.  Denies suicidal or homicidal thoughts.  Patient denies increased energy with decreased need for sleep, no increased talkativeness, no racing thoughts, no impulsivity or risky behaviors, no increased spending, no increased libido, no grandiosity, no paranoia,and no hallucinations.  Denies dizziness, syncope, seizures, numbness, tingling, tremor, tics, unsteady gait, slurred speech, confusion. Denies muscle or joint pain,  stiffness, or dystonia.  Individual Medical History/ Review of Systems: Changes? :No    Past medications for mental health diagnoses include: Celexa, Lamictal, Lexapro, Seroquel, prazosin, Trileptal, trazodone, Vistaril, Sonata.   Allergies: Patient has no known allergies.  Current Medications:  Current Outpatient Medications:  .  busPIRone (BUSPAR) 15 MG tablet, TAKE 3 TABLETS IN THE MORNING AND 2 TABLETS AT BEDTIME, Disp: 450 tablet, Rfl: 1 .  diazepam (VALIUM) 5 MG tablet, Take 1 tablet (5 mg total) by mouth every 8 (eight) hours as needed for anxiety., Disp: 90 tablet, Rfl: 0 .  hydrOXYzine (ATARAX/VISTARIL) 25 MG tablet, TAKE 1-2 TABLETS (25-50 MG TOTAL) BY MOUTH EVERY 6 (SIX) HOURS AS NEEDED FOR ANXIETY., Disp: 240 tablet, Rfl: 5 .  lamoTRIgine (LAMICTAL) 150 MG tablet, TAKE 1.5 pills bid., Disp: 225 tablet, Rfl: 1 .  oxcarbazepine (TRILEPTAL) 600 MG tablet, TAKE 1 TABLET BY MOUTH TWICE A DAY, Disp: 180 tablet, Rfl: 1 .  zolpidem (AMBIEN) 10 MG tablet, Take 0.5-1 tablets (5-10 mg total) by mouth at bedtime as needed for sleep., Disp: 30 tablet, Rfl: 1 Medication Side Effects: none  Family Medical/ Social History: Changes?  No  MENTAL HEALTH EXAM:  There were no vitals taken for this visit.There is no height or weight on file to calculate BMI.  General Appearance: Casual, Neat and Well Groomed  Eye Contact:  Good  Speech:  Clear and Coherent and Normal Rate  Volume:  Normal  Mood:  Euthymic  Affect:  Appropriate  Thought Process:  Goal Directed and Descriptions of Associations: Intact  Orientation:  Full (Time, Place, and Person)  Thought Content: Logical   Suicidal Thoughts:  No  Homicidal Thoughts:  No  Memory:  He struggles sometimes with immediate memory like what he went into a room for.  Sometimes it can cause decreased memory for the last few days or week or so.  Judgement:  Good  Insight:  Good  Psychomotor Activity:  Normal  Concentration:  Concentration: Fair and  Attention Span: Fair  Recall:  Good  Fund of Knowledge: Good  Language: Good  Assets:  Desire for Improvement  ADL's:  Intact  Cognition: WNL  Prognosis:  Good    DIAGNOSES:    ICD-10-CM   1. Insomnia, unspecified type  G47.00   2. Bipolar I disorder (HCC)  F31.9   3. Tetrahydrocannabinol (THC) use disorder, mild, abuse  F12.10   4. History of posttraumatic stress disorder (PTSD)  Z86.59   5. Generalized anxiety disorder  F41.1     Receiving Psychotherapy: Yes with Dr. [name]   RECOMMENDATIONS:  PDMP was reviewed. I provided 20 minutes of face-to-face time during this encounter. We again discussed the insomnia.  He usually follows a good sleep hygiene regimen but he did not last night.  I believe when he is able to sleep better and feel more rested, the anxiety will improve.  Also of course when he is completely off cannabis that will help. If the Ambien is not effective with then another week to 10 days, he can stop that and take 2 Valium at night (10 mg) and if that is effective, he should give me a call.  I'll send in a new prescription.  Consider sending in a 10 mg prescription if that occurs. Overall his mood is stable so no changes in the mood stabilizers. Continue to decrease marijuana edibles.  Continue Ambien 10 mg, 1/2-1 p.o. nightly as needed sleep. Continue BuSpar 15 mg, 3 p.o. every morning and 2 p.o. nightly. Continue Valium 5 mg, 1 p.o. 3 times daily as needed. Continue hydroxyzine 25 mg, 1-2 every 6 hours as needed anxiety.  Uses it rarely. Continue Lamictal 150 mg, 1.5 pills bid Continue Trileptal 600 mg, 1 p.o. twice daily.  Continue therapy with Dr. [name]. Return in 6 weeks.  Melony Overly, PA-C

## 2020-07-04 ENCOUNTER — Ambulatory Visit (INDEPENDENT_AMBULATORY_CARE_PROVIDER_SITE_OTHER): Payer: BC Managed Care – PPO | Admitting: Psychiatry

## 2020-07-04 ENCOUNTER — Other Ambulatory Visit: Payer: Self-pay

## 2020-07-04 DIAGNOSIS — F411 Generalized anxiety disorder: Secondary | ICD-10-CM | POA: Diagnosis not present

## 2020-07-04 DIAGNOSIS — F319 Bipolar disorder, unspecified: Secondary | ICD-10-CM

## 2020-07-04 DIAGNOSIS — F1210 Cannabis abuse, uncomplicated: Secondary | ICD-10-CM | POA: Diagnosis not present

## 2020-07-04 DIAGNOSIS — Z630 Problems in relationship with spouse or partner: Secondary | ICD-10-CM

## 2020-07-04 DIAGNOSIS — Z8659 Personal history of other mental and behavioral disorders: Secondary | ICD-10-CM
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
## 2020-07-06 ENCOUNTER — Telehealth: Payer: Self-pay | Admitting: Physician Assistant

## 2020-07-06 NOTE — Telephone Encounter (Signed)
Pt has concerns that he is having side effects from the Ambien that the patient started last week. Pt experiencing diarrhea.

## 2020-07-07 NOTE — Telephone Encounter (Signed)
Please review, not usually side effect I've been reported about

## 2020-07-08 NOTE — Telephone Encounter (Signed)
Have him take 5 mg and with food, to see if that helps. I've never heard this as a SE either and wonder if there's something else going on. If diarrhea continues, stop Ambien and let me know. We can try another med, but he'll have to bring in the remainder of the Ambien for [place] to dispose of.

## 2020-07-09 NOTE — Telephone Encounter (Signed)
Rtc to patient and he did stop the Ambien and is doing better. He reports returning back to "Celexa" perhaps he meant Sonata. Reports he's doing well and having no complaints at this time. Will call back with any issues.

## 2020-07-09 NOTE — Telephone Encounter (Signed)
Reviewed

## 2020-07-25 ENCOUNTER — Ambulatory Visit (INDEPENDENT_AMBULATORY_CARE_PROVIDER_SITE_OTHER): Payer: BC Managed Care – PPO | Admitting: Psychiatry

## 2020-07-25 ENCOUNTER — Other Ambulatory Visit: Payer: Self-pay

## 2020-07-25 DIAGNOSIS — Z8659 Personal history of other mental and behavioral disorders: Secondary | ICD-10-CM

## 2020-07-25 DIAGNOSIS — Z630 Problems in relationship with spouse or partner: Secondary | ICD-10-CM | POA: Diagnosis not present

## 2020-07-25 DIAGNOSIS — F411 Generalized anxiety disorder: Secondary | ICD-10-CM | POA: Diagnosis not present

## 2020-07-25 DIAGNOSIS — F319 Bipolar disorder, unspecified: Secondary | ICD-10-CM
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
## 2020-08-08 ENCOUNTER — Other Ambulatory Visit: Payer: Self-pay

## 2020-08-08 ENCOUNTER — Ambulatory Visit (INDEPENDENT_AMBULATORY_CARE_PROVIDER_SITE_OTHER): Payer: BC Managed Care – PPO | Admitting: Psychiatry

## 2020-08-08 DIAGNOSIS — F411 Generalized anxiety disorder: Secondary | ICD-10-CM | POA: Diagnosis not present

## 2020-08-08 DIAGNOSIS — F319 Bipolar disorder, unspecified: Secondary | ICD-10-CM | POA: Diagnosis not present

## 2020-08-08 DIAGNOSIS — Z8659 Personal history of other mental and behavioral disorders: Secondary | ICD-10-CM | POA: Diagnosis not present

## 2020-08-08 DIAGNOSIS — Z630 Problems in relationship with spouse or partner: Secondary | ICD-10-CM | POA: Diagnosis not present

## 2020-08-08 NOTE — Progress Notes (Signed)
Psychotherapy Progress Note [facility] [name], PhD LP  Patient ID: Tristan [name]     MRN: [id number] Therapy format: Individual psychotherapy Date: 08/08/2020      Start: 8:15a     Stop: 9:05a     Time Spent: 50 min Location: In-person   Session narrative (presenting needs, interim history, self-report of stressors and symptoms, applications of prior therapy, status changes, and interventions made in session) Had thought would want to go into healing relationship and memory with his father this session but superseded by current concerns.  Holiday season this week overshadowed by numerous losses including beloved older neighbors and beloved, if complicated, dogs this year, including [name], [name]'s beloved from before their relationship and, in spirit, her first born child.  Then the foster/adoption who had numerous health problems when they took him and his needs heavily taxed their emotions and rest this summer.  In fact, they have taken in another foster dog now, from the same rescue organization that provided the very problematic previous dog.  With no shortage of irony, Tristan [name] was the one reluctant this time to adopt and Tristan [name] the one more favorable, but readily apparent in session that this was a joint attempt to love and provide again, not to mention pursue what amounts to posttraumatic exposure therapy.  Affirmed and encouraged in session.  By agreement, they are doing a 2-week trial period before deciding to adopt, though Tristan [name]'s take is that Tristan [name] has pretty well bonded this week.  Nevertheless, regarded as a good "do over" for approaching the decision rationally and collaboratively.  Tristan [name] struggles with shame chronically over impulsive emotional decisions she makes, and this season is overshadowed by her own grief and guilt at manipulating a pregnancy 3 years ago then facing the excruciating choice to abort.  As regards communication, Tristan [name] did have a moment of  some angry outbursts with the latest dog project, charging that she does not get to choose this time, but overall impression that he did not let his own tension actually become preemptive rage.  Generally encouraged in talking things out before they get big, continuing to recognize that each of them has anxiety and each of them means well, and trying to get ahead of impulses that could lead to crisis.  Recommend trying to empathize more vividly with Tristan [name] for knowing that she is struggling with guilt and anxiety reminded of losses, affirm to her if the moment seems right that it is brave of her to embrace the next dog after what they've been through.  Modeled ways of taking tense moments he would otherwise avoid or react preemptively to and speak empathy more openly.  Therapeutic modalities: Cognitive Behavioral Therapy, Solution-Oriented/Positive Psychology and Assertiveness/Communication  Mental Status/Observations:  Appearance:   Casual     Behavior:  Appropriate  Motor:  Normal  Speech/Language:   Clear and Coherent  Affect:  Appropriate  Mood:  dysthymic  Thought process:  normal  Thought content:    WNL  Sensory/Perceptual disturbances:    WNL  Orientation:  Fully oriented  Attention:  Good    Concentration:  Good  Memory:  WNL  Insight:    Good  Judgment:   Good  Impulse Control:  Good   Risk Assessment: Danger to Self: No Self-injurious Behavior: No Danger to Others: No Physical Aggression / Violence: No Duty to Warn: No Access to Firearms a concern: No  Assessment of progress:  progressing  Diagnosis:   ICD-10-CM   1.  Bipolar I disorder (HCC)  F31.9   2. Generalized anxiety disorder  F41.1   3. Relationship problem between partners  Z63.0   4. History of posttraumatic stress disorder (PTSD)  Z86.59    Plan:  . Focused recommendations for communicating with wife  . Other recommendations/advice as may be noted above . Continue to utilize previously learned skills ad  lib . Maintain medication as prescribed and work faithfully with relevant prescriber(s) if any changes are desired or seem indicated . Call the clinic on-call service, present to ER, or call 911 if any life-threatening psychiatric crisis Return in about 2 weeks (around 08/22/2020). . Already scheduled visit in this office 08/22/2020.  [name], PhD [name], PhD LP Clinical Psychologist, [facility] [address], [address] [place], [place] [address] [phone]

## 2020-08-22 ENCOUNTER — Ambulatory Visit (INDEPENDENT_AMBULATORY_CARE_PROVIDER_SITE_OTHER): Payer: BC Managed Care – PPO | Admitting: Physician Assistant

## 2020-08-22 ENCOUNTER — Encounter: Payer: Self-pay | Admitting: Physician Assistant

## 2020-08-22 ENCOUNTER — Other Ambulatory Visit: Payer: Self-pay

## 2020-08-22 DIAGNOSIS — F4321 Adjustment disorder with depressed mood: Secondary | ICD-10-CM

## 2020-08-22 DIAGNOSIS — F319 Bipolar disorder, unspecified: Secondary | ICD-10-CM | POA: Diagnosis not present

## 2020-08-22 DIAGNOSIS — G4700 Insomnia, unspecified: Secondary | ICD-10-CM | POA: Diagnosis not present

## 2020-08-22 DIAGNOSIS — F411 Generalized anxiety disorder: Secondary | ICD-10-CM

## 2020-08-22 DIAGNOSIS — F1210 Cannabis abuse, uncomplicated: Secondary | ICD-10-CM

## 2020-08-22 MED ORDER — ZALEPLON 10 MG PO CAPS: ORAL_CAPSULE | ORAL | 1 refills | Status: DC

## 2020-08-22 MED ORDER — HYDROXYZINE HCL 25 MG PO TABS
25.0000 mg | ORAL_TABLET | Freq: Four times a day (QID) | ORAL | 5 refills | Status: DC | PRN
Start: 2020-08-22 — End: 2021-10-16

## 2020-08-22 NOTE — Progress Notes (Signed)
[facility]  Patient ID: Tristan [name],  MRN: [id number]  PCP: Patient, No Pcp Per  Date of Evaluation: 08/22/2020 Time spent:20 minutes  Chief Complaint:  Chief Complaint    Anxiety; Depression; Insomnia      HISTORY/CURRENT STATUS: HPI for routine med check.  Lost a their dog on 08/10/2020. They had her since she was a puppy and she was 15 now. She died in his arms. Has been very sad, but trying to get through it the best he can.  As far as his psych medications go they are working well.  He has been sleeping better with the Sonata.  At least up until his dog passed away.  Now he is kind of on "high alert" listening out to see if any of his other dogs need him.  The Ambien did not work at all.  Taking the hydroxyzine does help him relax and decrease his racing thoughts.  He alternates that with the Valium for the anxiety.  The anxiety has not been as well controlled due to the grief.  He has started using CBD gummy's again.  Has been unable to stop cannabis completely at this time.  That is at goal however.  Patient denies loss of interest in usual activities and is able to enjoy things.  Grief does limit those things however.  Denies decreased energy or motivation.  Denies suicidal or homicidal thoughts.  Patient denies increased energy with decreased need for sleep, no increased talkativeness, no racing thoughts, no impulsivity or risky behaviors, no increased spending, no increased libido, no grandiosity, no increased irritability or anger, and no hallucinations.  Denies dizziness, syncope, seizures, numbness, tingling, tremor, tics, unsteady gait, slurred speech, confusion. Denies muscle or joint pain, stiffness, or dystonia.   Individual Medical History/ Review of Systems: Changes? :No    Past medications for mental health diagnoses include: Celexa, Lamictal, Lexapro, Seroquel, prazosin, Trileptal, trazodone, Vistaril, Sonata.   Allergies: Patient has no  known allergies.  Current Medications:  Current Outpatient Medications:  .  busPIRone (BUSPAR) 15 MG tablet, TAKE 3 TABLETS IN THE MORNING AND 2 TABLETS AT BEDTIME, Disp: 450 tablet, Rfl: 1 .  diazepam (VALIUM) 5 MG tablet, Take 1 tablet (5 mg total) by mouth every 8 (eight) hours as needed for anxiety., Disp: 90 tablet, Rfl: 0 .  lamoTRIgine (LAMICTAL) 150 MG tablet, TAKE 1.5 pills bid., Disp: 225 tablet, Rfl: 1 .  oxcarbazepine (TRILEPTAL) 600 MG tablet, TAKE 1 TABLET BY MOUTH TWICE A DAY, Disp: 180 tablet, Rfl: 1 .  hydrOXYzine (ATARAX/VISTARIL) 25 MG tablet, Take 1-2 tablets (25-50 mg total) by mouth every 6 (six) hours as needed for anxiety., Disp: 240 tablet, Rfl: 5 .  zaleplon (SONATA) 10 MG capsule, One po qhs prn sleep and may repeat 1 for MNA if has 3 hours left to sleep prn., Disp: 60 capsule, Rfl: 1 Medication Side Effects: none  Family Medical/ Social History: Changes? Yes lost another dog before Christmas.   MENTAL HEALTH EXAM:  There were no vitals taken for this visit.There is no height or weight on file to calculate BMI.  General Appearance: Casual, Neat and Well Groomed  Eye Contact:  Good  Speech:  Clear and Coherent and Normal Rate  Volume:  Normal  Mood:  sad  Affect:  Tearful and sad discussing the death of his dog.   Thought Process:  Goal Directed and Descriptions of Associations: Intact  Orientation:  Full (Time, Place, and Person)  Thought Content: Logical  Suicidal Thoughts:  No  Homicidal Thoughts:  No  Memory:  WNL  Judgement:  Good  Insight:  Good  Psychomotor Activity:  Normal  Concentration:  Concentration: Good  Recall:  Good  Fund of Knowledge: Good  Language: Good  Assets:  Desire for Improvement  ADL's:  Intact  Cognition: WNL  Prognosis:  Good    DIAGNOSES:    ICD-10-CM   1. Grief  F43.21   2. Bipolar I disorder (HCC)  F31.9   3. Insomnia, unspecified type  G47.00   4. Generalized anxiety disorder  F41.1   5. Tetrahydrocannabinol  (THC) use disorder, mild, abuse  F12.10     Receiving Psychotherapy: Yes Dr. Mardelle Matte Mitchum   RECOMMENDATIONS:  PDMP was reviewed. I provided 20 minutes of face-to-face time during this encounter, in which we discussed grief and its stages.  We also spent time discussing the CBD.  We agreed that the goal is for him to completely stop, but I understand under the circumstances that it is a difficult time to do so. No changes in meds except for increasing the Sonata to include a mid nocturnal dose if needed. Continue BuSpar 15 mg, 3 p.o. every morning and 2 p.o. nightly. Continue Valium 5 mg, 1 p.o. nightly 8H as needed anxiety. Continue hydroxyzine 25 mg, 1-2 p.o. every 6 hours as needed anxiety. Continue Lamictal 150 mg, 1.5 pills p.o. twice daily. Continue Trileptal 600 mg, 1 p.o. twice daily. Continue Sonata 10 mg, 1 p.o. nightly as needed sleep and may repeat 1 for mid nocturnal awakening as long as he has 3 hours left to sleep. Continue therapy with Dr. [name]. Return in 4 to 6 weeks.  Melony Overly, PA-C

## 2020-08-29 ENCOUNTER — Ambulatory Visit (INDEPENDENT_AMBULATORY_CARE_PROVIDER_SITE_OTHER): Payer: BC Managed Care – PPO | Admitting: Psychiatry

## 2020-08-29 ENCOUNTER — Other Ambulatory Visit: Payer: Self-pay

## 2020-08-29 DIAGNOSIS — F319 Bipolar disorder, unspecified: Secondary | ICD-10-CM

## 2020-08-29 DIAGNOSIS — Z8659 Personal history of other mental and behavioral disorders: Secondary | ICD-10-CM | POA: Diagnosis not present

## 2020-08-29 DIAGNOSIS — F4321 Adjustment disorder with depressed mood: Secondary | ICD-10-CM | POA: Diagnosis not present

## 2020-08-29 NOTE — Progress Notes (Signed)
Psychotherapy Progress Note [facility] [name], PhD LP  Patient ID: Tristan [name]     MRN: [id number] Therapy format: Individual psychotherapy Date: 08/29/2020      Start: 8:14a     Stop: 9:04a     Time Spent: 50 min Location: In-person   Session narrative (presenting needs, interim history, self-report of stressors and symptoms, applications of prior therapy, status changes, and interventions made in session) Dog [name] -- a rescue of 5 yrs -- died 41 Eve.  History of great efforts to get her healthy to begin with, astonishingly fat when adopted, worked through weight loss and rehabilitation.  3rd pet loss in 6 months.  This dog had bonded very well, deep source of unconditional love for Tristan [name].  Gruesome, helpless story taking her to the emergency vet, feeling her go limp and evacuate on the way, trying to resuscitate her, dying in his arms.  Further difficulties with discrepancies with wife in emotional processing to the effect that he feels he mostly cannot afford to express his own grief for long-established need to offset her own high-strung states  Discussed approaching [name] about asking a turn at expressing his grief and making it clear no expectations on her to fix the feelings, just be present, hear out, comfort with touch, just needs his turn.  Encouraged to make use of any artistic outlet, writing, private place to yell if desired, some expressive freedom PRN.  In session, conducted psychodramatic roleplay of encountering [name] in an afterlife existence to express his feelings, ask questions, hear back affirmation, blessing, and reframe issues related to (false) guilt.  Interpreted guilt as undeserved and mislabelled pain of loss, made clear that from the sound of his story and memories he had loved spectacularly, and reframed those final moments as breaking his own heart trying to save hers, and something she noticed and wants to thank him for.  Established  openness to continue psychodrama on his own or in session, ad lib.  Therapeutic modalities: Cognitive Behavioral Therapy, Solution-Oriented/Positive Psychology and Gestalt/Psychodrama  Mental Status/Observations:  Appearance:   Casual     Behavior:  Appropriate  Motor:  Normal  Speech/Language:   Clear and Coherent  Affect:  Appropriate and Tearful  Mood:  sad  Thought process:  normal  Thought content:    WNL  Sensory/Perceptual disturbances:    WNL  Orientation:  Fully oriented  Attention:  Good    Concentration:  Good  Memory:  WNL  Insight:    Good  Judgment:   Good  Impulse Control:  Good   Risk Assessment: Danger to Self: No Self-injurious Behavior: No Danger to Others: No Physical Aggression / Violence: No Duty to Warn: No Access to Firearms a concern: No  Assessment of progress:  progressing overall, normal grief in tragic circumstances  Diagnosis:   ICD-10-CM   1. Grief  F43.21   2. Bipolar I disorder (HCC)  F31.9   3. History of posttraumatic stress disorder (PTSD)  Z86.59    Plan:  . Self-affirm love known and love shown . Develop psychodrama interaction with [name] as needed . Approach wife as interested for sharing and comfort on request -- don't assume the need to not have feelings to balance the household emotionally, as assuming this exaggerates pain, loneliness, and sets up for resentment by reenacting the dynamics of his FOO . Other recommendations/advice as may be noted above . Continue to utilize previously learned skills ad lib . Maintain medication as prescribed  and work faithfully with relevant prescriber(s) if any changes are desired or seem indicated . Call the clinic on-call service, present to ER, or call 911 if any life-threatening psychiatric crisis Return for session(s) already scheduled. . Already scheduled visit in this office 09/11/2020.  [name], PhD [name], PhD LP Clinical Psychologist, [facility]  Group [facility] [address], [address] [place], [place] [address] [phone]

## 2020-09-10 ENCOUNTER — Other Ambulatory Visit: Payer: Self-pay | Admitting: Physician Assistant

## 2020-09-11 ENCOUNTER — Other Ambulatory Visit: Payer: Self-pay

## 2020-09-11 ENCOUNTER — Ambulatory Visit (INDEPENDENT_AMBULATORY_CARE_PROVIDER_SITE_OTHER): Payer: BC Managed Care – PPO | Admitting: Psychiatry

## 2020-09-11 DIAGNOSIS — Z630 Problems in relationship with spouse or partner: Secondary | ICD-10-CM

## 2020-09-11 DIAGNOSIS — Z636 Dependent relative needing care at home: Secondary | ICD-10-CM

## 2020-09-11 DIAGNOSIS — F319 Bipolar disorder, unspecified: Secondary | ICD-10-CM | POA: Diagnosis not present

## 2020-09-11 DIAGNOSIS — F411 Generalized anxiety disorder: Secondary | ICD-10-CM | POA: Diagnosis not present

## 2020-09-11 DIAGNOSIS — Z8659 Personal history of other mental and behavioral disorders: Secondary | ICD-10-CM

## 2020-09-11 NOTE — Progress Notes (Signed)
Psychotherapy Progress Note [facility] [name], PhD LP  Patient ID: Tristan [name]     MRN: [id number] Therapy format: Individual psychotherapy Date: 09/11/2020      Start: 8:15a     Stop: 9:04a     Time Spent: 49 min Location: In-person   Session narrative (presenting needs, interim history, self-report of stressors and symptoms, applications of prior therapy, status changes, and interventions made in session) Most concerned today about friend [name] of 25 years, man who seems to be going downhill and failing to apply himself to multiple challenges, including losing his marriage, being on the verge of losing his house, getting laid off, managing sick dog, and languishing in weight gain, back pain, and what seems from the outside to be significant depression.  Worried also that his mother-in-law has begun to turn the kids against him, after his wife, who has noted issues including hoarding behavior and apparently engineered a surprise exit last spring, leaving [name] with the bills.  Continues to be stunned seeing his friend keep up pictures of the family and essentially wallow while accepting substantial efforts to bail him out.  [name], for her part, is involved herself trying to save his house by representing him to his loan company.  [name] just yesterday spent about 7 hours taking through an overstuffed shed and putting up with Brian's resistance trying to find valuable collectible toys in Brian's collection that could be sold quickly work for cash he needs to live on.  Despite essentially making him $1100 yesterday, found [name] not following through with the simplest of recommendations and is struggling with multiple feelings, including the beginning of feeling taken advantage of and fear that [name] will blow up on seeing her work taken for granted.  He is concerned that he may reach more of a boiling point where he recites a list of criticisms against [name] and fractures  the friendship.  Discussed his clearest concerns and most important messages and framed constructive confrontation, focusing on several key messages:  I love you too much to keep from telling you this  I see trouble coming from how you are handling this.  Will you let me show you?  Help me see what it is you want to have happen  Sounds good, but how?  The further this goes (naysaying help, clinging to things it's clear won't work out, and allowing friends like me to do the heavy lifting while you sit out), the more it looks like you're giving up on my friend.  That feels unfair to both of [place].  Discussed options for practicing uncomfortable messages, possibly with [name], possibly with another friend, or in imagination with [place].  Even if concerned betsy might feel too close to burning out (she has now requested referral to psychiatry), advised still be willing to let her know that he is preparing to speak for both of them so that she can at least reduce the sense of being strung out helping and worrying, like [name], that it's turned into enabling.  Therapeutic modalities: Cognitive Behavioral Therapy, Solution-Oriented/Positive Psychology and Assertiveness/Communication  Mental Status/Observations:  Appearance:   Casual     Behavior:  Appropriate  Motor:  Normal  Speech/Language:   Clear and Coherent  Affect:  Appropriate  Mood:  anxious and sad  Thought process:  normal  Thought content:    WNL  Sensory/Perceptual disturbances:    WNL  Orientation:  Fully oriented  Attention:  Good    Concentration:  Good  Memory:  WNL  Insight:    Good  Judgment:   Good  Impulse Control:  Good   Risk Assessment: Danger to Self: No Self-injurious Behavior: No Danger to Others: No Physical Aggression / Violence: No Duty to Warn: No Access to Firearms a concern: No  Assessment of progress:  stabilized  Diagnosis:   ICD-10-CM   1. Generalized anxiety disorder  F41.1   2. Bipolar I disorder  (HCC)  F31.9   3. Caregiver stress  Z63.6   4. Relationship problem between partners  Z63.0   5. History of posttraumatic stress disorder (PTSD)  Z86.59    Plan:  . Imagine and rehearse constructive confrontation with [name].  Let [name] know, include as able. . Other recommendations/advice as may be noted above . Continue to utilize previously learned skills ad lib . Maintain medication as prescribed and work faithfully with relevant prescriber(s) if any changes are desired or seem indicated . Call the clinic on-call service, present to ER, or call 911 if any life-threatening psychiatric crisis Return in about 2 weeks (around 09/25/2020) for time as available. . Already scheduled visit in this office 09/28/2020.  [name], PhD [name], PhD LP Clinical Psychologist, [facility] [address] [place], [place] [address] [phone]

## 2020-09-28 ENCOUNTER — Other Ambulatory Visit: Payer: Self-pay

## 2020-09-28 ENCOUNTER — Ambulatory Visit (INDEPENDENT_AMBULATORY_CARE_PROVIDER_SITE_OTHER): Payer: BC Managed Care – PPO | Admitting: Psychiatry

## 2020-09-28 DIAGNOSIS — Z630 Problems in relationship with spouse or partner: Secondary | ICD-10-CM

## 2020-09-28 DIAGNOSIS — F411 Generalized anxiety disorder: Secondary | ICD-10-CM

## 2020-09-28 DIAGNOSIS — F1210 Cannabis abuse, uncomplicated: Secondary | ICD-10-CM

## 2020-09-28 DIAGNOSIS — Z8659 Personal history of other mental and behavioral disorders: Secondary | ICD-10-CM

## 2020-09-28 DIAGNOSIS — F3131 Bipolar disorder, current episode depressed, mild: Secondary | ICD-10-CM

## 2020-09-28 DIAGNOSIS — Z636 Dependent relative needing care at home: Secondary | ICD-10-CM

## 2020-09-28 NOTE — Progress Notes (Signed)
Psychotherapy Progress Note [facility] 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    Re [name], sold a bunch of his things for him to help him liquidate things to make ends meet.  Not yet with the supportive confrontation about being unrealistically hooked on his ex, but may this Sunday.  Not furious with him, but still weary of the helpless feeling watching him be more or less inert and unrealistic.  Reviewed points of supportive confrontation.  [name] in a more delicate place right now.  She's been in a hormonal fluctuation with what seems to be the petering out of her birth control method.  Frit feels it's more risky emotionally to approach her about anything, including Brian's situation, but she is less provoked by it.    Has cut back drinking, after seeing what happened.  Pot smoking increased again, anticipates switching to edibles again to trim it back.  Discussed behavioral alternatives for smoking urges -- reading, cleaning/organizing his toy supply, movement.   Therapeutic modalities: Cognitive Behavioral Therapy, Solution-Oriented/Positive Psychology, Ego-Supportive and Humanistic/Existential  Mental Status/Observations:  Appearance:   Casual     Behavior:  Appropriate  Motor:  Normal  Speech/Language:   Clear  and Coherent  Affect:  Appropriate  Mood:  anxious  Thought process:  normal  Thought content:    WNL  Sensory/Perceptual disturbances:    WNL  Orientation:  Fully oriented  Attention:  Good    Concentration:  Good  Memory:  WNL  Insight:    Good  Judgment:   Good  Impulse Control:  Good   Risk Assessment: Danger to Self: No Self-injurious Behavior: No Danger to Others: No Physical Aggression / Violence: No Duty to Warn: No Access to Firearms a concern: No  Assessment of progress:  progressing  Diagnosis:   ICD-10-CM   1. Bipolar affective disorder, currently depressed, mild (HCC)  F31.31   2. Caregiver stress  Z63.6   3. Relationship problem between partners  Z63.0   4. Generalized anxiety disorder  F41.1   5. History of posttraumatic stress disorder (PTSD)  Z86.59   6. Tetrahydrocannabinol (THC) use disorder, mild, abuse  F12.10    Plan:  . Continue preparing for supportive confrontation with [name] . Consider engaging [name] further about wishes at home . Continue pursuing art work including avenues that Herbalist and motivation -- use earlier tips if desired . Exercise alternatives to smoking before deciding whether to indulge.  Edible THC more controllable, endorse self-titration. . Other recommendations/advice as may be noted above . Continue to utilize previously learned skills ad lib . Maintain medication as prescribed and work faithfully with relevant prescriber(s) if any changes are desired or seem indicated . Call the clinic on-call service, present to ER, or call 911 if any life-threatening psychiatric crisis Return  in about 2 weeks (around 10/12/2020) for time as available. . Already scheduled visit in this office 10/08/2020.  [name], PhD [name], PhD LP Clinical Psychologist, [facility] [address], [address] [place], [place] [address] [phone]

## 2020-10-08 ENCOUNTER — Ambulatory Visit (INDEPENDENT_AMBULATORY_CARE_PROVIDER_SITE_OTHER): Payer: BC Managed Care – PPO | Admitting: Physician Assistant

## 2020-10-08 ENCOUNTER — Other Ambulatory Visit: Payer: Self-pay

## 2020-10-08 ENCOUNTER — Encounter: Payer: Self-pay | Admitting: Physician Assistant

## 2020-10-08 DIAGNOSIS — F319 Bipolar disorder, unspecified: Secondary | ICD-10-CM | POA: Diagnosis not present

## 2020-10-08 DIAGNOSIS — F5105 Insomnia due to other mental disorder: Secondary | ICD-10-CM

## 2020-10-08 DIAGNOSIS — F4310 Post-traumatic stress disorder, unspecified: Secondary | ICD-10-CM

## 2020-10-08 DIAGNOSIS — F411 Generalized anxiety disorder: Secondary | ICD-10-CM

## 2020-10-08 DIAGNOSIS — F99 Mental disorder, not otherwise specified: Secondary | ICD-10-CM

## 2020-10-08 NOTE — Progress Notes (Signed)
[facility]  Patient ID: Tristan [name],  MRN: [id number]  PCP: Patient, No Pcp Per  Date of Evaluation: 10/08/2020 Time spent:30 minutes  Chief Complaint:  Chief Complaint    Anxiety; Depression; Insomnia; Follow-up      HISTORY/CURRENT STATUS: HPI for routine 6 week med check.  Still having trouble with sleep. Sonata helps him go to sleep, but still waking up at least 5 nights a week and the majority of the time he is unable to go back to sleep.  He is hesitant to take a second dose in the middle of the night because he would end up having to get up, go to the kitchen which would probably make him more alert and be unable to go back to sleep due to that.  He is able to enjoy things.  Energy and motivation are good except for fatigue associated with insomnia.  Not isolating.  Appetite is normal.  No suicidal or homicidal thoughts.  Anxiety is well controlled.  Of course if there is a trigger, he can get anxious, but usually he has a generalized sense of unease, not panic attacks.  The BuSpar and Valium are very helpful.  Patient denies increased energy with decreased need for sleep, no increased talkativeness, no racing thoughts, no impulsivity or risky behaviors, no increased spending, no increased libido, no grandiosity, no increased irritability or anger, no paranoia, and no hallucinations.  Denies dizziness, syncope, seizures, numbness, tingling, tremor, tics, unsteady gait, slurred speech, confusion. Denies muscle or joint pain, stiffness, or dystonia.   Individual Medical History/ Review of Systems: Changes? :No    Past medications for mental health diagnoses include: Celexa, Lamictal, Lexapro, Seroquel, prazosin, Trileptal, trazodone, Vistaril, Sonata.   Allergies: Patient has no known allergies.  Current Medications:  Current Outpatient Medications:  .  busPIRone (BUSPAR) 15 MG tablet, TAKE 3 TABLETS IN THE MORNING AND 2 TABLETS AT BEDTIME, Disp: 450  tablet, Rfl: 1 .  diazepam (VALIUM) 5 MG tablet, Take 1 tablet (5 mg total) by mouth every 8 (eight) hours as needed for anxiety., Disp: 90 tablet, Rfl: 0 .  hydrOXYzine (ATARAX/VISTARIL) 25 MG tablet, Take 1-2 tablets (25-50 mg total) by mouth every 6 (six) hours as needed for anxiety., Disp: 240 tablet, Rfl: 5 .  lamoTRIgine (LAMICTAL) 150 MG tablet, TAKE 1 TABLET BY MOUTH TWICE A DAY, Disp: 180 tablet, Rfl: 3 .  oxcarbazepine (TRILEPTAL) 600 MG tablet, TAKE 1 TABLET BY MOUTH TWICE A DAY, Disp: 180 tablet, Rfl: 1 .  zaleplon (SONATA) 10 MG capsule, One po qhs prn sleep and may repeat 1 for MNA if has 3 hours left to sleep prn., Disp: 60 capsule, Rfl: 1 Medication Side Effects: none  Family Medical/ Social History: Changes? no  MENTAL HEALTH EXAM:  There were no vitals taken for this visit.There is no height or weight on file to calculate BMI.  General Appearance: Casual, Neat and Well Groomed  Eye Contact:  Good  Speech:  Clear and Coherent and Normal Rate  Volume:  Normal  Mood:  Euthymic  Affect:  Appropriate  Thought Process:  Goal Directed and Descriptions of Associations: Intact  Orientation:  Full (Time, Place, and Person)  Thought Content: Logical   Suicidal Thoughts:  No  Homicidal Thoughts:  No  Memory:  WNL  Judgement:  Good  Insight:  Good  Psychomotor Activity:  Normal  Concentration:  Concentration: Good  Recall:  Good  Fund of Knowledge: Good  Language: Good  Assets:  Desire for Improvement  ADL's:  Intact  Cognition: WNL  Prognosis:  Good    DIAGNOSES:    ICD-10-CM   1. Insomnia due to other mental disorder  F51.05    F99   2. Bipolar I disorder (HCC)  F31.9   3. PTSD (post-traumatic stress disorder)  F43.10   4. Generalized anxiety disorder  F41.1     Receiving Psychotherapy: Yes Dr. Mardelle Matte Mitchum   RECOMMENDATIONS:  PDMP was reviewed. Sonata on 08/29/2020. I provided 30 minutes of face-to-face time during this encounter, discussing the insomnia,  sleep hygiene, different treatment options.  I recommend that he has the Sonata in his bedroom or bathroom and take 1 pill in the middle of the night if he does wake up.  There are times when he will only stay awake 10 to 15 minutes but he never knows when that will occur versus staying awake for several hours or even the rest of the night.  We will try that approach first but then go to Zolpimist if Sonata is ineffective.  Think that we will work quicker and also keep him asleep better.  If he prefers, or if ZolpiMist is cost prohibitive, we could do oral Ambien or Lunesta.  He will call prior to the next visit if he continues to have problems.   As far as his moods go, he is doing well so no medication changes are necessary. Continue BuSpar 15 mg, 3 p.o. every morning and 2 p.o. nightly. Continue Valium 5 mg, 1 p.o. nightly 8H as needed anxiety. Continue hydroxyzine 25 mg, 1-2 p.o. every 6 hours as needed anxiety. Continue Lamictal 150 mg, 1 p.o. twice daily  continue Trileptal 600 mg, 1 p.o. twice daily. Continue Sonata 10 mg, 1 p.o. nightly as needed sleep and may repeat 1 for mid nocturnal awakening as long as he has 3 hours left to sleep. Continue therapy with Dr. [name]. Return in 6 weeks.  Melony Overly, PA-C

## 2020-10-12 ENCOUNTER — Other Ambulatory Visit: Payer: Self-pay

## 2020-10-12 ENCOUNTER — Ambulatory Visit (INDEPENDENT_AMBULATORY_CARE_PROVIDER_SITE_OTHER): Payer: BC Managed Care – PPO | Admitting: Psychiatry

## 2020-10-12 DIAGNOSIS — Z8659 Personal history of other mental and behavioral disorders: Secondary | ICD-10-CM | POA: Diagnosis not present

## 2020-10-12 DIAGNOSIS — Z630 Problems in relationship with spouse or partner: Secondary | ICD-10-CM | POA: Diagnosis not present

## 2020-10-12 DIAGNOSIS — F411 Generalized anxiety disorder: Secondary | ICD-10-CM

## 2020-10-12 DIAGNOSIS — G4700 Insomnia, unspecified: Secondary | ICD-10-CM

## 2020-10-12 DIAGNOSIS — Z636 Dependent relative needing care at home: Secondary | ICD-10-CM

## 2020-10-12 NOTE — Progress Notes (Signed)
Psychotherapy Progress Note [facility] Tristan Czar, PhD LP  Patient ID: Tristan [name]     MRN: [id number] Therapy format: Individual psychotherapy Date: 10/12/2020      Start: 8:09a     Stop: 8:48a     Time Spent: 44 min Location: In-person   Session narrative (presenting needs, interim history, self-report of stressors and symptoms, applications of prior therapy, status changes, and interventions made in session) Been on "coast" last couple weeks.  Sees household persistently tense, overshadowed with Tristan [name]'s shifting negative moods, work stress, discontent, post-vaccination hormonal issues, turn to vegetarian diet (on discovering that meat of all kinds began causing her painful gas reactions), and restless feelings to get extraordinary variety in food.  Generally intimidated by the tension of it, has been tentative for a while.  Personal hx of a previous live-in GF vacating the house.  Tristan [name] turning distant, cold, angry, seems to forebode her re-enacting her mother's bearish behavior with father.  Sleep recently improved with new bed frame, eliminating squeak and sag, so hope of more restful sleep for both of them, but a lot of free time spent solitary trying to not rock the boat emotionally.    Advised be willing to perception-check with Tristan [name] whether dread moments are meant hostile or not, resolve to forgive harsh comments as expressions of bodily discontent, and offer her choice whether to have him closer or more distant, reasoning that she is reacting to feeling powerless with her own body and being given some choice helps soothe that.  Moreover, sounds like like she could use some soothing touch, if she will allow it, especially in light of her history of emotional invalidation, abiding shame issues, relatively recent history of conflictual pregnancy and abortion, tendency to overwork, and tendency to soothe herself with video and phone games now.  On strong hunch she  could use physical comfort, encouraged he reach out and offer massage, back scratch, foot rub, arm around shoulders, anything she could legitimately open up and relax into, both as beyond-words soothing and as a safe signal to her own subsconscious concern that she may have alienated him by being so forbidding, so "emotional", etc.  Realized she could really like flowers, too, so resolved to bring those home today.  Suggested Tristan [name]'s systemic problems may involve gut microbial imbalance, actually, and that a probiotic strategy may help her regulate further.  Credible given other reports that the COVID vaccination has thrown other women into unexpected cycles that it is also happening with Tristan [name], but hope that intervening with digestion could also help that.    Therapeutic modalities: Cognitive Behavioral Therapy, Solution-Oriented/Positive Psychology and Psycho-education/Bibliotherapy  Mental Status/Observations:  Appearance:   Casual     Behavior:  Appropriate  Motor:  Normal  Speech/Language:   Clear and Coherent  Affect:  Appropriate  Mood:  anxious and dysthymic  Thought process:  normal  Thought content:    WNL  Sensory/Perceptual disturbances:    WNL  Orientation:  Fully oriented  Attention:  Good    Concentration:  Good  Memory:  WNL  Insight:    Good  Judgment:   Good  Impulse Control:  Good   Risk Assessment: Danger to Self: No Self-injurious Behavior: No Danger to Others: No Physical Aggression / Violence: No Duty to Warn: No Access to Firearms a concern: No  Assessment of progress:  progressing  Diagnosis:   ICD-10-CM   1. Generalized anxiety disorder  F41.1   2. Caregiver stress  Z63.6   3. Relationship problem between partners  Z63.0   4. History of posttraumatic stress disorder (PTSD)  Z86.59   5. Insomnia, unspecified type  G47.00    improved with bedding   Plan:  Marland Kitchen Offers to [employer] -- flowers, scratch or other touch, choice of more together or more alone  time, gift of opportunity to do things differently this weekend, whatever might feel better and break the cycle of just withstanding and distracting from ill feelings . Still recommend perception checks over assumptions and getting distance, to prevent unintended messages of rejection . As welcome, offer possibility of probiotic help for her digestive and hormonal issues . Other recommendations/advice as may be noted above . Continue to utilize previously learned skills ad lib . Maintain medication as prescribed and work faithfully with relevant prescriber(s) if any changes are desired or seem indicated . Call the clinic on-call service, present to ER, or call 911 if any life-threatening psychiatric crisis Return for session(s) already scheduled. . Already scheduled visit in this office 10/26/2020.  Tristan Fries, PhD Tristan Czar, PhD LP Clinical Psychologist, [facility] [address] [place], [place] [address] [phone]

## 2020-10-26 ENCOUNTER — Ambulatory Visit (INDEPENDENT_AMBULATORY_CARE_PROVIDER_SITE_OTHER): Payer: BC Managed Care – PPO | Admitting: Psychiatry

## 2020-10-26 ENCOUNTER — Other Ambulatory Visit: Payer: Self-pay

## 2020-10-26 DIAGNOSIS — Z630 Problems in relationship with spouse or partner: Secondary | ICD-10-CM

## 2020-10-26 DIAGNOSIS — F319 Bipolar disorder, unspecified: Secondary | ICD-10-CM | POA: Diagnosis not present

## 2020-10-26 DIAGNOSIS — F411 Generalized anxiety disorder: Secondary | ICD-10-CM

## 2020-10-26 DIAGNOSIS — F1210 Cannabis abuse, uncomplicated: Secondary | ICD-10-CM

## 2020-10-26 DIAGNOSIS — Z8659 Personal history of other mental and behavioral disorders: Secondary | ICD-10-CM

## 2020-10-26 NOTE — Progress Notes (Signed)
Psychotherapy Progress Note [facility] Tristan Czar, PhD LP  Patient ID: Tristan [name]     MRN: [id number] Therapy format: Individual psychotherapy Date: 10/26/2020      Start: 9:15a     Stop: 10:05a     Time Spent: 50 min Location: In-person   Session narrative (presenting needs, interim history, self-report of stressors and symptoms, applications of prior therapy, status changes, and interventions made in session) Trying to recognize progress but fears he may be becoming complacent.  One thing this morning with a fellow patient who banged her car door into his and pushed past him into the elevator and not yelling at her, just asking her to not bang the door.  At home, has found himself able to meet a mistake in art work without catastrophizing, which Tristan [name] astutely noticed.  Credit to medication, conscious choice to apply a possibilities attitude, habit-building to do a little something even if big wishes seem impossible, do a little something to make a little momentum, and count progress rather than dwell on stagnation.  With [name], still tensions, but a revelation this week that she got asked when she was due, when she only has a belly bulge.  Positive plans to start yoga together.  Watching a favorite show together.  Knows Tristan [name] is trying to address her own irritability by engaging some meditation/devotions.  Food choices remain stressful for Tristan [name] knowing a lot more what she doesn't want than what she does, but [name] deliveries are helping cut down the dilemmas.  Tristan [name] cooking 3-5 days, and stable enough with weight.  Starting to walk more as weather warms.  As issues go, there may be a conflict to be had about plants coming in the house -- Tristan [name] has a tendency to bring in enough to clutter up space, and her tendency to go to extremes, obligate herself into aside job (e.g., 2 hours of watering at the height of it) then neglect things.  4-6 weeks ago "decreed" no more  plants; recognizes the outburst was set up by having bottled up some before it, would use a "do-over" to warm up the subject smaller.  Encouraged to try it out, practice smaller statements of "I'm concerned" or "I'd rather" to better show her that he isn't a volcano and better show himself it's not catastrophic to object.  The flower idea worked out pretty well.  Not immediately, as Tristan [name] was preoccupied with work stress, but commented appreciation a couple days later.  Agreed it's not too hackneyed to be helpful.  Affirmed and encouraged. Still would like to get back to the roots of his discontent.    Therapeutic modalities: Cognitive Behavioral Therapy and Solution-Oriented/Positive Psychology  Mental Status/Observations:  Appearance:   Casual     Behavior:  Appropriate  Motor:  Normal  Speech/Language:   Clear and Coherent  Affect:  Appropriate  Mood:  dysthymic, more responsive  Thought process:  normal  Thought content:    WNL  Sensory/Perceptual disturbances:    WNL  Orientation:  Fully oriented  Attention:  Good    Concentration:  Good  Memory:  WNL  Insight:    Good  Judgment:   Good  Impulse Control:  Good   Risk Assessment: Danger to Self: No Self-injurious Behavior: No Danger to Others: No Physical Aggression / Violence: No Duty to Warn: No Access to Firearms a concern: No  Assessment of progress:  progressing  Diagnosis:   ICD-10-CM   1. Generalized anxiety  disorder  F41.1   2. Relationship problem between partners  Z63.0   3. History of posttraumatic stress disorder (PTSD)  Z86.59   4. Bipolar I disorder (HCC)  F31.9   5. Tetrahydrocannabinol (THC) use disorder, mild, abuse  F12.10    Plan:  . Follow through opportunities for quality time . Continue adjusted plans for food prep and time together . Seek opportunities to voice small objections and preferences when they're small, to detoxify the prospect of having differences . Continue prior efforts to  maintain sleep, circadian rhythm, and restrained use or abstinence with THC . Other recommendations/advice as may be noted above . Continue to utilize previously learned skills ad lib . Maintain medication as prescribed and work faithfully with relevant prescriber(s) if any changes are desired or seem indicated . Call the clinic on-call service, present to ER, or call 911 if any life-threatening psychiatric crisis Return for session(s) already scheduled. . Already scheduled visit in this office 11/09/2020.  Tristan Fries, PhD Tristan Czar, PhD LP Clinical Psychologist, [facility] [address] [place], [place] [address] [phone]

## 2020-10-28 ENCOUNTER — Encounter (HOSPITAL_COMMUNITY): Payer: Self-pay | Admitting: Emergency Medicine

## 2020-10-28 ENCOUNTER — Emergency Department (HOSPITAL_COMMUNITY): Payer: BC Managed Care – PPO

## 2020-10-28 ENCOUNTER — Other Ambulatory Visit: Payer: Self-pay

## 2020-10-28 ENCOUNTER — Emergency Department (HOSPITAL_COMMUNITY)
Admission: EM | Admit: 2020-10-28 | Discharge: 2020-10-28 | Disposition: A | Discharge status: Home / Self Care | Payer: BC Managed Care – PPO | Attending: Emergency Medicine | Admitting: Emergency Medicine

## 2020-10-28 DIAGNOSIS — R0789 Other chest pain: Secondary | ICD-10-CM | POA: Insufficient documentation

## 2020-10-28 LAB — BASIC METABOLIC PANEL
Anion gap: 9 (ref 5–15)
BUN: 20 mg/dL (ref 6–20)
CO2: 30 mmol/L (ref 22–32)
Calcium: 9.3 mg/dL (ref 8.9–10.3)
Chloride: 100 mmol/L (ref 98–111)
Creatinine, Ser: 1 mg/dL (ref 0.61–1.24)
GFR, Estimated: >60 mL/min (ref >60)
Glucose, Bld: 100 mg/dL — ABNORMAL HIGH (ref 70–99)
Potassium: 4.4 mmol/L (ref 3.5–5.1)
Sodium: 139 mmol/L (ref 135–145)

## 2020-10-28 LAB — CBC
HCT: 43.4 % (ref 39.0–52.0)
Hemoglobin: 14.7 g/dL (ref 13.0–17.0)
MCH: 30.1 pg (ref 26.0–34.0)
MCHC: 33.9 g/dL (ref 30.0–36.0)
MCV: 88.8 fL (ref 80.0–100.0)
Platelets: 307 10*3/uL (ref 150–400)
RBC: 4.89 MIL/uL (ref 4.22–5.81)
RDW: 12.9 % (ref 11.5–15.5)
WBC: 10.7 10*3/uL — ABNORMAL HIGH (ref 4.0–10.5)
nRBC: 0 % (ref 0.0–0.2)

## 2020-10-28 LAB — TROPONIN I (HIGH SENSITIVITY)
Troponin I (High Sensitivity): 4 ng/L (ref <18)
Troponin I (High Sensitivity): 3 ng/L (ref <18)

## 2020-10-28 LAB — D-DIMER, QUANTITATIVE: D-Dimer, Quant: 0.34 ug/mL-FEU (ref 0.00–0.50)

## 2020-10-28 MED ORDER — KETOROLAC TROMETHAMINE 30 MG/ML IJ SOLN
30.0000 mg | Freq: Once | INTRAMUSCULAR | Status: AC
  Administered 2020-10-28: 30 mg via INTRAVENOUS
  Filled 2020-10-28: qty 1

## 2020-10-28 NOTE — Discharge Instructions (Addendum)
Apply ice or heat as needed.  Take ibuprofen or naproxen as needed for pain.  If you still need additional relief of pain, add acetaminophen.  Return if symptoms are getting worse.

## 2020-10-28 NOTE — ED Provider Notes (Signed)
[facility]-EMERGENCY DEPT Provider Note   CSN: [id number] Arrival date & time: 10/28/20  0050   History Chief Complaint  Patient presents with  . Chest Pain    Tristan [name] is a 50 y.o. male.  The history is provided by the patient.  Chest Pain He has history of bipolar disorder, anxiety and comes in complaining of chest pain.  He describes a pressure feeling in the left side of the chest which does radiate to the interscapular area.  Pain was initially intermittent, but has been constant for at least the last 36 hours.  Initially, he noticed that pain was present when he was at rest, seem to get better if he was active.  It is worse when he lays flat and worse when he bends over, but nothing seems to make it any better.  He also notes that it is worse if he takes a deep breath.  He did take aspirin 650 mg today and it did seem to ease his pain off somewhat.  There is no associated dyspnea, nausea, diaphoresis.  He has not had anything like this before.  He does not smoke cigarettes and denies history of hypertension or diabetes or hyperlipidemia.  There is a fairly strong family history of premature coronary atherosclerosis.  He denies any recent surgery or travel there is no prior history of DVT or PE.  Past Medical History:  Diagnosis Date  . Allergy   . Anxiety   . Bipolar 2 disorder (HCC)   . Depression     Patient Active Problem List   Diagnosis Date Noted  . GAD (generalized anxiety disorder) 06/08/2018  . PTSD (post-traumatic stress disorder) 06/08/2018  . Insomnia 06/08/2018  . Night terrors 06/08/2018  . Tetrahydrocannabinol (THC) use disorder, mild, abuse 06/08/2018  . DEHYDRATION 04/13/2008  . BIPOLAR DISORDER UNSPECIFIED 04/13/2008  . GASTROENTERITIS 04/13/2008  . DEPRESSION 04/05/2007    Past Surgical History:  Procedure Laterality Date  . HERNIA REPAIR    . TEMPOROMANDIBULAR JOINT SURGERY    . Undescended testicle          Family History  Problem Relation Age of Onset  . Diabetes Father   . Stroke Father   . Heart disease Father   . Hypertension Father     Social History   Tobacco Use  . Smoking status: Never Smoker  . Smokeless tobacco: Never Used  Substance Use Topics  . Alcohol use: Yes    Alcohol/week: 2.0 standard drinks    Types: 2 Shots of liquor per week  . Drug use: Yes    Types: Marijuana    Comment: gummies    Home Medications Prior to Admission medications   Medication Sig Start Date End Date Taking? Authorizing Provider  busPIRone (BUSPAR) 15 MG tablet TAKE 3 TABLETS IN THE MORNING AND 2 TABLETS AT BEDTIME 06/06/20   Hurst, Rosey Bath T, PA-C  diazepam (VALIUM) 5 MG tablet Take 1 tablet (5 mg total) by mouth every 8 (eight) hours as needed for anxiety. 05/29/20   Melony Overly T, PA-C  hydrOXYzine (ATARAX/VISTARIL) 25 MG tablet Take 1-2 tablets (25-50 mg total) by mouth every 6 (six) hours as needed for anxiety. 08/22/20   Melony Overly T, PA-C  lamoTRIgine (LAMICTAL) 150 MG tablet TAKE 1 TABLET BY MOUTH TWICE A DAY 09/10/20   Hurst, Rosey Bath T, PA-C  oxcarbazepine (TRILEPTAL) 600 MG tablet TAKE 1 TABLET BY MOUTH TWICE A DAY 05/29/20   Melony Overly T, PA-C  zaleplon (  SONATA) 10 MG capsule One po qhs prn sleep and may repeat 1 for MNA if has 3 hours left to sleep prn. 08/22/20   Cherie Ouch, PA-C    Allergies    Patient has no known allergies.  Review of Systems   Review of Systems  Cardiovascular: Positive for chest pain.  All other systems reviewed and are negative.   Physical Exam Updated Vital Signs BP (!) 129/91 (BP Location: Right Arm)   Pulse 77   Temp 98 F (36.7 C) (Oral)   Resp 16   SpO2 100%   Physical Exam Vitals and nursing note reviewed.   50 year old male, resting comfortably and in no acute distress. Vital signs are significant for borderline elevated blood pressure. Oxygen saturation is 100%, which is normal. Head is normocephalic and atraumatic.  PERRLA, EOMI. Oropharynx is clear. Neck is nontender and supple without adenopathy or JVD. Back is nontender and there is no CVA tenderness. Lungs are clear without rales, wheezes, or rhonchi. Chest is mildly tender in the left parasternal area. Heart has regular rate and rhythm without murmur.  No rubs are heard. Abdomen is soft, flat, nontender without masses or hepatosplenomegaly and peristalsis is normoactive. Extremities have no cyanosis or edema, full range of motion is present. Skin is warm and dry without rash. Neurologic: Mental status is normal, cranial nerves are intact, there are no motor or sensory deficits.  ED Results / Procedures / Treatments   Labs (all labs ordered are listed, but only abnormal results are displayed) Labs Reviewed  BASIC METABOLIC PANEL  CBC  TROPONIN I (HIGH SENSITIVITY)    EKG EKG Interpretation  Date/Time:  Sunday October 28 2020 01:05:15 EST Ventricular Rate:  73 PR Interval:    QRS Duration: 104 QT Interval:  374 QTC Calculation: 413 R Axis:   79 Text Interpretation: Sinus rhythm Abnormal R-wave progression, early transition No old tracing to compare Confirmed by Dione Booze ([id number]) on 10/28/2020 1:19:37 AM   Radiology DG Chest 2 View  Result Date: 10/28/2020 CLINICAL DATA:  Generalized chest pain for 3 days EXAM: CHEST - 2 VIEW COMPARISON:  None. FINDINGS: The heart size and mediastinal contours are within normal limits. Both lungs are clear. The visualized skeletal structures are unremarkable. IMPRESSION: No active cardiopulmonary disease. Electronically Signed   By: Sharlet Salina M.D.   On: 10/28/2020 01:37    Procedures Procedures   Medications Ordered in ED Medications - No data to display  ED Course  I have reviewed the triage vital signs and the nursing notes.  Pertinent labs & imaging results that were available during my care of the patient were reviewed by me and considered in my medical decision making (see chart for  details).  MDM Rules/Calculators/A&P Chest pain of uncertain cause.  Pattern does not seem typical for ACS.  Positional nature of pain and pleuritic nature could be consistent with pericarditis, but no findings to suggest pericarditis on ECG and no rub was heard.  Chest wall tenderness would be consistent with costochondritis.  No risk factors for pulmonary embolism.  ECG shows no acute changes and chest x-ray is normal.  Will check screening labs including troponin and D-dimer, give therapeutic trial of ketorolac.  Old records are reviewed, and he has no similar prior visits.  He does have numerous visits with behavioral health related to generalized anxiety disorder.  5:23 AM Initial troponin is normal as is D-dimer.  He has had significant relief with ketorolac, consistent with  costochondritis.  Repeat troponin is pending.  Repeat troponin is normal.  He is discharged with instructions to use ice or heat as needed, take over-the-counter NSAIDs as needed.  Return precautions discussed.  Final Clinical Impression(s) / ED Diagnoses Final diagnoses:  Chest wall pain    Rx / DC Orders ED Discharge Orders    None       Dione Booze, MD 10/28/20 [phone]

## 2020-10-28 NOTE — ED Triage Notes (Signed)
Patient arrives from home complaining of chest pain that began Thursday. Patient with heavy history of anxiety and tensions at home. Denies nausea, vomiting or other symptoms.

## 2020-11-09 ENCOUNTER — Ambulatory Visit (INDEPENDENT_AMBULATORY_CARE_PROVIDER_SITE_OTHER): Payer: BC Managed Care – PPO | Admitting: Psychiatry

## 2020-11-09 ENCOUNTER — Other Ambulatory Visit: Payer: Self-pay

## 2020-11-09 DIAGNOSIS — Z630 Problems in relationship with spouse or partner: Secondary | ICD-10-CM | POA: Diagnosis not present

## 2020-11-09 DIAGNOSIS — F411 Generalized anxiety disorder: Secondary | ICD-10-CM | POA: Diagnosis not present

## 2020-11-09 DIAGNOSIS — F319 Bipolar disorder, unspecified: Secondary | ICD-10-CM | POA: Diagnosis not present

## 2020-11-09 DIAGNOSIS — Z8659 Personal history of other mental and behavioral disorders: Secondary | ICD-10-CM
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
## 2020-11-23 ENCOUNTER — Ambulatory Visit (INDEPENDENT_AMBULATORY_CARE_PROVIDER_SITE_OTHER): Payer: BC Managed Care – PPO | Admitting: Psychiatry

## 2020-11-23 ENCOUNTER — Other Ambulatory Visit: Payer: Self-pay

## 2020-11-23 DIAGNOSIS — F319 Bipolar disorder, unspecified: Secondary | ICD-10-CM

## 2020-11-23 DIAGNOSIS — F411 Generalized anxiety disorder: Secondary | ICD-10-CM | POA: Diagnosis not present

## 2020-11-23 DIAGNOSIS — F1210 Cannabis abuse, uncomplicated: Secondary | ICD-10-CM

## 2020-11-23 DIAGNOSIS — F458 Other somatoform disorders: Secondary | ICD-10-CM | POA: Diagnosis not present

## 2020-11-23 DIAGNOSIS — Z8659 Personal history of other mental and behavioral disorders: Secondary | ICD-10-CM

## 2020-11-23 DIAGNOSIS — Z9189 Other specified personal risk factors, not elsewhere classified: Secondary | ICD-10-CM

## 2020-11-23 NOTE — Progress Notes (Signed)
Psychotherapy Progress Note [facility] [name], PhD LP  Patient ID: Tristan [name]     MRN: [id number] Therapy format: Individual psychotherapy Date: 11/23/2020      Start: 8:14a     Stop: 9:0a     Time Spent: 50 min Location: In-person   Session narrative (presenting needs, interim history, self-report of stressors and symptoms, applications of prior therapy, status changes, and interventions made in session) Wants to prepare for visit with [name] today, who is starting to come out of his house.  Wants to set an agreement today to be a non-dwelling day.  Discussed and troubleshot how that might go, getting and keeping that agreement.   Reveals having reached a point of needing extensive dental work, and regret how he got there -- lack of example for oral hygiene, parents already had dentures young, lot of sugar, no training, bad dental experience, poorly kept braces, and really left to his own devices.  Anxiety young began an old habit of teeth grinding, both awake and asleep.  Has lost some teeth at this point, estimates 8 broken, no pain.  Has been self-conscious about it, though used to the feel.  Has had people recoil a bit meeting him, noticed 4-5 years ago, plus it inhibits intimacy with [name], who has respectfully not said much and is aware of his new motivation to treat.  Not clear quite what needs doing, but seems to present likely choice of multiple crowns vs. implants, partial, or even full dentures.  Denies pain such as would indicate significant infection, but advised it is yet possible.  Aware of his own tendency to want to smoke and block it out, as well as the value, perhaps, of taking care of all oral health habits together -- bruxism, smoking, cleaning and repairs so as not to waste the opportunity to know better feel, function, and freedom from depressing self-consciousness about it.  Validated normal reasons to develop abnormal oral health, motivation to  improve, and reviewed difficult service and customer service experiences.  Offered referral on personal knowledge -- turns out to be son of the dentist who killed a nerve when he was a child -- and discussed measures to locate and prepare next dentist for his difficulties (introduce self as a patient with history and need to overcome negative experiences, prepare synopsis of what he knows is wrong already and what led to those conditions, and if needed request explicitly a preliminary paid "interview" visit without assuming responsibility and contract to treat).  Therapeutic modalities: Cognitive Behavioral Therapy, Solution-Oriented/Positive Psychology, Ego-Supportive and Assertiveness/Communication  Mental Status/Observations:  Appearance:   Casual     Behavior:  Appropriate  Motor:  Normal  Speech/Language:   Clear and Coherent  Affect:  Appropriate  Mood:  anxious  Thought process:  normal  Thought content:    WNL  Sensory/Perceptual disturbances:    WNL  Orientation:  Fully oriented  Attention:  Good    Concentration:  Good  Memory:  WNL  Insight:    Good  Judgment:   Good  Impulse Control:  Good   Risk Assessment: Danger to Self: No Self-injurious Behavior: No Danger to Others: No Physical Aggression / Violence: No Duty to Warn: No Access to Firearms a concern: No  Assessment of progress:  progressing  Diagnosis:   ICD-10-CM   1. Generalized anxiety disorder  F41.1   2. Bipolar I disorder (HCC)  F31.9   3. History of posttraumatic stress disorder (PTSD)  Z86.59   4. 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 At risk for dental problems  Z91.89   6. Tetrahydrocannabinol (THC) use disorder, mild, abuse  F12.10    Plan:  . Try explicit agreement with [name] to segregate time on his problems today . Engage new dentist soon as able, prepare dental health synopsis, request "preview" visit if so moved . Continue efforts to restrain sugar, reduce bruxism, self-soothe anxiety in service of dental  health . Continue efforts to communicate more openly and assertively with [facility] . Continue efforts to abstain from [facility] . Other recommendations/advice as may be noted above . Continue to utilize previously learned skills ad lib . Maintain medication as prescribed and work faithfully with relevant prescriber(s) if any changes are desired or seem indicated . Call the clinic on-call service, present to ER, or call 911 if any life-threatening psychiatric crisis Return for session(s) already scheduled. . Already scheduled visit in this office 11/29/2020.  [name], PhD [name], PhD LP Clinical Psychologist, [facility] [address], [address] [place], [place] [address] [phone]

## 2020-11-29 ENCOUNTER — Encounter: Payer: Self-pay | Admitting: Physician Assistant

## 2020-11-29 ENCOUNTER — Ambulatory Visit (INDEPENDENT_AMBULATORY_CARE_PROVIDER_SITE_OTHER): Payer: BC Managed Care – PPO | Admitting: Physician Assistant

## 2020-11-29 ENCOUNTER — Other Ambulatory Visit: Payer: Self-pay

## 2020-11-29 DIAGNOSIS — F411 Generalized anxiety disorder: Secondary | ICD-10-CM

## 2020-11-29 DIAGNOSIS — F4310 Post-traumatic stress disorder, unspecified: Secondary | ICD-10-CM

## 2020-11-29 DIAGNOSIS — F319 Bipolar disorder, unspecified: Secondary | ICD-10-CM

## 2020-11-29 MED ORDER — LAMOTRIGINE 150 MG PO TABS: 75.0000 mg | ORAL_TABLET | Freq: Two times a day (BID) | ORAL | 3 refills | Status: DC

## 2020-11-29 NOTE — Progress Notes (Signed)
[facility]  Patient ID: Tristan [name],  MRN: [id number]  PCP: Patient, No Pcp Per (Inactive)  Date of Evaluation: 11/29/2020 Time spent:20 minutes  Chief Complaint:  Chief Complaint    Anxiety; Depression; Insomnia      HISTORY/CURRENT STATUS: HPI For routine med check  He decreased Lamictal, thought we had discussed that. He took 75 mg bid for about 4-6 weeks.  Then he went back up after his wife noticed he supposed to be taking him 150 mg twice a day.  He has been on that dose for about 2 weeks now.  States he felt better, less foggy headed, able to think more clearly when only taking 75 mg bid.   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  Is excited about having an online show with a friend on [employer]. Even if he does not sell anything, it will be a great opportunity of exposure of his business.  He is exercising fairly routinely.  He walks his dogs, gets on the elliptical, walked with [name] his wife last night.  He is sleeping better but does need the hydroxyzine and Sonata at times.  He is able to enjoy things as noted above.  Energy and motivation are good.  Not isolating as much.  No suicidal or homicidal thoughts  Patient denies increased energy with decreased need for sleep, no increased talkativeness, no racing thoughts, no impulsivity or risky behaviors, no increased spending, no increased libido, no grandiosity, no increased irritability or anger, and no hallucinations.  Denies dizziness, syncope, seizures, numbness, tingling, tremor, tics, unsteady gait, slurred speech, confusion. Denies muscle or joint pain, stiffness, or dystonia.  Individual Medical History/ Review of Systems: Changes? :No   Past medications for mental health diagnoses include: Celexa, Lamictal, Lexapro, Seroquel, prazosin, Trileptal, trazodone, Vistaril, Sonata.   Allergies: Patient has no  known allergies.  Current Medications:  Current Outpatient Medications:  .  busPIRone (BUSPAR) 15 MG tablet, TAKE 3 TABLETS IN THE MORNING AND 2 TABLETS AT BEDTIME, Disp: 450 tablet, Rfl: 1 .  diazepam (VALIUM) 5 MG tablet, Take 1 tablet (5 mg total) by mouth every 8 (eight) hours as needed for anxiety., Disp: 90 tablet, Rfl: 0 .  hydrOXYzine (ATARAX/VISTARIL) 25 MG tablet, Take 1-2 tablets (25-50 mg total) by mouth every 6 (six) hours as needed for anxiety. (Patient taking differently: Take 25-50 mg by mouth daily as needed for anxiety.), Disp: 240 tablet, Rfl: 5 .  oxcarbazepine (TRILEPTAL) 600 MG tablet, TAKE 1 TABLET BY MOUTH TWICE A DAY, Disp: 180 tablet, Rfl: 1 .  zaleplon (SONATA) 10 MG capsule, One po qhs prn sleep and may repeat 1 for MNA if has 3 hours left to sleep prn., Disp: 60 capsule, Rfl: 1 .  lamoTRIgine (LAMICTAL) 150 MG tablet, Take 0.5 tablets (75 mg total) by mouth 2 (two) times daily., Disp: 180 tablet, Rfl: 3 Medication Side Effects: none  Family Medical/ Social History: Changes? No   MENTAL HEALTH EXAM:  There were no vitals taken for this visit.There is no height or weight on file to calculate BMI.  General Appearance: Casual, Neat and Well Groomed  Eye Contact:  Good  Speech:  Clear and Coherent and Normal Rate  Volume:  Normal  Mood:  Euthymic  Affect:  Appropriate  Thought Process:  Goal Directed and Descriptions of Associations: Intact  Orientation:  Full (Time, Place, and Person)  Thought Content: Logical   Suicidal Thoughts:  No  Homicidal Thoughts:  No  Memory:  WNL  Judgement:  Good  Insight:  Good  Psychomotor Activity:  Normal  Concentration:  Concentration: Good  Recall:  Good  Fund of Knowledge: Good  Language: Good  Assets:  Desire for Improvement  ADL's:  Intact  Cognition: WNL  Prognosis:  Good    DIAGNOSES:    ICD-10-CM   1. Bipolar I disorder (HCC)  F31.9   2. Generalized anxiety disorder  F41.1   3. PTSD (post-traumatic stress  disorder)  F43.10     Receiving Psychotherapy: Yes  Dr. Mardelle Matte Mitchum   RECOMMENDATIONS:  PDMP was reviewed. I provided 20 mins of face to face time during this encounter, including time spent before and after the visit in records review and charting. There was confusion of the Lamictal dosing. But he felt better on the lower dose, had more clarity of thinking. Decrease Lamictal to 75 mg, bid. Continue Buspar 15 mg, 3 in am and 2 po qhs.. Cont Valium 5 mg po tid prn. Cont Hydroxyzine 25 mg, 1-2 po Continue Trileptal 600, 1 po bid. Cont Sonata 10 mg qhs prn sleep.  Cont therapy with Dr. [name]. Return in 3 months,   Melony Overly, [place]

## 2020-12-13 ENCOUNTER — Other Ambulatory Visit: Payer: Self-pay | Admitting: Physician Assistant

## 2020-12-14 ENCOUNTER — Ambulatory Visit (INDEPENDENT_AMBULATORY_CARE_PROVIDER_SITE_OTHER): Payer: BC Managed Care – PPO | Admitting: Psychiatry

## 2020-12-14 ENCOUNTER — Other Ambulatory Visit: Payer: Self-pay

## 2020-12-14 DIAGNOSIS — F458 Other somatoform disorders: Secondary | ICD-10-CM

## 2020-12-14 DIAGNOSIS — Z8659 Personal history of other mental and behavioral disorders: Secondary | ICD-10-CM

## 2020-12-14 DIAGNOSIS — F319 Bipolar disorder, unspecified: Secondary | ICD-10-CM | POA: Diagnosis not present

## 2020-12-14 DIAGNOSIS — Z636 Dependent relative needing care at home: Secondary | ICD-10-CM

## 2020-12-14 DIAGNOSIS — F411 Generalized anxiety disorder: Secondary | ICD-10-CM

## 2020-12-14 DIAGNOSIS — Z9189 Other specified personal risk factors, not elsewhere classified: Secondary | ICD-10-CM

## 2020-12-14 DIAGNOSIS — Z630 Problems in relationship with spouse or partner: Secondary | ICD-10-CM

## 2020-12-14 NOTE — Progress Notes (Addendum)
Psychotherapy Progress Note [facility] Tristan Czar, PhD LP  Patient ID: Tristan [name]     MRN: [id number] Therapy format: Individual psychotherapy Date: 12/14/2020      Start: 8:16a     Stop: 9:-2a     Time Spent: 46 min Location: In-person   Session narrative (presenting needs, interim history, self-report of stressors and symptoms, applications of prior therapy, status changes, and interventions made in session) Was able to set some parameters with Tristan [name] 3 weeks ago, make some suggestions that seemed more like they would be taken seriously.  Progress in that he got a job and that he has been able to take encouragement if Tristan [name] offers steps to restabilize finances.  Still prone to pine about his wife, who seems long gone at this point and intent on taking from him.  Tristan [name] feeling some relief of tension, anxiety, and potential resentment.  Down from 9/10 to 2/10 anxiety.  Clarified what Tristan [name] would and would not be willing to do going forward: willing to put in another help day finding salable things, provided they have an expectations conversation about not haggling over price, not willing to let him move in, or give more than $50 without a clear agreement what the money is for.  Dental issue -- has noticed himself now mincing/grinding front teeth, trying to let go the habit.  Introduced exercises to break up automatized grinding and to imaginally desensitize to being in the dentist's chair.  Also possibility of imagining (and maybe writing out) how he would address something turning wrong.  Tristan [name] still agitated, boss on her nerves.  Now on her 2nd job since leaving her "soul-crushing" former job with [employer].  A few months now, with change of supervisor, dealing wit ha new series of mission-inappropriate demands on her time/energy.  She is Librarian, academic calls and accepted another offer, has put in notice.  Sees her asserting herself fine, foresees her enjoying the next  work.  Still apprehensive about asking her what she could use from him, for fear of getting dumped on.  Suggested that she will not necessarily open the floodgates that way, and the demonstrated openness should help with warmth, intimacy, and release of tension.  Advocated just ask how she's feeling, and if she goes into a lot of news and complaints, reiterate the question.  Discussed Tristan [name]'s needs and stresses, what she may need from him going into Mother's Day, given the elephant still in the room re. their abortion.  Flowers and uncomplicated empathy suggested.  Therapeutic modalities: Cognitive Behavioral Therapy, Solution-Oriented/Positive Psychology, and Assertiveness/Communication  Mental Status/Observations:  Appearance:   Casual     Behavior:  Appropriate  Motor:  Normal  Speech/Language:   Clear and Coherent  Affect:  Appropriate  Mood:  dysthymic  Thought process:  normal  Thought content:    WNL  Sensory/Perceptual disturbances:    WNL  Orientation:  Fully oriented  Attention:  Good    Concentration:  Good  Memory:  WNL  Insight:    Good  Judgment:   Good  Impulse Control:  Fair   Risk Assessment: Danger to Self: No Self-injurious Behavior: No Danger to Others: No Physical Aggression / Violence: No Duty to Warn: No Access to Firearms a concern: No  Assessment of progress:  stabilized  Diagnosis:   ICD-10-CM   1. Generalized anxiety disorder  F41.1     2. Bipolar I disorder (HCC)  F31.9     3. History of posttraumatic  stress disorder (PTSD)  Z86.59     4. 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 At risk for dental problems  Z91.89     6. Relationship problem between partners  Z63.0     7. Caregiver stress  Z63.6      Plan:  Engage possible dentist(s) explicitly about addressing both dental neglect and dental phobia, inquire clearly about having a paid, noninvasive meeting before engaging services.  Personal recommendation Tristan [name], but understood negative  associations. Imaginal practice tolerating the environment, procedures and speaking up to address concerns rather than avoiding Anti-bruxism exercises Mother's Day gestures as noted Other recommendations/advice as may be noted above Continue to utilize previously learned skills ad lib Maintain medication as prescribed and work faithfully with relevant prescriber(s) if any changes are desired or seem indicated Call the clinic on-call service, present to ER, or call 911 if any life-threatening psychiatric crisis Return 2-3 wks. Already scheduled visit in this office 12/26/2020.  Tristan Fries, PhD Tristan Czar, PhD LP Clinical Psychologist, [facility] [address] [place], [place] [address] [phone]

## 2020-12-26 ENCOUNTER — Ambulatory Visit (INDEPENDENT_AMBULATORY_CARE_PROVIDER_SITE_OTHER): Payer: BC Managed Care – PPO | Admitting: Psychiatry

## 2020-12-26 ENCOUNTER — Other Ambulatory Visit: Payer: Self-pay | Admitting: Physician Assistant

## 2020-12-26 ENCOUNTER — Other Ambulatory Visit: Payer: Self-pay

## 2020-12-26 DIAGNOSIS — F40298 Other specified phobia: Secondary | ICD-10-CM

## 2020-12-26 DIAGNOSIS — F319 Bipolar disorder, unspecified: Secondary | ICD-10-CM

## 2020-12-26 DIAGNOSIS — F411 Generalized anxiety disorder: Secondary | ICD-10-CM

## 2020-12-26 DIAGNOSIS — Z630 Problems in relationship with spouse or partner: Secondary | ICD-10-CM

## 2020-12-26 DIAGNOSIS — G4700 Insomnia, unspecified: Secondary | ICD-10-CM

## 2020-12-26 DIAGNOSIS — Z9189 Other specified personal risk factors, not elsewhere classified: Secondary | ICD-10-CM

## 2020-12-26 DIAGNOSIS — Z8659 Personal history of other mental and behavioral disorders: Secondary | ICD-10-CM

## 2020-12-26 NOTE — Progress Notes (Signed)
Psychotherapy Progress Note [facility] [name], PhD LP  Patient ID: Tristan [name]     MRN: [id number] Therapy format: Individual psychotherapy Date: 12/26/2020      Start: 9:14a     Stop: 10:01a     Time Spent: 47 min Location: In-person   Session narrative (presenting needs, interim history, self-report of stressors and symptoms, applications of prior therapy, status changes, and interventions made in session) Been awakening early last couple days, he and [name], no clear reason, just apprehensive.  One high-dollar situation for [employer], just worked through.  [name] finished up old job, spent a day and a half together, prospect of better quality time, at least temporarily.  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  Stands in tension with her sense of guilt making good money (vs. 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  Survived Mother's Day emotionally, cathartic to get through it, and for [name] to acknowledge to some well wishers how it's complicate and painful and not a "happy" day.  [place] leak about forthcoming abortion law decision is naturally making for tremendous upset and outrage on her part, and while they agree, it's just not possible (as a man, and as the one who pressed the issue to have one) to provide certain support or a vivid opinion without provoking her, he feels.  Reviewed understandings of what is and is not at stake emotionally between them, recommended he dare to clarify with [name] whether she harbors any feeling of being coerced into the abortion they had, and whether she needs him to help protect her from reminders going forward, e.g., babies on TV and changing the channel.  Encouraged  perspective that it is always kindness to clarify her wishes, even if they are contradictory or loaded.   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  Recent wave of people close to them contracting COVID is another stress, powering continued vigilance about masking and social distancing, plus worry on [name]'s part about things like an alternative medicine procedure in which a friend of hers blew an herbal solution through her sinuses and turned up with COVID 2 days later.  Still on hold getting dental matters addressed.  Still willing to engage a prospective dentist for an interview-only visit, does feel ready to represent himself as a dental anxiety patient, ask questions about how things would work and what interviewee would do with experienced or imagined problems.  Encouraged to develop his questions and rehearse his preferred way of saying he has a bad experience and is anxious.  Therapeutic modalities: Cognitive Behavioral Therapy, Solution-Oriented/Positive Psychology, Ego-Supportive, and Assertiveness/Communication  Mental Status/Observations:  Appearance:   Casual     Behavior:  Appropriate  Motor:  Normal  Speech/Language:   Clear and Coherent  Affect:  Appropriate  Mood:  anxious and vaguely irritated, more threatened  Thought process:  normal  Thought content:    WNL  Sensory/Perceptual disturbances:    WNL  Orientation:  Fully oriented  Attention:  Good    Concentration:  Good  Memory:  WNL  Insight:    Good  Judgment:   Good  Impulse Control:  Good  Risk Assessment: Danger to Self: No Self-injurious Behavior: No Danger to Others: No Physical Aggression / Violence: No Duty to Warn: No Access to Firearms a concern: No  Assessment of progress:  progressing  Diagnosis:   ICD-10-CM   1. Generalized anxiety disorder  F41.1     2. Relationship problem between partners  Z63.0     3.  Bipolar I disorder (HCC)  F31.9     4. 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 At risk for dental problems  Z91.89     6. Insomnia, unspecified type  G47.00     7. History of posttraumatic stress disorder (PTSD)  Z86.59      Plan:  Encourage continuing quality time with [name] as available Encourage clarifying conversation with [name] about residual issues, anything personal remaining concerning their abortion history, with an eye toward confirming forgiveness, retiring any doubt/guilt about coercing her, and clarifying together whether to increase, continue, or retire measures to protect her from exposure to reminders Identify a couple prospects for dentists, practice inviting and having an interview-only visit to build confidence Other recommendations/advice as may be noted above Continue to utilize previously learned skills ad lib Maintain medication as prescribed and work faithfully with relevant prescriber(s) if any changes are desired or seem indicated Call the clinic on-call service, present to ER, or call 911 if any life-threatening psychiatric crisis Return for session(s) already scheduled. Already scheduled visit in this office 01/09/2021.  [name], PhD [name], PhD LP Clinical Psychologist, [facility] [address], [address] [place], [place] [address] [phone]

## 2020-12-28 ENCOUNTER — Other Ambulatory Visit: Payer: Self-pay

## 2020-12-28 ENCOUNTER — Telehealth: Payer: Self-pay | Admitting: Physician Assistant

## 2020-12-28 MED ORDER — LAMOTRIGINE 150 MG PO TABS: 75.0000 mg | ORAL_TABLET | Freq: Two times a day (BID) | ORAL | 3 refills | Status: DC

## 2020-12-28 NOTE — Telephone Encounter (Signed)
Please review

## 2020-12-28 NOTE — Telephone Encounter (Signed)
Rx sent 

## 2020-12-28 NOTE — Telephone Encounter (Signed)
Pt called because pharmacy said his lamictal was denied. However the script on 11/29/20 did not escribe it only printed. [facility]

## 2021-01-09 ENCOUNTER — Ambulatory Visit (INDEPENDENT_AMBULATORY_CARE_PROVIDER_SITE_OTHER): Payer: BC Managed Care – PPO | Admitting: Psychiatry

## 2021-01-09 ENCOUNTER — Other Ambulatory Visit: Payer: Self-pay

## 2021-01-09 DIAGNOSIS — G4700 Insomnia, unspecified: Secondary | ICD-10-CM

## 2021-01-09 DIAGNOSIS — F319 Bipolar disorder, unspecified: Secondary | ICD-10-CM

## 2021-01-09 DIAGNOSIS — Z8659 Personal history of other mental and behavioral disorders: Secondary | ICD-10-CM | POA: Diagnosis not present

## 2021-01-09 DIAGNOSIS — Z630 Problems in relationship with spouse or partner: Secondary | ICD-10-CM | POA: Diagnosis not present

## 2021-01-09 DIAGNOSIS — F4321 Adjustment disorder with depressed mood: Secondary | ICD-10-CM

## 2021-01-09 DIAGNOSIS — Z9189 Other specified personal risk factors, not elsewhere classified: Secondary | ICD-10-CM

## 2021-01-09 DIAGNOSIS — F411 Generalized anxiety disorder: Secondary | ICD-10-CM | POA: Diagnosis not present

## 2021-01-09 NOTE — Progress Notes (Signed)
Psychotherapy Progress Note [facility] [name], PhD LP  Patient ID: Tristan [name]     MRN: [id number] Therapy format: Individual psychotherapy Date: 01/09/2021      Start: 8:14a     Stop: 9:00a     Time Spent: 46 min Location: In-person   Session narrative (presenting needs, interim history, self-report of stressors and symptoms, applications of prior therapy, status changes, and interventions made in session) Missed morning meds Sunday morning, still getting back to level, noticed himself getting overinvested in bad news and his nightmares of grief (pets), "sibling stuff" (being approached for reconciliation w/o acknowledgment, by Bud, who was one of the closest before problems, but felt somewhat burned, last hurt before signing off with entire family), [name] (and the abortion).  More distressed 4-5 days for it all.  Affected by dog Lancelot being hyper and [name] getting sparked into a panic attack by him yelping and obsessing a couple days about whether something was medically wrong, until [name] complied with getting an unnecessary emergency vet visit. (Clint Burdeen of [place], said to be astute about owner anxiety, kept the charge minimal.)  [name] now in guilt attack over how she reacted, calming gradually.  Reinforced approach that it's OK she has all of her feelings, no true harm done, let's just try to learn from it toward next time a strong emotional alarm goes off.  She has had one week off between jobs, engaged her newest job last week, another scheduled week open this week.  Briefly discussed proactively offering to [facility] choice over what to do with weather-affected plans this week.    June 6 is wedding anniversary and anniversary of mother's death -- just surprised to find he's not in his usual 6 wks of apprehension, but insight that his dream activity is probably motivated by subconsciously attending to it.  Been thinking of going to the colum[place], just  needs to be ure to avoid running into siblings.  Affirmed the value in going, paying respects, and breaking through habit of self-suppression and allowing his own grief some air time.  Likewise, affirmed doing some reasonable planning for anniversary, something to show love for [name] but also be willing to maybe lead her in "making the unspeakable speakable" when it comes to her own sad season around Mother's Day (not yet too distant to be acknowledged).    Therapeutic modalities: Cognitive Behavioral Therapy, Solution-Oriented/Positive Psychology and Humanistic/Existential  Mental Status/Observations:  Appearance:   Casual     Behavior:  Appropriate  Motor:  Normal  Speech/Language:   Clear and Coherent  Affect:  Appropriate  Mood:  anxious and dysthymic  Thought process:  normal  Thought content:    WNL  Sensory/Perceptual disturbances:    WNL  Orientation:  Fully oriented  Attention:  Good    Concentration:  Good  Memory:  WNL  Insight:    Good  Judgment:   Good  Impulse Control:  Good   Risk Assessment: Danger to Self: No Self-injurious Behavior: No Danger to Others: No Physical Aggression / Violence: No Duty to Warn: No Access to Firearms a concern: No  Assessment of progress:  progressing  Diagnosis:   ICD-10-CM   1. Generalized anxiety disorder  F41.1   2. History of posttraumatic stress disorder (PTSD)  Z86.59   3. Bipolar I disorder (HCC)  F31.9   4. Relationship problem between partners  Z63.0   5. Grief  F43.21   6. Insomnia, unspecified type  G47.00  7. At risk for dental problems  Z91.89    Plan:  . Look for opportunities to empathetically acknowledge [name]'s mixed feelings and the season she is in . Encourage follow through on anniversary and grave visit plans . Encourage offer "plan B" choice for weather-spoiled plans together . Encourage offer no need to agonize over posttraumatic reactions to dog, abortion, etc. . Other recommendations/advice as may be  noted above . Continue to utilize previously learned skills ad lib . Maintain medication as prescribed and work faithfully with relevant prescriber(s) if any changes are desired or seem indicated . Call the clinic on-call service, present to ER, or call 911 if any life-threatening psychiatric crisis Return in about 2 weeks (around 01/23/2021) for session(s) already scheduled. . Already scheduled visit in this office 01/29/2021.  [name], PhD [name], PhD LP Clinical Psychologist, [facility] [address] [place], [place] [address] [phone]

## 2021-01-23 ENCOUNTER — Other Ambulatory Visit: Payer: Self-pay | Admitting: Physician Assistant

## 2021-01-24 ENCOUNTER — Other Ambulatory Visit: Payer: Self-pay | Admitting: Physician Assistant

## 2021-01-25 NOTE — Telephone Encounter (Signed)
Last filled 10/24/20

## 2021-01-29 ENCOUNTER — Encounter: Payer: Self-pay | Admitting: Psychiatry

## 2021-01-29 ENCOUNTER — Ambulatory Visit: Payer: BC Managed Care – PPO | Admitting: Psychiatry

## 2021-01-29 NOTE — Progress Notes (Signed)
Admin note for non-service contact  Patient ID: Tristan [name]  MRN: [id number] DATE: 01/29/2021  NS for 8am session.  Is scheduled ahead, no indication of dangerousness at this time.  [name], PhD [name], PhD LP Clinical Psychologist, [facility] [address] [place], [place] [address] [phone]

## 2021-02-20 ENCOUNTER — Other Ambulatory Visit: Payer: Self-pay

## 2021-02-20 ENCOUNTER — Ambulatory Visit (INDEPENDENT_AMBULATORY_CARE_PROVIDER_SITE_OTHER): Payer: 59 | Admitting: Psychiatry

## 2021-02-20 DIAGNOSIS — F411 Generalized anxiety disorder: Secondary | ICD-10-CM | POA: Diagnosis not present

## 2021-02-20 DIAGNOSIS — Z9189 Other specified personal risk factors, not elsewhere classified: Secondary | ICD-10-CM

## 2021-02-20 DIAGNOSIS — Z630 Problems in relationship with spouse or partner: Secondary | ICD-10-CM

## 2021-02-20 DIAGNOSIS — F40298 Other specified phobia: Secondary | ICD-10-CM

## 2021-02-20 DIAGNOSIS — Z8659 Personal history of other mental and behavioral disorders: Secondary | ICD-10-CM | POA: Diagnosis not present

## 2021-02-20 DIAGNOSIS — F319 Bipolar disorder, unspecified: Secondary | ICD-10-CM | POA: Diagnosis not present

## 2021-02-20 NOTE — Progress Notes (Signed)
Psychotherapy Progress Note [facility] Tristan Czar, PhD LP  Patient ID: Tristan [name]     MRN: [id number] Therapy format: Individual psychotherapy Date: 02/20/2021      Start: 8:14a     Stop: 9:00a     Time Spent: 46 min Location: In-person   Session narrative (presenting needs, interim history, self-report of stressors and symptoms, applications of prior therapy, status changes, and interventions made in session) Storm yesterday brought out how anxious the new dog is.  Tristan [name] found in the neighborhood, which got Tristan [name] wound up checking on it, possibly at her own risk, and contrary to Animal control advice to leave it alone, report it if certain behaviors manifest.  Several weeks ago had more significant conflict about her dismissing a dog interest of his out of hand as too big, too powerful, then turning around and proposing instead a pit bull.  More personally, Tristan [name] has been asking whether she's been getting too difficult emotionally, posing awkward questions for him, a good sign for vulnerability and insight but intimidating to answer.  Have had some productive, open discussion about the abortion, too.  Found out that Tristan [name] keeps the sonogram and looks at it every couple of months, and when asked why she would provoke herself that way, says "because it doesn't feel real sometimes".  Clearer than ever to Tristan [name] that she has an old tendency to wallow in sad things, possibly with secondary gain of it paining her until she's free to displace and outrage sometimes.  Reviewed and affirmed handling.  Notice that Tristan [name] has been neglectful of holidays over the past number of months -- birthday, Tristan [name], Valentine's, and anniversary all, while he has "gone over the top" with his gestures.  Acknowledges feeling neglected, possibly shunned.  Discussed option to ask about it rather than hold onto it.  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  Will involve a reception  and public speaking moment, which are both eager and apprehensive.  Getting more focused than he usually has in the summer, when weather conditions can spoil aspects of the process.  Very validating to be sought out for promotion as a solo show.    Therapeutic modalities: Cognitive Behavioral Therapy and Solution-Oriented/Positive Psychology  Mental Status/Observations:  Appearance:   Casual     Behavior:  Appropriate  Motor:  Normal  Speech/Language:   Clear and Coherent  Affect:  Appropriate  Mood:  normal and tense with issue  Thought process:  normal  Thought content:    WNL  Sensory/Perceptual disturbances:    WNL  Orientation:  Fully oriented  Attention:  Good    Concentration:  Good  Memory:  WNL  Insight:    Good  Judgment:   Good  Impulse Control:  Good   Risk Assessment: Danger to Self: No Self-injurious Behavior: No Danger to Others: No Physical Aggression / Violence: No Duty to Warn: No Access to Firearms a concern: No  Assessment of progress:  progressing  Diagnosis:   ICD-10-CM   1. Generalized anxiety disorder  F41.1     2. Relationship problem between partners  Z63.0     3. Bipolar I disorder (HCC)  F31.9     4. History of posttraumatic stress disorder (PTSD)  Z86.59     5. 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  Possibly look into availability of bad-weather workspace options  Other recommendations/advice as may be noted above Continue to utilize previously learned skills ad lib Maintain medication as prescribed and work faithfully with relevant prescriber(s) if any changes are desired  or seem indicated Call the clinic on-call service, present to ER, or call 911 if any life-threatening psychiatric crisis Return for session(s) already scheduled. Already scheduled visit in this office 02/28/2021.  Tristan Fries, PhD Tristan Czar, PhD LP Clinical Psychologist, [facility] [address] [place], [place] [address] [phone]

## 2021-02-28 ENCOUNTER — Ambulatory Visit: Payer: BC Managed Care – PPO | Admitting: Physician Assistant

## 2021-03-13 ENCOUNTER — Other Ambulatory Visit: Payer: Self-pay

## 2021-03-13 ENCOUNTER — Ambulatory Visit (INDEPENDENT_AMBULATORY_CARE_PROVIDER_SITE_OTHER): Payer: 59 | Admitting: Psychiatry

## 2021-03-13 DIAGNOSIS — F319 Bipolar disorder, unspecified: Secondary | ICD-10-CM | POA: Diagnosis not present

## 2021-03-13 DIAGNOSIS — Z630 Problems in relationship with spouse or partner: Secondary | ICD-10-CM

## 2021-03-13 DIAGNOSIS — F411 Generalized anxiety disorder: Secondary | ICD-10-CM | POA: Diagnosis not present

## 2021-03-13 DIAGNOSIS — Z8659 Personal history of other mental and behavioral disorders: Secondary | ICD-10-CM

## 2021-03-13 NOTE — Progress Notes (Signed)
Psychotherapy Progress Note [facility] [name], PhD LP  Patient ID: Tristan [name]     MRN: [id number] Therapy format: Individual psychotherapy Date: 03/13/2021      Start: 8:14a     Stop: 9:01a     Time Spent: 47 min Location: In-person   Session narrative (presenting needs, interim history, self-report of stressors and symptoms, applications of prior therapy, status changes, and interventions made in session) Tried to refer [name] to colleague [name], but front staff seemed to tell her it would be at discretion and a long wait.  Whatever got lost in translation discouraged her.  Agreed to intervene with staff and [name] to secure her admission, just recommend [name] call back and be willing to say that it is an internal referral from myself.    Positive anxiety exposure talking with [name] recently, disclosing more vulnerably.  Talked openly about the emotional residue from the abortion a few years ago.  New trigger for betsy with a neighbor gleefully running over to invite them to a gender reveal party.  Able to be of some comfort to her.  Some productive sharing about feeling antsy engaging.  Still hard to secure time together, still perceives some reluctance and isolating on [name]'s part, as well ass his own reluctance to seem pressuring asking for time.  Still encouraged to be forthright in asking, open to the outcome either way.    Discussed perspective on [name]'s own struggles.  Sees her straining to prove she she is caring and kind and not like her mother.  For instance, looking after neglected plants and damaged animals, seemingly always a need to rescue.  Discussed possibility of perception-checking with her about her needs, offering reassurances, and helping make her unspeakable regrets more speakable, by offering his own language for what it seems like and let her assent or correct.  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  8/13 will the show, in [place], his first solo exhibit.  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  Affirmed and encouraged.  Therapeutic modalities: Cognitive Behavioral Therapy, Solution-Oriented/Positive Psychology, Ego-Supportive, and Assertiveness/Communication  Mental Status/Observations:  Appearance:   Casual     Behavior:  Appropriate  Motor:  Normal  Speech/Language:   Clear and Coherent  Affect:  Appropriate  Mood:  anxious  Thought process:  normal  Thought content:    WNL  Sensory/Perceptual disturbances:    WNL  Orientation:  Fully oriented  Attention:  Good    Concentration:  Good  Memory:  WNL  Insight:    Good  Judgment:   Good  Impulse Control:  Good   Risk Assessment: Danger to Self: No Self-injurious Behavior: No Danger to Others: No Physical Aggression / Violence: No Duty to Warn: No Access to Firearms a concern: No  Assessment of progress:  progressing well  Diagnosis:   ICD-10-CM   1. Generalized anxiety disorder  F41.1     2. Relationship problem between partners  Z63.0     3. Bipolar I disorder (HCC)  F31.9     4. History of posttraumatic stress disorder (PTSD)  Z86.59      Plan:  Continue seeking emotional intimacy with [name].  Be forthright in asking, open to the outcome either way. Emphasize finding  the fun in sharing his work -- passion speaks, regardless of phrasing, being articulate, etc. Return to [name] and dental issues as able Other recommendations/advice as may be noted above Continue to utilize previously learned skills ad lib Maintain medication as prescribed and work faithfully with relevant prescriber(s) if any changes are desired or seem indicated Call the clinic on-call service, present to ER, or call 911 if any life-threatening  psychiatric crisis No follow-ups on file. Already scheduled visit in this office 03/29/2021.  [name], PhD [name], PhD LP Clinical Psychologist, [facility] [address], [address] [place], [place] [address] [phone]

## 2021-03-25 ENCOUNTER — Other Ambulatory Visit: Payer: Self-pay | Admitting: Physician Assistant

## 2021-03-27 NOTE — Telephone Encounter (Signed)
Please review quantity.

## 2021-03-29 ENCOUNTER — Ambulatory Visit (INDEPENDENT_AMBULATORY_CARE_PROVIDER_SITE_OTHER): Payer: 59 | Admitting: Psychiatry

## 2021-03-29 ENCOUNTER — Other Ambulatory Visit: Payer: Self-pay

## 2021-03-29 DIAGNOSIS — F411 Generalized anxiety disorder: Secondary | ICD-10-CM | POA: Diagnosis not present

## 2021-03-29 DIAGNOSIS — F319 Bipolar disorder, unspecified: Secondary | ICD-10-CM

## 2021-03-29 DIAGNOSIS — Z8659 Personal history of other mental and behavioral disorders: Secondary | ICD-10-CM

## 2021-03-29 DIAGNOSIS — Z630 Problems in relationship with spouse or partner: Secondary | ICD-10-CM

## 2021-03-29 NOTE — Progress Notes (Signed)
Psychotherapy Progress Note [facility] Tristan Czar, PhD LP  Patient ID: Tristan [name]     MRN: [id number] Therapy format: Individual psychotherapy Date: 03/29/2021      Start: 11:16a     Stop: 12:04p     Time Spent: 48 min Location: In-person   Session narrative (presenting needs, interim history, self-report of stressors and symptoms, applications of prior therapy, status changes, and interventions made in session) Show tomorrow in [place], need to be up 5am, and rough time finding dog sitters.  Pleased with the materials he has ready.  Tristan [name] got scheduled with Tristan [name] and has seen her twice now, and Tristan [name] has wanted to share a lot from her sessions.  Affirmed as progress and safety signal for anxiety dealing with her.  Anticipates tension tomorrow with Tristan [name], some risk of "meltdown" as he tightens up for the show.  Knows that nervous questions and offers from her and her feeling of maybe having to be present all th time for his sake tend to distract him.  Wants her to feel free to come and go, actually, get what she needs out of visiting, and focus himself.  She had a multiple hornet sting last Thursday, went through prednisone treatment, and she lost a precious part of her routine in weekend yard work.  Even lost freedom to use the phone since her swelling interfered with wearing her glasses.  Mad scramble is expected tomorrow morning, plus Tristan [name] nerves about getting on the road by dark.  Reviewed and strategized for best responses.  Offered possibility of one or both of them constructing an "emotional scavenger hunt" to turn anticipatory anxiety into more of a game.  Update on Tristan [name] -- surprise needs this week, as he learned of his estranged wife in the hospital, manic.  Felt dutybound to try to get in touch, ran afoul of his inlaws, and then his stepdaughter spilled the beans about her new relationship.  Figures to be more emotionally on call come Sunday or  Monday.    Therapeutic modalities: Cognitive Behavioral Therapy, Solution-Oriented/Positive Psychology, and Ego-Supportive  Mental Status/Observations:  Appearance:   Casual     Behavior:  Appropriate  Motor:  Normal  Speech/Language:   Clear and Coherent  Affect:  Appropriate  Mood:  anxious  Thought process:  normal  Thought content:    WNL  Sensory/Perceptual disturbances:    WNL  Orientation:  Fully oriented  Attention:  Good    Concentration:  Good  Memory:  WNL  Insight:    Good  Judgment:   Good  Impulse Control:  Good   Risk Assessment: Danger to Self: No Self-injurious Behavior: No Danger to Others: No Physical Aggression / Violence: No Duty to Warn: No Access to Firearms a concern: No  Assessment of progress:  progressing  Diagnosis:   ICD-10-CM   1. Generalized anxiety disorder  F41.1     2. Relationship problem between partners  Z63.0     3. Bipolar I disorder (HCC)  F31.9     4. History of posttraumatic stress disorder (PTSD)  Z86.59      Plan:  Work with Tristan [name] on anxiety scavenger hunt this weekend Continue working on assertiveness in asking for listening, relief/shortening, and perception-checking with Tristan [name] Continue seeking emotional intimacy with Tristan [name].  Be forthright in asking, open to the outcome either way. Emphasize finding the fun in sharing own work -- passion speaks, regardless of phrasing, being articulate, etc. Return to [facility] and dental  issues as able Other recommendations/advice as may be noted above Continue to utilize previously learned skills ad lib Maintain medication as prescribed and work faithfully with relevant prescriber(s) if any changes are desired or seem indicated Call the clinic on-call service, present to ER, or call 911 if any life-threatening psychiatric crisis No follow-ups on file. Already scheduled visit in this office 04/16/2021.  Tristan Fries, PhD Tristan Czar, PhD LP Clinical Psychologist, [facility]  Group [facility] [address], [address] [place], [place] [address] [phone]

## 2021-04-16 ENCOUNTER — Other Ambulatory Visit: Payer: Self-pay

## 2021-04-16 ENCOUNTER — Ambulatory Visit (INDEPENDENT_AMBULATORY_CARE_PROVIDER_SITE_OTHER): Payer: 59 | Admitting: Physician Assistant

## 2021-04-16 ENCOUNTER — Encounter: Payer: Self-pay | Admitting: Physician Assistant

## 2021-04-16 DIAGNOSIS — F319 Bipolar disorder, unspecified: Secondary | ICD-10-CM | POA: Diagnosis not present

## 2021-04-16 DIAGNOSIS — G4700 Insomnia, unspecified: Secondary | ICD-10-CM | POA: Diagnosis not present

## 2021-04-16 DIAGNOSIS — Z8659 Personal history of other mental and behavioral disorders: Secondary | ICD-10-CM

## 2021-04-16 DIAGNOSIS — F411 Generalized anxiety disorder: Secondary | ICD-10-CM

## 2021-04-16 MED ORDER — OXCARBAZEPINE 600 MG PO TABS: 600.0000 mg | ORAL_TABLET | Freq: Two times a day (BID) | ORAL | 3 refills | Status: DC

## 2021-04-16 NOTE — Progress Notes (Signed)
[facility] Med Check  Patient ID: Tristan [name],  MRN: [id number]  PCP: Patient, No Pcp Per (Inactive)  Date of Evaluation: 04/16/2021  time spent:20 minutes  Chief Complaint:  Chief Complaint   Anxiety; Depression; Follow-up; Insomnia      HISTORY/CURRENT STATUS: HPI For routine med check  Is doing pretty well. Meds are working. The anniversary of his wife's dad's death is coming up and that's been stressful for her, then in turn him. But overall he's doing well. Able to enjoy things, energy and motivation are good. Sleeps well most of the time, just not lately b/c of this stress. Not isolating. Anxiety is controlled with the current medications. Appetite is good, eats well. No SI/HI.   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  Patient denies increased energy with decreased need for sleep, no increased talkativeness, no racing thoughts, no impulsivity or risky behaviors, no increased spending, no increased libido, no grandiosity, no increased irritability or anger, no paranoia, and no hallucinations.  Denies dizziness, syncope, seizures, numbness, tingling, tremor, tics, unsteady gait, slurred speech, confusion. Denies muscle or joint pain, stiffness, or dystonia.  Individual Medical History/ Review of Systems: Changes? :No   Past medications for mental health diagnoses include: Celexa, Lamictal, Lexapro, Seroquel, prazosin, Trileptal, trazodone, Vistaril, Sonata.   Allergies: Patient has no known allergies.  Current Medications:  Current Outpatient Medications:    busPIRone (BUSPAR) 15 MG tablet, TAKE 3 TABLETS IN THE MORNING AND 2 TABLETS AT BEDTIME, Disp: 450 tablet, Rfl: 0   diazepam (VALIUM) 5 MG tablet, Take 1 tablet (5 mg total) by mouth every 8 (eight) hours as needed for anxiety., Disp: 90 tablet, Rfl: 0   hydrOXYzine (ATARAX/VISTARIL) 25 MG tablet,  Take 1-2 tablets (25-50 mg total) by mouth every 6 (six) hours as needed for anxiety. (Patient taking differently: Take 25-50 mg by mouth daily as needed for anxiety.), Disp: 240 tablet, Rfl: 5   lamoTRIgine (LAMICTAL) 150 MG tablet, Take 0.5 tablets (75 mg total) by mouth 2 (two) times daily., Disp: 180 tablet, Rfl: 3   zaleplon (SONATA) 10 MG capsule, TAKE 1 CAPSULE AT BEDTIME AS NEEDED FOR SLEEP (MAY REPEAT 1 CAPSULE IF HAS 3 HOURS LEFT TO SLEEP), Disp: 60 capsule, Rfl: 1   oxcarbazepine (TRILEPTAL) 600 MG tablet, Take 1 tablet (600 mg total) by mouth 2 (two) times daily., Disp: 180 tablet, Rfl: 3 Medication Side Effects: none  Family Medical/ Social History: Changes? No   MENTAL HEALTH EXAM:  There were no vitals taken for this visit.There is no height or weight on file to calculate BMI.  General Appearance: Casual, Neat and Well Groomed  Eye Contact:  Good  Speech:  Clear and Coherent and Normal Rate  Volume:  Normal  Mood:  Euthymic  Affect:  Appropriate  Thought Process:  Goal Directed and Descriptions of Associations: Circumstantial  Orientation:  Full (Time, Place, and Person)  Thought Content: Logical   Suicidal Thoughts:  No  Homicidal Thoughts:  No  Memory:  WNL  Judgement:  Good  Insight:  Good  Psychomotor Activity:  Normal  Concentration:  Concentration: Good  Recall:  Good  Fund of Knowledge: Good  Language: Good  Assets:  Desire for Improvement  ADL's:  Intact  Cognition: WNL  Prognosis:  Good    DIAGNOSES:    ICD-10-CM   1.  Bipolar I disorder (HCC)  F31.9     2. Generalized anxiety disorder  F41.1     3. History of posttraumatic stress disorder (PTSD)  Z86.59     4. Insomnia, unspecified type  G47.00        Receiving Psychotherapy: Yes  Dr. Mardelle Matte [place]   RECOMMENDATIONS:  PDMP was reviewed.  Last Sonata filled 01/28/2021 I provided  minutes of face to face time during this encounter, including time spent before and after the visit in records  review, medical decision making, and charting.  I am glad he is responding well to the medications.  No changes are necessary. Continue Lamictal 75 mg, bid. Continue Buspar 15 mg, 3 in am and 2 po qhs.. Cont Valium 5 mg po tid prn. Cont Hydroxyzine 25 mg, 1-2 po Continue Trileptal 600, 1 po bid. Cont Sonata 10 mg qhs prn sleep.  Cont therapy with Dr. [name]. Return in 3 months,   Melony Overly, [place]

## 2021-04-17 ENCOUNTER — Ambulatory Visit (INDEPENDENT_AMBULATORY_CARE_PROVIDER_SITE_OTHER): Payer: 59 | Admitting: Psychiatry

## 2021-04-17 DIAGNOSIS — Z630 Problems in relationship with spouse or partner: Secondary | ICD-10-CM | POA: Diagnosis not present

## 2021-04-17 DIAGNOSIS — F411 Generalized anxiety disorder: Secondary | ICD-10-CM

## 2021-04-17 DIAGNOSIS — Z8659 Personal history of other mental and behavioral disorders: Secondary | ICD-10-CM | POA: Diagnosis not present

## 2021-04-17 DIAGNOSIS — F319 Bipolar disorder, unspecified: Secondary | ICD-10-CM | POA: Diagnosis not present

## 2021-04-17 DIAGNOSIS — Z636 Dependent relative needing care at home: Secondary | ICD-10-CM

## 2021-04-17 NOTE — Progress Notes (Signed)
Psychotherapy Progress Note [facility] [name], PhD LP  Patient ID: Tristan [name]     MRN: [id number] Therapy format: Individual psychotherapy Date: 04/17/2021      Start: 9:14a     Stop: 10:02a     Time Spent: 48 min Location: In-person   Session narrative (presenting needs, interim history, self-report of stressors and symptoms, applications of prior therapy, status changes, and interventions made in session) Show in [name] was stressful enough to get to, with tension preparing to go and "a big production" from Tristan [name] leaving the dogs for the day.  Really unable to relax most of the week beforehand and the trip up, enough to be distracting to him.  Venue was different from what he imagined, more casual and less well-attended, but still focused enough and beneficial for getting his work seen.  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  For his work, has secured an agreement to limited display space now in [place].  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  With Tristan [name], still struggling with how she gets tense and angry, seemingly all the time, which is becoming more off-putting.  Knows she is dealing with dog loss -- longtime Tristan [name] last April/May, and in September anniversary of the very sick dog she hastily adopted last fall-- plus her father's 5th death anniv coming up, and then the neighbors' issues with fertility and oppressive reminders of abortion on one level, and monopolizing time on another.  Discussed possibility of Tristan [name] taking a key neighbor aside to ask for a behavior change.    Therapeutic modalities: Cognitive Behavioral Therapy, Solution-Oriented/Positive Psychology, and  Assertiveness/Communication  Mental Status/Observations:  Appearance:   Casual     Behavior:  Appropriate  Motor:  Normal  Speech/Language:   Clear and Coherent  Affect:  Appropriate  Mood:  anxious and concerned, responsive  Thought process:  normal  Thought content:    WNL  Sensory/Perceptual disturbances:    WNL  Orientation:  Fully oriented  Attention:  Good    Concentration:  Good  Memory:  WNL  Insight:    Good  Judgment:   Good  Impulse Control:  Good   Risk Assessment: Danger to Self: No Self-injurious Behavior: No Danger to Others: No Physical Aggression / Violence: No Duty to Warn: No Access to Firearms a concern: No  Assessment of progress:  progressing  Diagnosis:   ICD-10-CM   1. Generalized anxiety disorder  F41.1     2. Relationship problem between partners  Z63.0     3. Bipolar I disorder (HCC)  F31.9     4. History of posttraumatic stress disorder (PTSD)  Z86.59     5. Caregiver stress  Z63.6      Plan:  Continue applying sensitive communication tactics with Tristan [name] Check with Tristan [name] about preference for asking neighbor habit change or letting her represent herself Stand ready to comfort through evocative anniversaries and to ask Tristan [name] if what sounds irritable is actually sad underneath Other recommendations/advice as may be noted above Check back about THC and dental phobia problems next time Continue to utilize previously learned skills ad lib Maintain medication as prescribed and work faithfully with relevant prescriber(s) if any changes are desired or  seem indicated Call the clinic on-call service, present to ER, or call 911 if any life-threatening psychiatric crisis Return for session(s) already scheduled. Already scheduled visit in this office 05/10/2021.  [name], PhD [name], PhD LP Clinical Psychologist, [facility] [address], [address] [place], [place] [address] [phone]

## 2021-05-08 ENCOUNTER — Ambulatory Visit: Payer: 59 | Admitting: Psychiatry

## 2021-05-10 ENCOUNTER — Other Ambulatory Visit: Payer: Self-pay

## 2021-05-10 ENCOUNTER — Ambulatory Visit (INDEPENDENT_AMBULATORY_CARE_PROVIDER_SITE_OTHER): Payer: 59 | Admitting: Psychiatry

## 2021-05-10 DIAGNOSIS — Z630 Problems in relationship with spouse or partner: Secondary | ICD-10-CM

## 2021-05-10 DIAGNOSIS — Z8659 Personal history of other mental and behavioral disorders: Secondary | ICD-10-CM | POA: Diagnosis not present

## 2021-05-10 DIAGNOSIS — F319 Bipolar disorder, unspecified: Secondary | ICD-10-CM

## 2021-05-10 DIAGNOSIS — F411 Generalized anxiety disorder: Secondary | ICD-10-CM

## 2021-05-10 NOTE — Progress Notes (Signed)
Psychotherapy Progress Note [facility] [name], PhD LP  Patient ID: Tristan [name]     MRN: [id number] Therapy format: Individual psychotherapy Date: 05/10/2021      Start: 8:12a     Stop: 9:00a     Time Spent: 48 min Location: In-person   Session narrative (presenting needs, interim history, self-report of stressors and symptoms, applications of prior therapy, status changes, and interventions made in session) Neighbor's visiting dog left, took the steam out of the push to confront and ask change.  The rush of childbirth news over there died down, too.   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  Had disappointing results, no sales made.  Also pattern of how, whenever he gets his lowest, [name] pulls back instead of trying to offer empathy.  Compounded further by her getting a wild impulse to move to a fatally ugly, expensive house in [place], and one more time his challenge to deal with "the steamroller".  Had started to fatalize that she was just going to do this and he was going to have to accommodate to keep the peace, but then was able to get her to ask a few questions, and in the process, someone else pt in a bid accepted.    Discussed what it would take to put his foot down more that the house itself was not a good option.  Seems clear that there are times when [place] need both the reality check and to see [name] differ with her in a way that won't be mistaken for rage.  Affirmed that it will require practice speaking up, probably some practice checking perceptions as she is unused to him asserting, and he will need to be able to frame it as the right or healthy thing for both of them, maybe be ready to ask her to consider points.    Therapeutic modalities: Cognitive Behavioral Therapy, Solution-Oriented/Positive Psychology, and Assertiveness/Communication  Mental  Status/Observations:  Appearance:   Casual     Behavior:  Appropriate  Motor:  Normal  Speech/Language:   Clear and Coherent  Affect:  Appropriate  Mood:  dysthymic  Thought process:  normal  Thought content:    WNL  Sensory/Perceptual disturbances:    WNL  Orientation:  Fully oriented  Attention:  Good    Concentration:  Good  Memory:  WNL  Insight:    Good  Judgment:   Good  Impulse Control:  Good   Risk Assessment: Danger to Self: No Self-injurious Behavior: No Danger to Others: No Physical Aggression / Violence: No Duty to Warn: No Access to Firearms a concern: No  Assessment of progress:  progressing  Diagnosis:   ICD-10-CM   1. Generalized anxiety disorder  F41.1     2. Relationship problem between partners  Z63.0     3. Bipolar I disorder (HCC)  F31.9     4. History of posttraumatic stress disorder (PTSD)  Z86.59      Plan:  Check in with [facility] about the house, see if she is willing to talk it over further for practice at constructive conflict  Consider speaking up more assertively with next alarming or disagreeable issue, willing to own that he can't legitimately agree  Other recommendations/advice as may be noted above Continue to utilize previously learned skills ad lib Maintain medication as prescribed and work faithfully with relevant prescriber(s) if any changes are desired  or seem indicated Call the clinic on-call service, 988/hotline, present to ER, or call 911 if any life-threatening psychiatric crisis Return for session(s) already scheduled. Already scheduled visit in this office 05/22/2021.  [name], PhD [name], PhD LP Clinical Psychologist, [facility] [address] [place], [place] [address] [phone]

## 2021-05-22 ENCOUNTER — Ambulatory Visit (INDEPENDENT_AMBULATORY_CARE_PROVIDER_SITE_OTHER): Payer: 59 | Admitting: Psychiatry

## 2021-05-22 ENCOUNTER — Other Ambulatory Visit: Payer: Self-pay

## 2021-05-22 DIAGNOSIS — F319 Bipolar disorder, unspecified: Secondary | ICD-10-CM | POA: Diagnosis not present

## 2021-05-22 DIAGNOSIS — Z630 Problems in relationship with spouse or partner: Secondary | ICD-10-CM

## 2021-05-22 DIAGNOSIS — F411 Generalized anxiety disorder: Secondary | ICD-10-CM

## 2021-05-22 DIAGNOSIS — Z8659 Personal history of other mental and behavioral disorders: Secondary | ICD-10-CM | POA: Diagnosis not present

## 2021-05-22 NOTE — Progress Notes (Signed)
Psychotherapy Progress Note [facility] [name], PhD LP  Patient ID: Tristan [name])    MRN: [id number] Therapy format: Individual psychotherapy Date: 05/22/2021      Start: 9:19a     Stop: 10:07a     Time Spent: 48 min Location: In-person   Session narrative (presenting needs, interim history, self-report of stressors and symptoms, applications of prior therapy, status changes, and interventions made in session) Busy October, which he hopes will bring him out of September doldrums.  Has new shows coming, and [name] ("the [employer]") has moved on to what seems to be more workaholism and no further interest -- thankfully -- in the ugly house.  Says he has tried to ask for attention and been rebuffed, made it somewhat demanding.  C/o [name] making unilateral plans and compulsively having time for other people, and did voice it enough for it to be recognized.  Has made a strategic shift from arranging to go together to flea markets on Saturdays to going on his own and just fielding it if/when [name] asks and "invites herself".  By now pattern of her inviting herself along and ad libbing plans enough to spoil valuable opportunities to find toys that feed his art projects, when the alleged purpose of going together is to support him and his work and get some together time like he has wanted.  Sunday morning faced an additional issue with [name] spying people in a store that she desperately felt she had to avoid, leaving abruptly, and [name] feeling no choice but to abandon his shopping and go out with her.    Support provided, and reviewed choices to roll over and let things happen without bringing them up, with risk of inflaming his own resentment instead of reaching more reasonable understandings together.  Brainstormed whether it would be OK with a do-over to kindly let her leave but not rush himself out, and whether he could broach it with her as a policy question  looking forward, whether she would be OK with making her own exit and emotional safety while letting him finish the errand.  Discussed motivational tactics for depressed times -- committing to a little something, reframing as gift he can give his own tomorrow.  Further stressed with [name]'s birthday coming up.  Feels out of touch with what she wants, plus this year has meant a series of occasions where she "checked out" on him -- his birthday, Valentine's, anniversary, last Christmas before that.  Doesn't want to settle for returning neglect for neglect, but does feel burned by the disregard.  Clarified values, settled on still doing something recognizably thoughtful and working up to being ready to ask her what policy she'd like to have, since a pattern has been developing.  Therapeutic modalities: Cognitive Behavioral Therapy, Solution-Oriented/Positive Psychology, Ego-Supportive, and Assertiveness/Communication  Mental Status/Observations:  Appearance:   Casual     Behavior:  Appropriate  Motor:  Normal  Speech/Language:   Clear and Coherent  Affect:  Appropriate  Mood:  frustrated  Thought process:  normal  Thought content:    WNL  Sensory/Perceptual disturbances:    WNL  Orientation:  Fully oriented  Attention:  Good    Concentration:  Good  Memory:  WNL  Insight:    Good  Judgment:   Good  Impulse Control:  Good   Risk Assessment: Danger to Self: No Self-injurious Behavior: No Danger to Others: No Physical Aggression / Violence: No Duty to Warn: No Access to Firearms  a concern: No  Assessment of progress:  progressing  Diagnosis:   ICD-10-CM   1. Generalized anxiety disorder  F41.1     2. Relationship problem between partners  Z63.0     3. Bipolar I disorder (HCC)  F31.9     4. History of posttraumatic stress disorder (PTSD)  Z86.59      Plan:  Have policy-making discussion about stores and avoiding people Practice readiness to supportively confront "steamroller"  phenomenon, joining in then taking over Other recommendations/advice as may be noted above Continue to utilize previously learned skills ad lib Maintain medication as prescribed and work faithfully with relevant prescriber(s) if any changes are desired or seem indicated Call the clinic on-call service, 988/hotline, present to ER, or call 911 if any life-threatening psychiatric crisis No follow-ups on file. Already scheduled visit in this office 06/12/2021.  [name], PhD [name], PhD LP Clinical Psychologist, [facility] [address] [name], [place] [address] [phone]

## 2021-06-12 ENCOUNTER — Ambulatory Visit: Payer: 59 | Admitting: Psychiatry

## 2021-06-12 ENCOUNTER — Other Ambulatory Visit: Payer: Self-pay

## 2021-06-12 DIAGNOSIS — F319 Bipolar disorder, unspecified: Secondary | ICD-10-CM | POA: Diagnosis not present

## 2021-06-12 DIAGNOSIS — F411 Generalized anxiety disorder: Secondary | ICD-10-CM | POA: Diagnosis not present

## 2021-06-12 DIAGNOSIS — Z8659 Personal history of other mental and behavioral disorders: Secondary | ICD-10-CM | POA: Diagnosis not present

## 2021-06-12 DIAGNOSIS — Z630 Problems in relationship with spouse or partner: Secondary | ICD-10-CM | POA: Diagnosis not present

## 2021-06-12 NOTE — Progress Notes (Signed)
Psychotherapy Progress Note [facility] [name], PhD LP  Patient ID: Tristan [name])    MRN: [id number] Therapy format: Individual psychotherapy Date: 06/12/2021      Start: 9:09a     Stop: 9:58a     Time Spent: 49 min Location: In-person   Session narrative (presenting needs, interim history, self-report of stressors and symptoms, applications of prior therapy, status changes, and interventions made in session) Enjoyed Tristan [name] concert with Tristan [name].  Jury duty the other day, not called, sat grounded for a day.  Feeling more resigned to Tristan [name] not spending so much time.  Did address with her the instance of beating a retreat from stores if she spies someone she wants to avoid, and she indicated she would want him to come out with her for moral support.  Encouraged follow up the discussion more specifically with whether she could abide it if he stays and completes the errands they came for, in order not to sacrifice the time.  Maybe could.  Tristan [name] coming, and typically Tristan [name] spends weeks talking up big preparations, then winds up stressing herself tremendously in the act of Tristan [name] day, becoming overly frustrated and taking it out on the kitchen and the atmosphere.  Had some preliminary discussion of terms for this year -- not rushing to put up Xmas beforehand, having her shopping taken care of early enough to not to be in a rush for th feat preparations she commits to making, plus going through the kitchen early enough to have things where she can use them and not rush or confound herself on cooking day.  Although he started the discussion more harshly than he wanted, was able to walk it back, show empathy, get some listening.  Sees Tristan [name] reckoning a bit more soberly with her own tendency to overcommit, finding her limits.  Tentatively hopeful.  Meanwhile, she is actively involved in neighborhood association and an effort to hold the school board accountable for  building plans nearby.  Says she is angered by the lack of consideration for traffic, but suspects her reasons have more to do with encountering children in the wake of the abortion.  Other encouraging word that she now feels the [facility] house would have been a mistake.    The season still tends to mean considerable anxiety and fodder for depression, with memories of spoiled holidays in his FOO and mounting tensions over expectations and Tristan [name]'s tendency to overdrive herself through birthdays, anniversary, Thanksgiving, and Christmas.  Therapeutic modalities: Cognitive Behavioral Therapy, Solution-Oriented/Positive Psychology, Ego-Supportive, and Assertiveness/Communication  Mental Status/Observations:  Appearance:   Casual     Behavior:  Appropriate  Motor:  Normal  Speech/Language:   Clear and Coherent  Affect:  Appropriate  Mood:  anxious  Thought process:  normal  Thought content:    WNL  Sensory/Perceptual disturbances:    WNL  Orientation:  Fully oriented  Attention:  Good    Concentration:  Good  Memory:  WNL  Insight:    Good  Judgment:   Good  Impulse Control:  Good   Risk Assessment: Danger to Self: No Self-injurious Behavior: No Danger to Others: No Physical Aggression / Violence: No Duty to Warn: No Access to Firearms a concern: No  Assessment of progress:  progressing  Diagnosis:   ICD-10-CM   1. Generalized anxiety disorder  F41.1     2. Relationship problem between partners  Z63.0     3. Bipolar I disorder (HCC)  F31.9  4. History of posttraumatic stress disorder (PTSD)  Z86.59      Plan:  Further the policy-making conversation with Tristan [name] about avoiding people in public Keep working up to be more forthcoming about Tristan [name], drilling" experiences Other recommendations/advice as may be noted above Continue to utilize previously learned skills ad lib Maintain medication as prescribed and work faithfully with relevant prescriber(s) if any changes are desired  or seem indicated Call the clinic on-call service, 988/hotline, present to ER, or call 911 if any life-threatening psychiatric crisis Return for session(s) already scheduled. Already scheduled visit in this Tristan 07/02/2021.  [name], PhD [name], PhD LP Clinical Psychologist, [facility] [address] [place], [place] [address] [phone]

## 2021-06-13 ENCOUNTER — Other Ambulatory Visit: Payer: Self-pay | Admitting: Physician Assistant

## 2021-06-26 ENCOUNTER — Other Ambulatory Visit: Payer: Self-pay | Admitting: Physician Assistant

## 2021-07-02 ENCOUNTER — Other Ambulatory Visit: Payer: Self-pay

## 2021-07-02 ENCOUNTER — Ambulatory Visit (INDEPENDENT_AMBULATORY_CARE_PROVIDER_SITE_OTHER): Payer: 59 | Admitting: Psychiatry

## 2021-07-02 DIAGNOSIS — F411 Generalized anxiety disorder: Secondary | ICD-10-CM

## 2021-07-02 DIAGNOSIS — F319 Bipolar disorder, unspecified: Secondary | ICD-10-CM | POA: Diagnosis not present

## 2021-07-02 DIAGNOSIS — Z630 Problems in relationship with spouse or partner: Secondary | ICD-10-CM

## 2021-07-02 DIAGNOSIS — Z8659 Personal history of other mental and behavioral disorders: Secondary | ICD-10-CM | POA: Diagnosis not present

## 2021-07-02 DIAGNOSIS — Z9189 Other specified personal risk factors, not elsewhere classified: Secondary | ICD-10-CM

## 2021-07-02 NOTE — Progress Notes (Signed)
Psychotherapy Progress Note [facility] [name], PhD LP  Patient ID: Tristan [name])    MRN: [id number] Therapy format: Individual psychotherapy Date: 07/02/2021      Start: 8:11a     Stop: 8:57a     Time Spent: 46 min Location: In-person   Session narrative (presenting needs, interim history, self-report of stressors and symptoms, applications of prior therapy, status changes, and interventions made in session) Eventful.  Tristan [name] continues to live out of balance in various ways, by his report.  Enjoy yoga class but various disruptions including a hoarder lady who bring a very large bag of stuff and has crowded out spots in the room.  Tristan [name] had a recent emergency meltdown episode over newest dog Sadie having an orthopedic injury and emergency vet service, flooded with her own PTSD and contradicting the trusted vet with "advice" from the rescue organization, and cogently declaring how her being so wound up wasn't helping anyone and she needed to get away, with Tristan [name] clearly consenting b/c it is inordinately harder dealing with her nerves on top of the dog's issues.  Several days of fretting led to another vet visit, with Tristan [name] insisting on being involved and getting contradictory vet advice, all of it putting her through the wringer of guilt from dogs and abortion past.  On the weekend, Tristan [name] invited herself along for a trip to [place] (toy show, flea market, and social add-ons) and started a sink/vanity reno project, vastly complicating time and expectations.  Clear that he cannot tell her no when she's frantic or highly interested, even if he can see disaster coming.  Next day declined to go on the toy trip, led to some revelation what he really wants out of those trips and how together interferes.  Discussed tactics for helping Tristan [name] hear without catastrophizing and reacting.  Discussed Thanksgiving upcoming, and progress made getting her agreement to work ahead  on organizing so she doesn't get overwrought.  Meanwhile, Tristan [name] have come back a little .  Attributes to holiday season and birthday approaching, which evoke both bad times in [facility] and focus on multiple frustrations getting Tristan [name] life to be truly comfortable.  Therapeutic modalities: Cognitive Behavioral Therapy, Solution-Oriented/Positive Psychology, and Assertiveness/Communication  Mental Status/Observations:  Appearance:   Casual     Behavior:  Appropriate  Motor:  Normal  Speech/Language:   Clear and Coherent  Affect:  Appropriate  Mood:  anxious and dysthymic  Thought process:  normal  Thought content:    WNL  Sensory/Perceptual disturbances:    WNL  Orientation:  Fully oriented  Attention:  Good    Concentration:  Good  Memory:  WNL  Insight:    Good  Judgment:   Good  Impulse Control:  Good   Risk Assessment: Danger to Self: No Self-injurious Behavior: No Danger to Others: No Physical Aggression / Violence: No Duty to Warn: No Access to Firearms a concern: No  Assessment of progress:  progressing  Diagnosis:   ICD-10-CM   1. Generalized anxiety disorder  F41.1     2. Relationship problem between partners  Z63.0     3. Bipolar I disorder (HCC)  F31.9     4. History of posttraumatic stress disorder (PTSD)  Z86.59     5. At risk for dental problems  Z91.89      Plan:  Communication tips with Tristan [name], still OK to ask her to recognize how things don't work the way she thinks/hopes and be willing to be  more forthcoming outside the moment of stress Follow through with Thanksgiving anti-stress plans Other recommendations/advice as may be noted above Continue to utilize previously learned skills ad lib Maintain medication as prescribed and work faithfully with relevant prescriber(s) if any changes are desired or seem indicated Call the clinic on-call service, 988/hotline, 911, or present to [facility] or ER if any life-threatening psychiatric crisis Return for session(s) already  scheduled. Already scheduled visit in this office 07/16/2021.  [name], PhD [name], PhD LP Clinical Psychologist, [facility] [address] [place], [place] [address] [phone]

## 2021-07-16 ENCOUNTER — Other Ambulatory Visit: Payer: Self-pay

## 2021-07-16 ENCOUNTER — Ambulatory Visit (INDEPENDENT_AMBULATORY_CARE_PROVIDER_SITE_OTHER): Payer: 59 | Admitting: Psychiatry

## 2021-07-16 DIAGNOSIS — F319 Bipolar disorder, unspecified: Secondary | ICD-10-CM | POA: Diagnosis not present

## 2021-07-16 DIAGNOSIS — Z8659 Personal history of other mental and behavioral disorders: Secondary | ICD-10-CM | POA: Diagnosis not present

## 2021-07-16 DIAGNOSIS — Z630 Problems in relationship with spouse or partner: Secondary | ICD-10-CM

## 2021-07-16 DIAGNOSIS — F411 Generalized anxiety disorder: Secondary | ICD-10-CM | POA: Diagnosis not present

## 2021-07-16 NOTE — Progress Notes (Signed)
Psychotherapy Progress Note [facility] [name], PhD LP  Patient ID: Tristan [name])    MRN: [id number] Therapy format: Individual psychotherapy Date: 07/16/2021      Start: 8:14a     Stop: 8:59a     Time Spent: 45 min Location: In-person   Session narrative (presenting needs, interim history, self-report of stressors and symptoms, applications of prior therapy, status changes, and interventions made in session) Anticipated a non-event for his birthday last week, accepted a black male friend's invitation to see the [employer], came home to initial surprise preparations from [place] which only amounted to going out to dinner, her being uncomfortable and rushed (presence of kids, anxiety about Thanksgiving), then tried to oversell how good it was after home.  Wed wound up with [name] pressing a lot of house and yard work, when he figured he'll need some left for Thursday, and a lot of nervous itinerary-checking by her.   [name] exited the gathering early Thursday, having worked herself hard, earlier discussion how all the extra work was to give [name] a holiday, not clear whether it got to the point of deciding it's not so necessary.  Along the way, some progress is engaging [name] about what does and does not have to be worried about.  Today, she has them looking at houses again, figuring that she is phobic about the school coming into their neighborhood, along with a few practical and aesthetic advantages.  Reviewed understandings about [name]'s motivations, concluded likely she is trying to avoid surprise reminders of children, what could have been with her own pregnancy, and guilt associated with the abortion (and perhaps many other things in life), as well as escalated anxiety about the aggressive dog that sometimes visits next door.  Evaluated prospects and encouraged for drawing her into a more frank discussion of why she feels as driven as she does,  reframing fears of "making" her uncomfortable and setting off conflict and framing it as love and good practice for her to find out it's OK to say what she feels, fears, wants and it will ultimately be both more intimate and more practical for both of them to have it out on the table if her fear and guilt are so strong right now. Affirmed positive experience seeing sister and family  Re. meds, had reduced lamotrigine early summer, reinstated since then.    Therapeutic modalities: Cognitive Behavioral Therapy, Solution-Oriented/Positive Psychology, Insight-Oriented, and Assertiveness/Communication  Mental Status/Observations:  Appearance:   Casual     Behavior:  Appropriate  Motor:  Normal  Speech/Language:   Clear and Coherent  Affect:  Appropriate  Mood:  anxious  Thought process:  normal  Thought content:    WNL  Sensory/Perceptual disturbances:    WNL  Orientation:  Fully oriented  Attention:  Good    Concentration:  Good  Memory:  WNL  Insight:    Good  Judgment:   Good  Impulse Control:  Good   Risk Assessment: Danger to Self: No Self-injurious Behavior: No Danger to Others: No Physical Aggression / Violence: No Duty to Warn: No Access to Firearms a concern: No  Assessment of progress:  progressing  Diagnosis: No diagnosis found. Plan:  Perception-checking conversation recommended with [name], self-affirm that it is love that asks, and all love is trying to do is attune to what matters to her -- can still collaborate on what to do with it once both know better what's driving her Other recommendations/advice as may  be noted above Continue to utilize previously learned skills ad lib Maintain medication as prescribed and work faithfully with relevant prescriber(s) if any changes are desired or seem indicated Call the clinic on-call service, 988/hotline, 911, or present to [facility] or ER if any life-threatening psychiatric crisis No follow-ups on file. Already scheduled visit  in this office 07/17/2021.  [name], PhD [name], PhD LP Clinical Psychologist, [facility] [address] [place], [place] [address] [phone]

## 2021-07-17 ENCOUNTER — Ambulatory Visit (INDEPENDENT_AMBULATORY_CARE_PROVIDER_SITE_OTHER): Payer: 59 | Admitting: Physician Assistant

## 2021-07-17 ENCOUNTER — Encounter: Payer: Self-pay | Admitting: Physician Assistant

## 2021-07-17 DIAGNOSIS — G4700 Insomnia, unspecified: Secondary | ICD-10-CM

## 2021-07-17 DIAGNOSIS — F411 Generalized anxiety disorder: Secondary | ICD-10-CM | POA: Diagnosis not present

## 2021-07-17 DIAGNOSIS — F3132 Bipolar disorder, current episode depressed, moderate: Secondary | ICD-10-CM

## 2021-07-17 MED ORDER — BUSPIRONE HCL 15 MG PO TABS: ORAL_TABLET | ORAL | 3 refills | Status: DC

## 2021-07-17 NOTE — Progress Notes (Signed)
[facility]  Patient ID: REX OESTERLE,  MRN: [id number]  PCP: Patient, No Pcp Per (Inactive)  Date of Evaluation: 07/17/2021  time spent:20 minutes  Chief Complaint:  Chief Complaint   Anxiety; Depression; Insomnia; Follow-up      HISTORY/CURRENT STATUS: HPI For routine med check  Got more depressed about a month ago. He gradually increased the Lamictal, has only been on that dose for about 2-3 weeks.  He can already tell that he feels some better.  He was just feeling more blue than he had been.  As he was thinking about yesterday he realized at least in part it was due to the time change getting dark earlier, not exercising like he had been because he would always do that after dinner.  He is not isolating.  No suicidal or homicidal thoughts.  He does have obsessions about lack of work, and especially when he wakes up during the night he cannot go back to sleep because of that.  He does not have a lot of problems going to sleep but staying asleep is the issue.  He does get 6-1/2 hours of sleep per night on average but still is tired the next day and will often have to take a nap.  The hydroxyzine does help him get to sleep but he is unable to take the [facility] for mid nocturnal awakening because he does not have enough time left to sleep when he wakes up.  Needs to get up about 7 and usually wakes up around 4-ish and cannot go back to sleep.  Patient denies increased energy with decreased need for sleep, no increased talkativeness, no racing thoughts, no impulsivity or risky behaviors, no increased spending, no increased libido, no grandiosity, no increased irritability or anger, no paranoia, and no hallucinations.  Denies dizziness, syncope, seizures, numbness, tingling, tremor, tics, unsteady gait, slurred speech, confusion. Denies muscle or joint pain, stiffness, or dystonia.  Individual Medical History/ Review of Systems: Changes? :No   Past medications for mental  health diagnoses include: Celexa, Lamictal, Lexapro, Seroquel, prazosin, Trileptal, trazodone, Vistaril, Sonata.   Allergies: Patient has no known allergies.  Current Medications:  Current Outpatient Medications:    diazepam (VALIUM) 5 MG tablet, Take 1 tablet (5 mg total) by mouth every 8 (eight) hours as needed for anxiety., Disp: 90 tablet, Rfl: 0   hydrOXYzine (ATARAX/VISTARIL) 25 MG tablet, Take 1-2 tablets (25-50 mg total) by mouth every 6 (six) hours as needed for anxiety. (Patient taking differently: Take 25-50 mg by mouth daily as needed for anxiety.), Disp: 240 tablet, Rfl: 5   lamoTRIgine (LAMICTAL) 150 MG tablet, Take 0.5 tablets (75 mg total) by mouth 2 (two) times daily. (Patient taking differently: Take 150 mg by mouth 2 (two) times daily.), Disp: 180 tablet, Rfl: 3   oxcarbazepine (TRILEPTAL) 600 MG tablet, Take 1 tablet (600 mg total) by mouth 2 (two) times daily., Disp: 180 tablet, Rfl: 3   zaleplon (SONATA) 10 MG capsule, TAKE 1 CAPSULE AT BEDTIME AS NEEDED FOR SLEEP (MAY REPEAT 1 CAPSULE IF HAS 3 HOURS LEFT TO SLEEP), Disp: 60 capsule, Rfl: 1   busPIRone (BUSPAR) 15 MG tablet, 3 po q am, 2 po qhs, Disp: 450 tablet, Rfl: 3 Medication Side Effects: none  Family Medical/ Social History: Changes? No   MENTAL HEALTH EXAM:  There were no vitals taken for this visit.There is no height or weight on file to calculate BMI.  General Appearance: Casual, Neat and Well Groomed  Eye  Contact:  Good  Speech:  Clear and Coherent and Normal Rate  Volume:  Normal  Mood:  Euthymic  Affect:  Appropriate  Thought Process:  Goal Directed and Descriptions of Associations: Circumstantial  Orientation:  Full (Time, Place, and Person)  Thought Content: Logical   Suicidal Thoughts:  No  Homicidal Thoughts:  No  Memory:  WNL  Judgement:  Good  Insight:  Good  Psychomotor Activity:  Normal  Concentration:  Concentration: Good and Attention Span: Good  Recall:  Good  Fund of Knowledge: Good   Language: Good  Assets:  Desire for Improvement  ADL's:  Intact  Cognition: WNL  Prognosis:  Good    DIAGNOSES:    ICD-10-CM   1. Bipolar affective disorder, currently depressed, moderate (HCC)  F31.32     2. Generalized anxiety disorder  F41.1     3. Insomnia, unspecified type  G47.00         Receiving Psychotherapy: Yes  Dr. Mardelle Matte Mitchum   RECOMMENDATIONS:  PDMP was unable to be accessed. I provided 20 minutes of face to face time during this encounter, including time spent before and after the visit in records review, medical decision making, counseling pertinent to today's visit, and charting.  Recommend mood therapy lamp.  Try to get exercise in some other way been waiting till after dinner. May need to increase Lamictal, if he is not showing even more improvement when being on this current dose for 4 to 6 weeks he will let me know and we can increase over the phone. Take Valium instead of Sonata as needed for sleep.  He rarely takes it so I think it would be beneficial.  No other changes will be made. Continue Lamictal 150 mg, 1 po bid.  Continue Buspar 15 mg, 3 in am and 2 po qhs.. Cont Valium 5 mg po tid prn. Cont Hydroxyzine 25 mg, 1-2 po tid prn.  Continue Trileptal 600, 1 po bid. Cont Sonata 10 mg qhs prn sleep.  Cont therapy with Dr. [name]. Return in 3 months  Melony Overly, [place]

## 2021-07-29 ENCOUNTER — Other Ambulatory Visit: Payer: Self-pay

## 2021-07-29 ENCOUNTER — Ambulatory Visit (INDEPENDENT_AMBULATORY_CARE_PROVIDER_SITE_OTHER): Payer: 59 | Admitting: Psychiatry

## 2021-07-29 DIAGNOSIS — Z8659 Personal history of other mental and behavioral disorders: Secondary | ICD-10-CM

## 2021-07-29 DIAGNOSIS — Z630 Problems in relationship with spouse or partner: Secondary | ICD-10-CM | POA: Diagnosis not present

## 2021-07-29 DIAGNOSIS — F319 Bipolar disorder, unspecified: Secondary | ICD-10-CM

## 2021-07-29 DIAGNOSIS — F411 Generalized anxiety disorder: Secondary | ICD-10-CM

## 2021-07-29 DIAGNOSIS — Z634 Disappearance and death of family member: Secondary | ICD-10-CM | POA: Diagnosis not present

## 2021-07-29 NOTE — Progress Notes (Signed)
Psychotherapy Progress Note [facility] [name], PhD LP  Patient ID: Tristan [name])    MRN: [id number] Therapy format: Individual psychotherapy Date: 07/29/2021      Start: 9:12a     Stop: 10:01a     Time Spent: 49 min Location: In-person   Session narrative (presenting needs, interim history, self-report of stressors and symptoms, applications of prior therapy, status changes, and interventions made in session) Getting over a mystery virus.  Has broached it to [facility] to he doesn't want to do much for Xmas this year, too reminded of Gertie's death a year ago.  Was trying to suffer privately, but vet visit for another dog's vet appointment meant running smack into a picture that looked uncannily like [place], too emotional to contain.  Got understanding about it and negotiated a reduced-expectations holiday plan.    In understanding [place], had thought her agitation was all about abortion anniversary and Patricia [place] of being forced out of her job, but recently reminded of her mother's last grand manipulation and the huge mixed feelings about trying to relate again to her siblings after a lifetime of favoritism and manipulation by mother and seeing siblings enabling the marginalization she went through.  Email from her sister Sunday, framed as from the family, inviting them to a celebration of life, set off a lot of anger in [place] that threatened to get him mad, too, both on her behalf and as an excuse to indulge and divulge all his other frustrations and grief.  Helpful discovery this morning of the concept of scapegoating in family systems, sees [name] as the victim of that.  Says [name] is intermittently aware that she is putting out negativity, which is more hopeful of her reining in compulsions, too.Marland Kitchen  Affirmed the progress, that they are connecting better, she is sharing better, and he is better mindful of what eats her.  Still dealing with feeling spread thin  himself, and still too much distance between them.  Encouraged to always be ready with empathy and seek to turn advice he would want to give into thought-provoking questions, especially if they get her to see when she is trying too hard and truly doesn't have to convince him or fix feelings of his that she can't.  Glad to see that [name] did finish changing the doorknobs, knocking off a longterm eyesore in the house.  And they got a plumber to finish the bathroom project.  Also, he sold a high-dollar piece and got some good exposure recently for his work.  Affirmed and encouraged.  Therapeutic modalities: Cognitive Behavioral Therapy and Solution-Oriented/Positive Psychology  Mental Status/Observations:  Appearance:   Casual     Behavior:  Appropriate  Motor:  Normal  Speech/Language:   Clear and Coherent  Affect:  Appropriate  Mood:  dysthymic and grieved  Thought process:  normal  Thought content:    WNL  Sensory/Perceptual disturbances:    WNL  Orientation:  Fully oriented  Attention:  Good    Concentration:  Good  Memory:  WNL  Insight:    Good  Judgment:   Good  Impulse Control:  Good   Risk Assessment: Danger to Self: No Self-injurious Behavior: No Danger to Others: No Physical Aggression / Violence: No Duty to Warn: No Access to Firearms a concern: No  Assessment of progress:  progressing  Diagnosis:   ICD-10-CM   1. Generalized anxiety disorder  F41.1     2. Bereavement  Z63.4  3. Relationship problem between partners  Z63.0     4. Bipolar I disorder (HCC)  F31.9     5. History of posttraumatic stress disorder (PTSD)  Z86.59      Plan:  Continue to nurture sensitive exchanges between himself and [place].  Thank or affirm insights of her own into how she is coming across and what her motives might be for compulsive behavior. Continuing endorsement to bring it up if there is anything he wants different from the "steamroller" he fears she will be -- try her out  for hearing his point of view and wishes Other recommendations/advice as may be noted above Continue to utilize previously learned skills ad lib Maintain medication as prescribed and work faithfully with relevant prescriber(s) if any changes are desired or seem indicated Call the clinic on-call service, 988/hotline, 911, or present to [facility] or ER if any life-threatening psychiatric crisis Return for session(s) already scheduled. Already scheduled visit in this office 08/14/2021.  [name], PhD [name], PhD LP Clinical Psychologist, [facility] [address] [place], [place] [address] [phone]

## 2021-08-14 ENCOUNTER — Ambulatory Visit (INDEPENDENT_AMBULATORY_CARE_PROVIDER_SITE_OTHER): Payer: 59 | Admitting: Psychiatry

## 2021-08-14 ENCOUNTER — Other Ambulatory Visit: Payer: Self-pay

## 2021-08-14 DIAGNOSIS — Z8659 Personal history of other mental and behavioral disorders: Secondary | ICD-10-CM

## 2021-08-14 DIAGNOSIS — F319 Bipolar disorder, unspecified: Secondary | ICD-10-CM

## 2021-08-14 DIAGNOSIS — F411 Generalized anxiety disorder: Secondary | ICD-10-CM

## 2021-08-14 DIAGNOSIS — Z630 Problems in relationship with spouse or partner: Secondary | ICD-10-CM | POA: Diagnosis not present

## 2021-08-14 DIAGNOSIS — Z634 Disappearance and death of family member: Secondary | ICD-10-CM

## 2021-08-14 NOTE — Progress Notes (Signed)
Psychotherapy Progress Note [facility] Tristan Czar, PhD LP  Patient ID: Tristan [name])    MRN: [id number] Therapy format: Individual psychotherapy Date: 08/14/2021      Start: 9:15a     Stop: 10:05a     Time Spent: 50 min Location: In-person   Session narrative (presenting needs, interim history, self-report of stressors and symptoms, applications of prior therapy, status changes, and interventions made in session) Sees Tristan [name] maybe calming some about her family distress.  Has seen a piece of his work sell and be featured in a [employer], [employer], been working on a couple of new art pieces, and uncharacteristically shared some work in progress with close circle, has an $800 piece sold before it's finished, first time.  Got validating word, and a video, from a set of parents who bought a piece of his in [place], showing their 18yo son opening, loving it, saying thank you to them b/c he fell in love with the piece at a show.  Affirmed and encouraged.   Tristan [name] has been down enough, feeling estranged from her family.  Christmas difficult in its own right for their combined estrangement from families and resistance to feeling obligated to manufacture happiness.  Also in the shadow of dog's deaths in the past year, including Tristan [name] a year ago.  With the brutal cold snap last weekend, Tristan [name], the oldest dachshund, had an arthritis attack, sparking Tristan [name]'s fear of his back going out (as that breed does, and as one prior dog did).  Has seen Tristan [name] be los for a week to urgent business, "discover" urgent home repairs (e.g., dishwasher rack adjustment, reworking stereo adjustments) in the urgent mood she gets in, though she has also been making more spontaneous acknowledgments that she panics and throws money (or effort) at problems.  Discussed ways of furthering Tristan [name]'s reckoning with anxious compulsions, largely by being the one on the scene who can (a) remind her that he would rather  have her more immune to obsessions and personally available than have finding the next dog to please him be some urgent project, and (b) ask her the gentle therapeutic question what happens if the thing she is urgent about just won't happen urgently.   Big annual show coming in February.  Cautiously hopeful of getting further recognition and sales out of it.  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  Discussed dealing with Tristan [name], signs of her becoming more verbal and acknowledging her tendencies.  Affirmed as love to be willing to   Therapeutic modalities: Cognitive Behavioral Therapy and Solution-Oriented/Positive Psychology  Mental Status/Observations:  Appearance:   Casual     Behavior:  Appropriate  Motor:  Normal  Speech/Language:   Clear and Coherent  Affect:  Appropriate  Mood:  dysthymic  Thought process:  normal  Thought content:    WNL  Sensory/Perceptual disturbances:    WNL  Orientation:  Fully oriented  Attention:  Good    Concentration:  Good  Memory:  WNL  Insight:    Good  Judgment:   Good  Impulse Control:  Good   Risk Assessment: Danger to Self: No Self-injurious Behavior: No Danger to Others: No Physical Aggression / Violence: No Duty  to Warn: No Access to Firearms a concern: No  Assessment of progress:  progressing  Diagnosis:   ICD-10-CM   1. Generalized anxiety disorder  F41.1     2. Bereavement  Z63.4     3. Relationship problem between partners  Z63.0     4. Bipolar I disorder (HCC)  F31.9     5. History of posttraumatic stress disorder (PTSD)  Z86.59      Plan:  Permission to grieve Keep seeking mutual positive support and listening with Tristan [name] Need to follow up on neglected dental  work Other recommendations/advice as may be noted above Continue to utilize previously learned skills ad lib Maintain medication as prescribed and work faithfully with relevant prescriber(s) if any changes are desired or seem indicated Call the clinic on-call service, 988/hotline, 911, or present to [facility] or ER if any life-threatening psychiatric crisis Return for session(s) already scheduled. Already scheduled visit in this office 09/05/2021.  [name], PhD Tristan Czar, PhD LP Clinical Psychologist, [facility] [address], [address] [place], [place] [address] [phone]

## 2021-09-05 ENCOUNTER — Ambulatory Visit (INDEPENDENT_AMBULATORY_CARE_PROVIDER_SITE_OTHER): Payer: 59 | Admitting: Psychiatry

## 2021-09-05 ENCOUNTER — Other Ambulatory Visit: Payer: Self-pay

## 2021-09-05 DIAGNOSIS — F319 Bipolar disorder, unspecified: Secondary | ICD-10-CM

## 2021-09-05 DIAGNOSIS — F411 Generalized anxiety disorder: Secondary | ICD-10-CM | POA: Diagnosis not present

## 2021-09-05 DIAGNOSIS — Z8659 Personal history of other mental and behavioral disorders: Secondary | ICD-10-CM

## 2021-09-05 DIAGNOSIS — Z630 Problems in relationship with spouse or partner: Secondary | ICD-10-CM

## 2021-09-05 NOTE — Progress Notes (Signed)
Psychotherapy Progress Note [facility] [name], PhD LP  Patient ID: VASHAUN OSMON)    MRN: [id number] Therapy format: Individual psychotherapy Date: 09/05/2021      Start: 1:11p     Stop: 2:01p     Time Spent: 50 min Location: In-person   Session narrative (presenting needs, interim history, self-report of stressors and symptoms, applications of prior therapy, status changes, and interventions made in session) Art show ("[employer]") coming up 2 weeks, eager.  Recent odyssey deciding what to do with some toy parts, visioning a series, struggling with self about breaking open a valuable unopened piece in storage, new local interest in his work, new distant interest in his work, and getting a meeting about making an elevator pitch for something bigger.  Feeling better centered and renewed drive for art work, to the point where he realizes he needs target times, and to where some house needs are suffering a little.  Side saga of a man who was very interested in his work, did an online review while noticeably high, created a flap, had a notable argument whether he was in possession of a piece, made his peace with it.  Did not relapse in old rage over it, noticed temptation to.  Figured he is tensed up largely by [name]'s compulsions, still trying to keep calm about them.  With [place], the housing compulsion has exploded again, with [name] getting wind through a neighbor of a mid-century modern newly available.  Saw it Saturday, lots of problematic choices noted, including an observably bad drainage situation, poor privacy.  [name] sees it as a nonstarter, but [name] has already cleared a loan, made an offer, and is in negotiations.  Had a more forthcoming, empathetic discussion with her, trying to get her to see how she compulsively doubles down on ideas when she's distressed.  [name] has been perpetually worked up with the school Holiday representative issue and a church  that just shut down.  As of today, has let   Encouraged to let betsy know if he is adamant against this house.  Figure on discussion he can bargain for some things, including freedom for him to put up a mural in an interesting outdoor location and that he can insist this time she has to declutter her collection of things to make space work realistically and he can have adequate art space.  Encouraged not to lowball his needs and objections.  Therapeutic modalities: Cognitive Behavioral Therapy and Solution-Oriented/Positive Psychology  Mental Status/Observations:  Appearance:   Casual     Behavior:  Appropriate  Motor:  Normal  Speech/Language:   Clear and Coherent  Affect:  Appropriate  Mood:  anxious and dysthymic  Thought process:  normal  Thought content:    WNL  Sensory/Perceptual disturbances:    WNL  Orientation:  Fully oriented  Attention:  Good    Concentration:  Good  Memory:  WNL  Insight:    Good  Judgment:   Good  Impulse Control:  Good   Risk Assessment: Danger to Self: No Self-injurious Behavior: No Danger to Others: No Physical Aggression / Violence: No Duty to Warn: No Access to Firearms a concern: No  Assessment of progress:  progressing  Diagnosis:   ICD-10-CM   1. Generalized anxiety disorder  F41.1     2. Relationship problem between partners  Z63.0     3. Bipolar I disorder (HCC)  F31.9     4. History of posttraumatic stress disorder (PTSD)  Z86.59      Plan:  Endorsement to be clear about the problems he sees with the house, as well as what he would need to make it functional and fair Continuing endorsement OK to let [name] know about the "steamroller" dynamic -- can't get any worse for her hearing it than it goes working around it.  If so, center up on behavior -- what she does, how it affects, what he'd rather have, how it's better Prefer abstain from [facility], but seems like acceptable amount at this point Need to follow up on neglected dental  work Other recommendations/advice as may be noted above Continue to utilize previously learned skills ad lib Maintain medication as prescribed and work faithfully with relevant prescriber(s) if any changes are desired or seem indicated Call the clinic on-call service, 988/hotline, 911, or present to [facility] or ER if any life-threatening psychiatric crisis Return for session(s) already scheduled. Already scheduled visit in this office 09/19/2021.  [name], PhD [name], PhD LP Clinical Psychologist, [facility] [address] [place], [place] [address] [phone]

## 2021-09-19 ENCOUNTER — Ambulatory Visit (INDEPENDENT_AMBULATORY_CARE_PROVIDER_SITE_OTHER): Payer: 59 | Admitting: Psychiatry

## 2021-09-19 ENCOUNTER — Other Ambulatory Visit: Payer: Self-pay

## 2021-09-19 DIAGNOSIS — Z630 Problems in relationship with spouse or partner: Secondary | ICD-10-CM | POA: Diagnosis not present

## 2021-09-19 DIAGNOSIS — F1210 Cannabis abuse, uncomplicated: Secondary | ICD-10-CM

## 2021-09-19 DIAGNOSIS — F411 Generalized anxiety disorder: Secondary | ICD-10-CM | POA: Diagnosis not present

## 2021-09-19 DIAGNOSIS — Z8659 Personal history of other mental and behavioral disorders: Secondary | ICD-10-CM | POA: Diagnosis not present

## 2021-09-19 DIAGNOSIS — F319 Bipolar disorder, unspecified: Secondary | ICD-10-CM

## 2021-09-19 NOTE — Progress Notes (Signed)
Psychotherapy Progress Note [facility] [name], PhD LP  Patient ID: BREVEN GUIDROZ)    MRN: [id number] Therapy format: Individual psychotherapy Date: 09/19/2021      Start: 1:05p     Stop: 1:55p     Time Spent: 50 min Location: In-person   Session narrative (presenting needs, interim history, self-report of stressors and symptoms, applications of prior therapy, status changes, and interventions made in session) [name]'s househunting saga continued, with [name] upping the offer, and trying to write directly to the seller, in violation of realty protocol, knowingly avoiding professional advice, and ginning up a whole sentimental argument without any idea of how her audience works.  Domestically, he did broach his conditions for accepting the plan to buy and move.  Current status unclear how things will shake out, and unclear whether [name] will take seriously the demand that if they do move there, it is her turn to shed excess belongings to fit the space.  Uneasy, still, but affirmed his efforts to assert and advocate, argued that even if he did not get a yes, he got her to think more about it, and that is some kind of effective, some kind of better than convincing himself he is helpless until he resents her too much to keep his temper and the cycle we've been trying to address for a few years revs up again.  Meanwhile, about to go to the toy show in [place].  Unfulfilled promises from [place] to take care of labels and stickers that would aid marketing, and brainlock on her part about using a local resource who can't do what is actually needed.  Economizing on conflict and effort, he decided autonomously that he doesn't need these things, and it's OK if she fails.  Simultaneously, the issue of getting the dogs taken care of and [name]'s angst leaving them, with the offer made that she can stay home; utterly backfired, as she dug in with her own apparent need not to  have any weaknesses in need of being taken care of, and doubled down on her pledge to go with him to the show.  Not stated that he would rather she back off and just let him do this one unimpeded.  Since then, noticing a lot of stray expressions of anger from her about other things, broadly annoyed with life.  Reads basically like the Thanksgiving dynamic where she makes herself responsible for preparations she can't reasonably fulfill and thrashes and spouts the whole week or more leading up to it.  Still not clear yet to her that the way she's approaching "help" is hurting him, but he is trying to "mantra" himself into stable positivity with a music playlist, and even that only made her uncomfortably inquisitive.  Meanwhile, she invited herself along on a toy shopping trip, stretching an efficient 1.5 hour action plan into over 3 uncomfortable hours.  Add to it, an altercation at their shared yoga class with an objectively high strung, annoying woman, who got [name] obsessing about her, apparently triggering [name]'s professed mother issues.  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    Many things affecting focus and ability to put in time for his craft, and basically had to declare defeat, sleep.  Triggering a mood of helplessness and some self-doubt, sheer silence yesterday with [name].  Allowed to vent. Support/empathy provided.  Briefly discussed ways to broker some autonomy and enjoyment this weekend with the show.  Therapeutic modalities: Cognitive Behavioral Therapy, Solution-Oriented/Positive  Psychology, and Ego-Supportive  Mental Status/Observations:  Appearance:   Casual     Behavior:  Appropriate  Motor:  Normal  Speech/Language:   Clear and Coherent  Affect:  Appropriate  Mood:  dysthymic and more defeated, but energetic at the ludicrousness of it all  Thought process:  normal  Thought content:    WNL  Sensory/Perceptual disturbances:    WNL  Orientation:  Fully oriented  Attention:  Good    Concentration:  Good  Memory:  WNL  Insight:    Good  Judgment:   Good  Impulse Control:  Good   Risk Assessment: Danger to Self: No Self-injurious Behavior: No Danger to Others: No Physical Aggression / Violence: No Duty to Warn: No Access to Firearms a concern: No  Assessment of progress:  stabilized  Diagnosis:   ICD-10-CM   1. Generalized anxiety disorder  F41.1     2. Relationship problem between partners  Z63.0     3. Bipolar I disorder (HCC)  F31.9     4. History of posttraumatic stress disorder (PTSD)  Z86.59     5. Tetrahydrocannabinol (THC) use disorder, mild, abuse  F12.10      Plan:  Continuing endorsement OK to let [name] know about the "steamroller" dynamic -- can't get any worse for her hearing it than it goes working around it.  If so, center up on behavior -- what she does, how it affects, what he'd rather have, how it's better Prefer abstain from [facility], but seems like acceptable amount at this point Need to follow up on neglected dental work Other recommendations/advice as may be noted above Continue to utilize previously learned skills ad lib Maintain medication as prescribed and work faithfully with relevant prescriber(s) if any changes are desired or seem indicated Call the clinic on-call service, 988/hotline, 911, or present to [facility] or ER if any life-threatening psychiatric crisis Return for time as available. Already scheduled visit in this office 10/16/2021.  [name], PhD [name], PhD LP Clinical Psychologist, [facility] [address] [place], [place] [address] [phone]

## 2021-09-24 ENCOUNTER — Other Ambulatory Visit: Payer: Self-pay | Admitting: Physician Assistant

## 2021-10-16 ENCOUNTER — Encounter: Payer: Self-pay | Admitting: Physician Assistant

## 2021-10-16 ENCOUNTER — Ambulatory Visit (INDEPENDENT_AMBULATORY_CARE_PROVIDER_SITE_OTHER): Payer: 59 | Admitting: Physician Assistant

## 2021-10-16 ENCOUNTER — Other Ambulatory Visit: Payer: Self-pay

## 2021-10-16 DIAGNOSIS — F411 Generalized anxiety disorder: Secondary | ICD-10-CM | POA: Diagnosis not present

## 2021-10-16 DIAGNOSIS — F319 Bipolar disorder, unspecified: Secondary | ICD-10-CM

## 2021-10-16 DIAGNOSIS — F4310 Post-traumatic stress disorder, unspecified: Secondary | ICD-10-CM | POA: Diagnosis not present

## 2021-10-16 DIAGNOSIS — F1210 Cannabis abuse, uncomplicated: Secondary | ICD-10-CM | POA: Diagnosis not present

## 2021-10-16 MED ORDER — LAMOTRIGINE 150 MG PO TABS: 150.0000 mg | ORAL_TABLET | Freq: Two times a day (BID) | ORAL | 3 refills | Status: DC

## 2021-10-16 MED ORDER — DIAZEPAM 5 MG PO TABS: 5.0000 mg | ORAL_TABLET | Freq: Three times a day (TID) | ORAL | 5 refills | Status: DC | PRN

## 2021-10-16 MED ORDER — ZALEPLON 10 MG PO CAPS: ORAL_CAPSULE | ORAL | 1 refills | Status: DC

## 2021-10-16 MED ORDER — HYDROXYZINE HCL 25 MG PO TABS: 25.0000 mg | ORAL_TABLET | Freq: Four times a day (QID) | ORAL | 5 refills | Status: DC | PRN

## 2021-10-16 NOTE — Progress Notes (Signed)
[facility] ? ?Patient ID: Tristan [name],  ?MRN: [id number] ? ?PCP: Patient, No Pcp Per (Inactive) ? ?Date of Evaluation: 10/16/2021 ?time spent:20 minutes ? ?Chief Complaint:  ?Chief Complaint   ?Anxiety; Depression; Insomnia; Follow-up ?  ? ? ? ?HISTORY/CURRENT STATUS: ?HPI For routine med check ? ?Had a big show in [place] a month or so ago, and had been told that another artist was going to buy one of his big pieces that was worth around $900.  Then during the show the guy backed out which was disappointing and very frustrating because he waited to do it in front of other people.  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  That is exciting and fingers crossed it will work out well. ? ?He feels like his medications are working well.  Able to enjoy things, especially now that spring is coming and he has been able to get out with the recent warm weather on sunny days.  That always makes him feel better.  Energy and motivation are good.  Appetite is normal and weight is stable.  ADLs and personal hygiene are normal.  Not isolating.  Still has trouble sleeping, goes to sleep fine but wakes up during the middle of the night often.  His wife also has trouble sleeping and he is not sure if her restlessness wakes him up or not.  He hardly ever uses the Sonata in the middle of the night because he does not really have long enough to sleep after taking it.  No suicidal or homicidal thoughts. ? ?Still suffers from anxiety.  Not usually having panic attacks but more of a generalized sense of unease at times.  The Valium is effective. ? ?Patient denies increased energy with decreased need for sleep, no increased talkativeness, no racing thoughts, no impulsivity or risky behaviors, no increased spending, no increased libido, no grandiosity, no increased irritability or anger, no paranoia, and no hallucinations. ? ?Denies dizziness, syncope, seizures,  numbness, tingling, tremor, tics, unsteady gait, slurred speech, confusion. Denies muscle or joint pain, stiffness, or dystonia. ? ?Individual Medical History/ Review of Systems: Changes? :No  ? ?Past medications for mental health diagnoses include: ?Celexa, Lamictal, Lexapro, Seroquel, prazosin, Trileptal, trazodone, Vistaril, Sonata.  ? ?Allergies: Patient has no known allergies. ? ?Current Medications:  ?Current Outpatient Medications:  ?  busPIRone (BUSPAR) 15 MG tablet, 3 po q am, 2 po qhs, Disp: 450 tablet, Rfl: 3 ?  oxcarbazepine (TRILEPTAL) 600 MG tablet, Take 1 tablet (600 mg total) by mouth 2 (two) times daily., Disp: 180 tablet, Rfl: 3 ?  diazepam (VALIUM) 5 MG tablet, Take 1 tablet (5 mg total) by mouth every 8 (eight) hours as needed. for anxiety, Disp: 90 tablet, Rfl: 5 ?  hydrOXYzine (ATARAX) 25 MG tablet, Take 1-2 tablets (25-50 mg total) by mouth every 6 (six) hours as needed for anxiety., Disp: 240 tablet, Rfl: 5 ?  lamoTRIgine (LAMICTAL) 150 MG tablet, Take 1 tablet (150 mg total) by mouth 2 (two) times daily., Disp: 180 tablet, Rfl: 3 ?  zaleplon (SONATA) 10 MG capsule, 1 po qhs prn and may repeat 1 for midnocturnal awakening if has 3 hours left to sleep., Disp: 60 capsule, Rfl: 1 ?Medication Side Effects: none ? ?Family Medical/ Social History: Changes? No ?  ?MENTAL HEALTH EXAM: ? ?There were no vitals taken for this visit.There is no height or weight on file to calculate BMI.  ?General  Appearance: Casual, Neat and Well Groomed  ?Eye Contact:  Good  ?Speech:  Clear and Coherent and Normal Rate  ?Volume:  Normal  ?Mood:  Euthymic  ?Affect:  Appropriate  ?Thought Process:  Goal Directed and Descriptions of Associations: Circumstantial  ?Orientation:  Full (Time, Place, and Person)  ?Thought Content: Logical   ?Suicidal Thoughts:  No  ?Homicidal Thoughts:  No  ?Memory:  WNL  ?Judgement:  Good  ?Insight:  Good  ?Psychomotor Activity:  Normal  ?Concentration:  Concentration: Good and Attention Span:  Good  ?Recall:  Good  ?Fund of Knowledge: Good  ?Language: Good  ?Assets:  Desire for Improvement ?Financial Resources/Insurance ?Housing ?Talents/Skills ?Transportation  ?ADL's:  Intact  ?Cognition: WNL  ?Prognosis:  Good  ? ? ?DIAGNOSES:  ?  ICD-10-CM   ?1. Generalized anxiety disorder  F41.1   ?  ?2. Bipolar I disorder (HCC)  F31.9   ?  ?3. Tetrahydrocannabinol (THC) use disorder, mild, abuse  F12.10   ?  ?4. PTSD (post-traumatic stress disorder)  F43.10   ?  ? ? ?Receiving Psychotherapy: Yes  Dr. Mardelle Matte Mitchum ? ? ?RECOMMENDATIONS:  ?PDMP reviewed.  Valium filled 09/24/2021.  Sonata filled 06/14/2021.   ?I provided 20 minutes of face to face time during this encounter, including time spent before and after the visit in records review, medical decision making, counseling pertinent to today's visit, and charting.  ?Discussed the insomnia.  Recommend trying the hydroxyzine at bedtime again to see if that will help him sleep through the night.  He can start out with 1 pill and if that is not effective, take two the next night. ? ?Continue Lamictal 150 mg, 1 po bid.  ?Continue Buspar 15 mg, 3 in am and 2 po qhs.Marland Kitchen ?Cont Valium 5 mg po tid prn. ?Cont Hydroxyzine 25 mg, 1-2 po tid prn.  ?Continue Trileptal 600, 1 po bid. ?Cont Sonata 10 mg qhs prn sleep.  ?Cont therapy with Dr. [name]. ?Return in 3 months ? ?Melony Overly, PA-C  ?

## 2021-10-22 ENCOUNTER — Ambulatory Visit (INDEPENDENT_AMBULATORY_CARE_PROVIDER_SITE_OTHER): Payer: 59 | Admitting: Psychiatry

## 2021-10-22 ENCOUNTER — Other Ambulatory Visit: Payer: Self-pay

## 2021-10-22 DIAGNOSIS — F40232 Fear of other medical care: Secondary | ICD-10-CM | POA: Diagnosis not present

## 2021-10-22 DIAGNOSIS — Z630 Problems in relationship with spouse or partner: Secondary | ICD-10-CM

## 2021-10-22 DIAGNOSIS — F411 Generalized anxiety disorder: Secondary | ICD-10-CM | POA: Diagnosis not present

## 2021-10-22 DIAGNOSIS — F1210 Cannabis abuse, uncomplicated: Secondary | ICD-10-CM

## 2021-10-22 DIAGNOSIS — Z9189 Other specified personal risk factors, not elsewhere classified: Secondary | ICD-10-CM | POA: Diagnosis not present

## 2021-10-22 DIAGNOSIS — F319 Bipolar disorder, unspecified: Secondary | ICD-10-CM | POA: Diagnosis not present

## 2021-10-22 DIAGNOSIS — Z8659 Personal history of other mental and behavioral disorders: Secondary | ICD-10-CM

## 2021-10-22 NOTE — Progress Notes (Unsigned)
Psychotherapy Progress Note [facility] [name], PhD LP  Patient ID: Tristan [name])    MRN: [id number] Therapy format: Individual psychotherapy Date: 10/22/2021      Start: 3:15p     Stop: ***:***     Time Spent: *** min Location: In-person   Session narrative (presenting needs, interim history, self-report of stressors and symptoms, applications of prior therapy, status changes, and interventions made in session) Housing quest still moving in earnest, though the troubling drainage situation was let go.  Looking at other houses, Tristan [name] seems to be more eyes-open about the choices and their flaws.  May credit a Reiki or card reading session where she was told to expect some fast-moving changes.  Realtor also advised her how to look at estate sales to forecast   Re. art show, buyer of the big piece backed out when he go there.  Was angered by the flaky customer deflating his expected profit from $900 to $100.  Got some good buzz for his work.  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  Working on developing his underdeveloped Administrator, sports presence.  Got strong interest from an international Magazine features editor noting his stuff is gallery-ready, and he'd like to help.  Tristan [name] worked to [employer] herself from disrupting, and his friend Tristan [name] being there helped.  Grateful that Tristan [name] self-managed as she did.    New issue that his niece Tristan [name] notified she bought a house in their neighborhood.  Turns out to be not so close, but Tristan [name] turned out to be wired tight about it, ostensibly trying to protect Tristan [name] from the great threat of falling prey to his manipulative family (anachornistic, it turns out)   Developing some network   Has taken steps to find a Education officer, community, not successful yet.  Very stressful for not wanting to call and not being able  Now ready to let [place] make a reference to Dr. Irving Copas  Therapeutic modalities: {AM:23362::"Cognitive Behavioral  Therapy","Solution-Oriented/Positive Psychology"}  Mental Status/Observations:  Appearance:   {PSY:22683}     Behavior:  {PSY:21022743}  Motor:  {PSY:22302}  Speech/Language:   {PSY:22685}  Affect:  {PSY:22687}  Mood:  {PSY:31886}  Thought process:  {VZD:63875}  Thought content:    {[phone]}  Sensory/Perceptual disturbances:    {PSY:575-381-6069}  Orientation:  {Psych Orientation:23301::"Fully oriented"}  Attention:  {Good-Fair-Poor ratings:23770::"Good"}    Concentration:  {Good-Fair-Poor ratings:23770::"Good"}  Memory:  {PSY:410-005-9259}  Insight:    {Good-Fair-Poor ratings:23770::"Good"}  Judgment:   {Good-Fair-Poor ratings:23770::"Good"}  Impulse Control:  {Good-Fair-Poor ratings:23770::"Good"}   Risk Assessment: Danger to Self: {Risk:22599::"No"} Self-injurious Behavior: {Risk:22599::"No"} Danger to Others: {Risk:22599::"No"} Physical Aggression / Violence: {Risk:22599::"No"} Duty to Warn: {AMYesNo:22526::"No"} Access to Firearms a concern: {AMYesNo:22526::"No"}  Assessment of progress:  {Progress:22147::"progressing"}  Diagnosis: No diagnosis found. Plan:  *** Other recommendations/advice as may be noted above Continue to utilize previously learned skills ad lib Maintain medication as prescribed and work faithfully with relevant prescriber(s) if any changes are desired or seem indicated Call the clinic on-call service, 988/hotline, 911, or present to [facility] or ER if any life-threatening psychiatric crisis No follow-ups on file. Already scheduled visit in this office 11/05/2021.  [name], PhD [name], PhD LP Clinical Psychologist, [facility] [address], [address] [place], [place] [address] [phone]

## 2021-11-05 ENCOUNTER — Ambulatory Visit (INDEPENDENT_AMBULATORY_CARE_PROVIDER_SITE_OTHER): Payer: 59 | Admitting: Psychiatry

## 2021-11-05 ENCOUNTER — Other Ambulatory Visit: Payer: Self-pay

## 2021-11-05 DIAGNOSIS — Z9189 Other specified personal risk factors, not elsewhere classified: Secondary | ICD-10-CM | POA: Diagnosis not present

## 2021-11-05 DIAGNOSIS — F319 Bipolar disorder, unspecified: Secondary | ICD-10-CM | POA: Diagnosis not present

## 2021-11-05 DIAGNOSIS — F411 Generalized anxiety disorder: Secondary | ICD-10-CM | POA: Diagnosis not present

## 2021-11-05 DIAGNOSIS — F40232 Fear of other medical care: Secondary | ICD-10-CM

## 2021-11-05 DIAGNOSIS — Z630 Problems in relationship with spouse or partner: Secondary | ICD-10-CM

## 2021-11-05 DIAGNOSIS — Z8659 Personal history of other mental and behavioral disorders: Secondary | ICD-10-CM

## 2021-11-05 NOTE — Progress Notes (Signed)
Psychotherapy Progress Note ?[facility] ?Luan Moore, PhD LP ? ?Patient ID: Tristan [name])    MRN: [id number] ?Therapy format: Individual psychotherapy ?Date: 11/05/2021      Start: 2:18p     Stop: 3:06p     Time Spent: 48 min ?Location: In-person  ? ?Session narrative (presenting needs, interim history, self-report of stressors and symptoms, applications of prior therapy, status changes, and interventions made in session) ?Made the call to [facility], progressing toward consultation and later examination.  Indications of school construction ramping up, which he expect to mean a more urgent [name], potentially compelled again to make impulsive decisions.  Overall, still more temperate about house hunting, and confirmed she knows the term "steamroller" and doesn't like it but is sanguine enough about it.  Overall, more speakable if he needs to bring something to her attention. ? ?Re. art, started pursuing gallery connection, but dismayed to find his contact was unaware that [name] even is an Public relations account executive.  Managed to be bummed a few days, not pissed.  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  Sees the value, both monetary and eventual donations, of sorting through.  Clear that he can have plenty after he winnows, but still bittersweet sometimes to part with old dreams.  Finding some sentiment in some collections. ? ?Spring and DST are helping with energy.  Extended screen time has been noted causing some insomnia.  Re-briefed on blue light control and setting electronics.   ? ?Leanna issue more or less evaporated, but it will come back if here is more news.  Clear that [name] remains primed to resent her and/or protect him re his own family.  Encouraged to be willing to address her and let her know if it does not take that kind of protection, still on the hunch that  much of [name]'s anxiety is, in her understanding, trying to prevent him from blowing up or breaking down.  If so, important to let her know when he's got this, it does not take heroics on her part.  If she is actually codependent, and needs the drama to organize herself, that will out and become addressable in its own turn. ? ?Therapeutic modalities: Cognitive Behavioral Therapy and Solution-Oriented/Positive Psychology ? ?Mental Status/Observations: ? ?Appearance:   Casual     ?Behavior:  Appropriate  ?Motor:  Normal  ?Speech/Language:   Clear and Coherent  ?Affect:  Appropriate  ?Mood:  Less anxious,  less down  ?Thought process:  normal  ?Thought content:    WNL  ?Sensory/Perceptual disturbances:    WNL  ?Orientation:  Fully oriented  ?Attention:  Good  ?  ?Concentration:  Good  ?Memory:  WNL  ?Insight:    Good  ?Judgment:   Good  ?Impulse Control:  Good  ? ?Risk Assessment: ?Danger to Self: No Self-injurious Behavior: No ?Danger to Others: No Physical Aggression / Violence: No ?Duty to Warn: No Access to Firearms a concern: No ? ?Assessment of progress:  progressing ? ?Diagnosis: ?  ICD-10-CM   ?1. Generalized anxiety disorder  F41.1   ?  ?2. Bipolar I disorder ([place])  F31.9   ?  ?3. 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  ?4. At risk for dental problems  Z91.89   ?  ?5. Relationship problem between partners  Z63.0   ?  ?6. History of posttraumatic stress disorder (  PTSD)  Z86.59   ?  ? ?Plan:  ?Follow through on dental consultation ?Continue willing to voice concerns, objections, or counterpoint as needed in housing search process ?Continue willing to ask [name] for change, if truly desired, regarding "steamroller" interactions, and stay ready to let her know if he catches her over doing something trying to protect his feelings which he can do for himself ?With light activated insomnia, set electronic screens for sunset color shading in the evening and self-monitor for need for breaks in screen time ?Other  recommendations/advice as may be noted above ?Continue to utilize previously learned skills ad lib ?Maintain medication as prescribed and work faithfully with relevant prescriber(s) if any changes are desired or seem indicated ?Call the clinic on-call service, 988/hotline, 911, or present to [facility] or ER if any life-threatening psychiatric crisis ?Return for session(s) already scheduled. ?Already scheduled visit in this office 11/21/2021. ? ?Blanchie Serve, PhD ?Luan Moore, PhD LP ?Clinical Psychologist, [facility] ?[facility] ?[address], [address] ?[place], [place] [address] ?(o) [phone] ?

## 2021-11-16 ENCOUNTER — Other Ambulatory Visit: Payer: Self-pay | Admitting: Physician Assistant

## 2021-11-16 IMAGING — CR DG CHEST 2V
2 series · 2 of 2 positions shown · non-contrast
Comparison: None.

CLINICAL DATA: Generalized chest pain for 3 days

EXAM:
CHEST - 2 VIEW

[w chest pa]
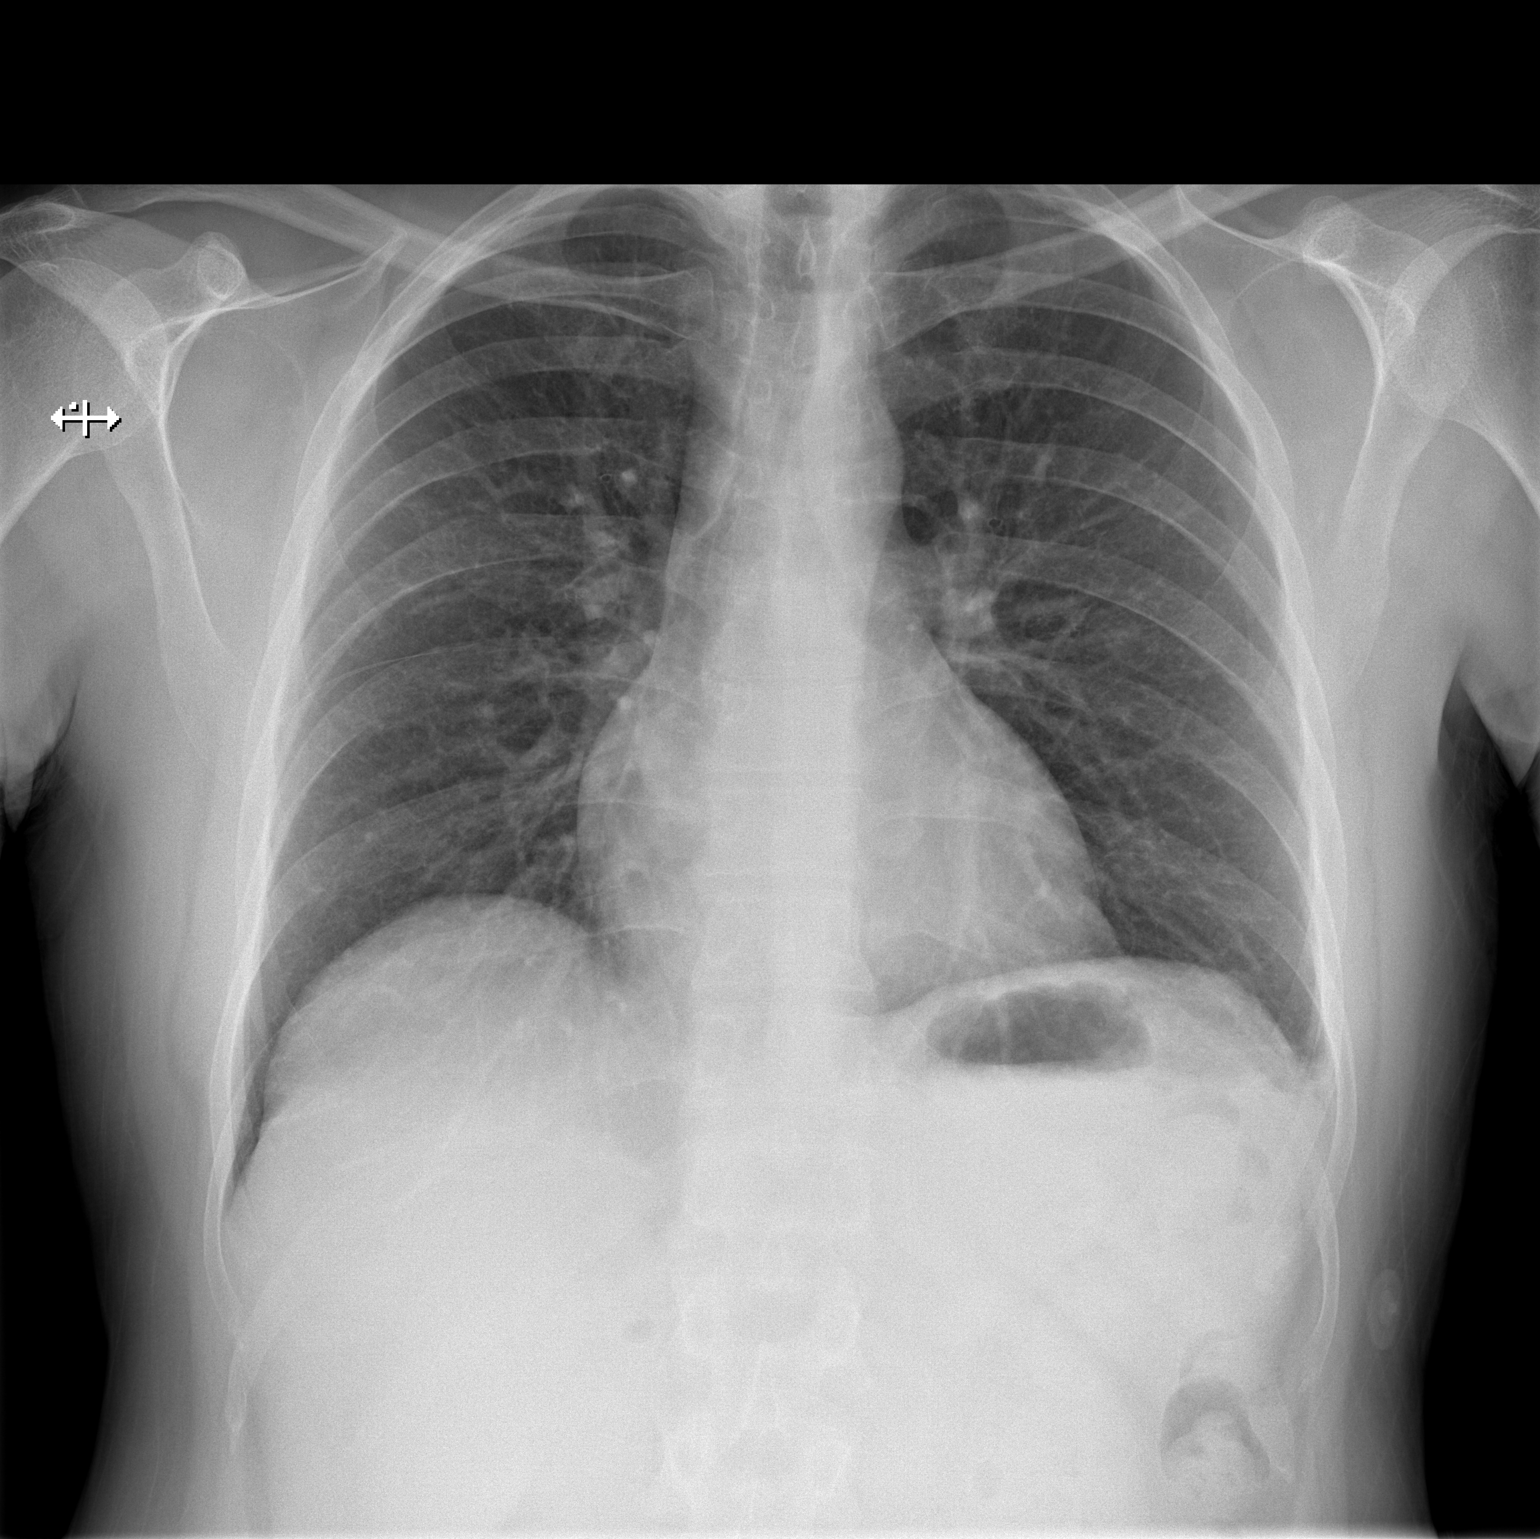

[w chest lat]
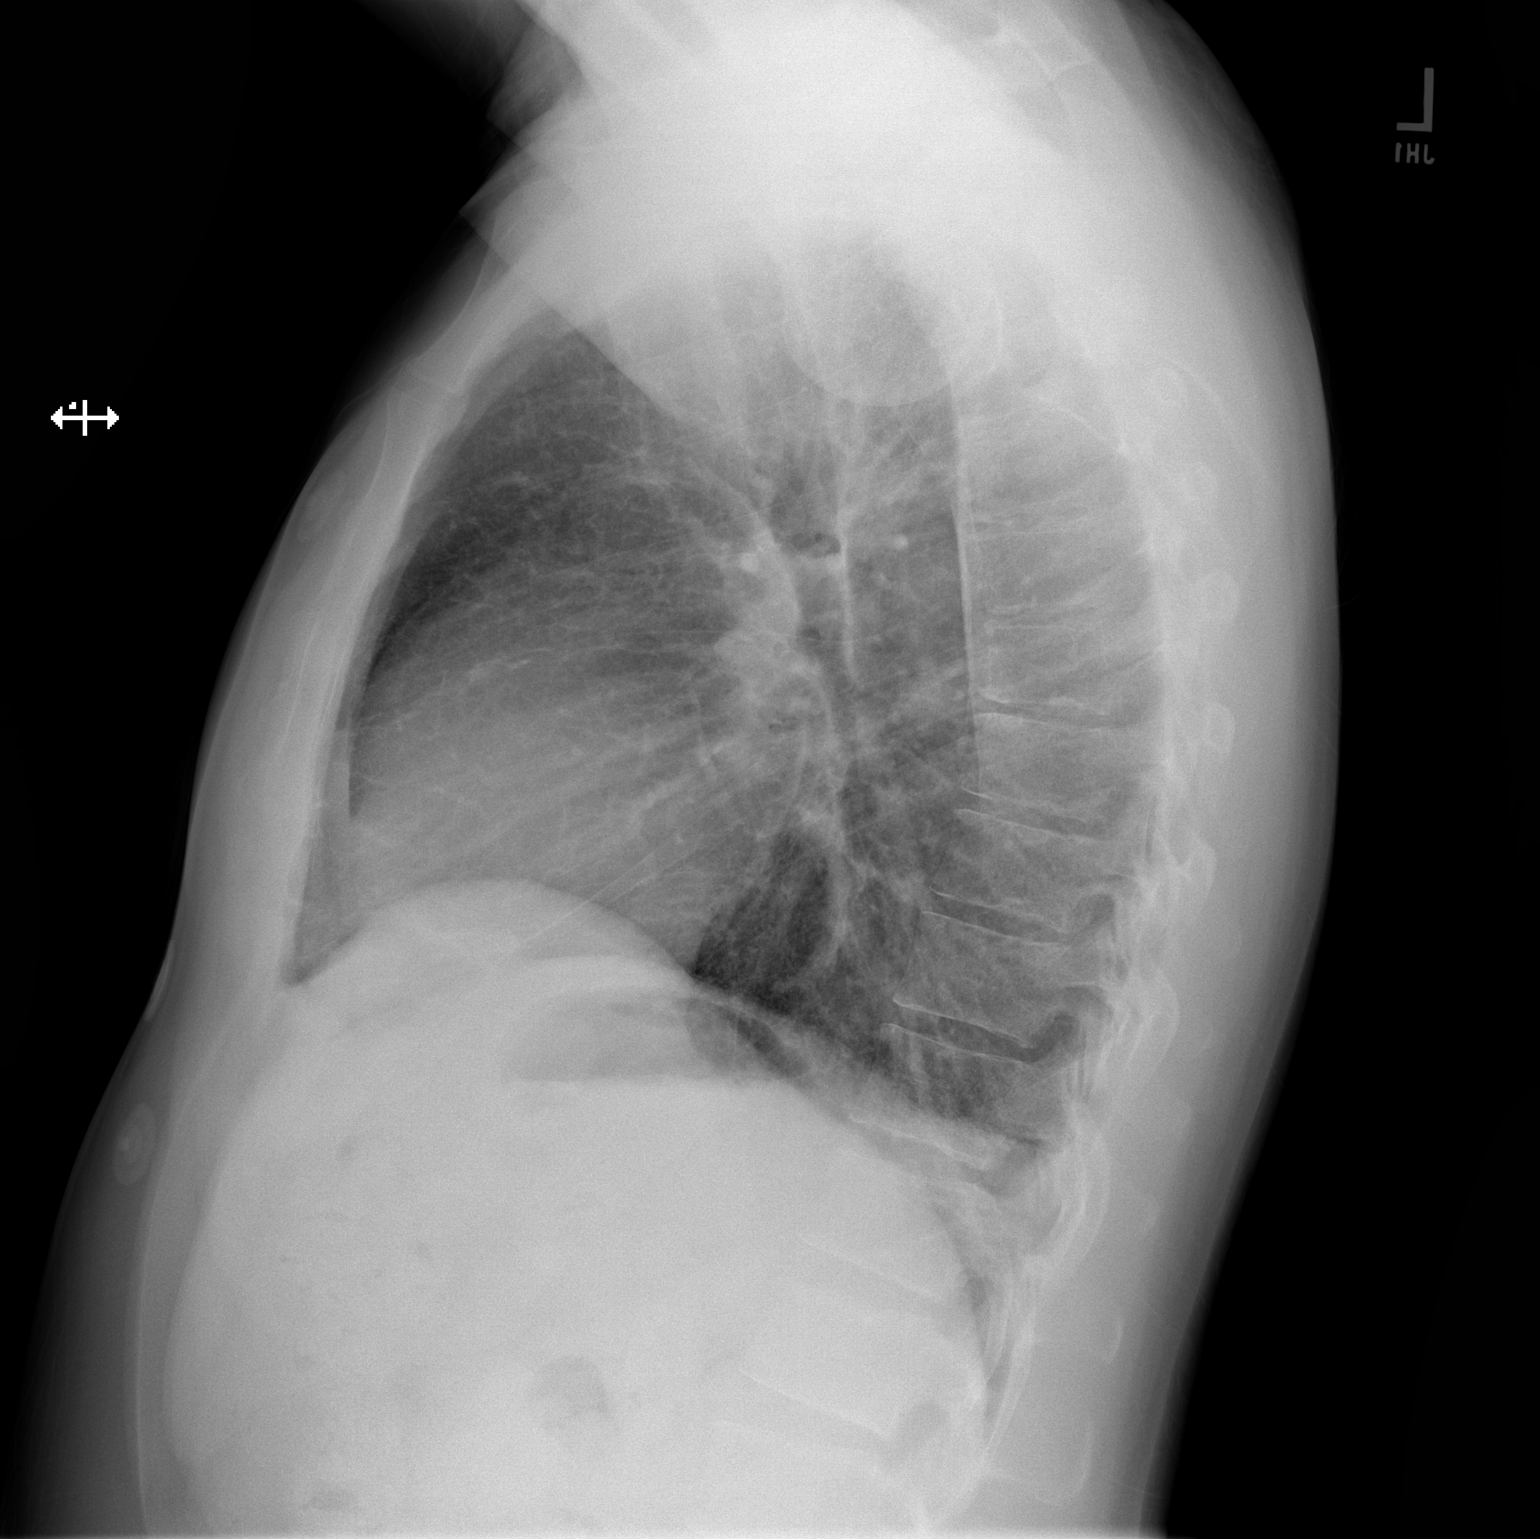

[2 of 2 positions shown; findings below may reference images not displayed]

FINDINGS: The heart size and mediastinal contours are within normal limits.
Both lungs are clear. The visualized skeletal structures are
unremarkable.
IMPRESSION: No active cardiopulmonary disease.

## 2021-11-21 ENCOUNTER — Ambulatory Visit (INDEPENDENT_AMBULATORY_CARE_PROVIDER_SITE_OTHER): Payer: 59 | Admitting: Psychiatry

## 2021-11-21 DIAGNOSIS — Z630 Problems in relationship with spouse or partner: Secondary | ICD-10-CM

## 2021-11-21 DIAGNOSIS — F411 Generalized anxiety disorder: Secondary | ICD-10-CM

## 2021-11-21 DIAGNOSIS — F319 Bipolar disorder, unspecified: Secondary | ICD-10-CM

## 2021-11-21 DIAGNOSIS — F1210 Cannabis abuse, uncomplicated: Secondary | ICD-10-CM

## 2021-11-21 DIAGNOSIS — F40232 Fear of other medical care: Secondary | ICD-10-CM

## 2021-11-21 DIAGNOSIS — Z9189 Other specified personal risk factors, not elsewhere classified: Secondary | ICD-10-CM

## 2021-11-21 DIAGNOSIS — Z8659 Personal history of other mental and behavioral disorders: Secondary | ICD-10-CM

## 2021-11-21 NOTE — Progress Notes (Unsigned)
Psychotherapy Progress Note ?[facility] ?Tristan Czar, PhD LP ? ?Patient ID: Tristan [name])    MRN: [id number] ?Therapy format: Individual psychotherapy ?Date: 11/21/2021      Start: 2:15p     Stop: ***:***     Time Spent: *** min ?Location: In-person  ? ?Session narrative (presenting needs, interim history, self-report of stressors and symptoms, applications of prior therapy, status changes, and interventions made in session) ?Worn out, having a lot of anxious tension at night, difficulty sleeping.  Attributes to not being able to "keep up with [facility]".  Emotional roller coaster about things like finding a house and the way Tristan [name] races from looking intently at a house about to come to almost ready to declare to neighbors they're leaving, and rumors coming up in the neighborhood about feared (and loathed) land use news in the neighborhood they're in.  Has been anxious about the possibility she'll go "steamroller" with a plan he doesn't want.  Also having to contend with moments that seem like she has achieve him being as happy and excited as sheis about a a plan.  Ran into another attempt to plan and invite herself along for a flea market trip, wound up not going b/c he was too tired and in some pain.  Also had to forgo yoga, another time they usually can spend together nonanxiously.  Tristan [name] herself has been tossing and turning, which contributes to his difficulty sleeping.  And sleep loss is making him more irritable behind the wheel and other moments.  Infuriated him  ? ?Dental visit with Tristan [name] turned out very good.  Well-pleased with communication, and Dr. Ladora [name] manner, in the consultation they had.  Will have his exam first week in May, anticipates possibility of full dentures.   ? ?Therapeutic modalities: {AM:23362::"Cognitive Behavioral Therapy","Solution-Oriented/Positive Psychology"} ? ?Mental Status/Observations: ? ?Appearance:   {PSY:22683}     ?Behavior:  {PSY:21022743}   ?Motor:  {PSY:22302}  ?Speech/Language:   {PSY:22685}  ?Affect:  {PSY:22687}  ?Mood:  {PSY:31886}  ?Thought process:  {PSY:31888}  ?Thought content:    {PSY:6062736398}  ?Sensory/Perceptual disturbances:    {PSY:309-751-4364}  ?Orientation:  {Psych Orientation:23301::"Fully oriented"}  ?Attention:  {Good-Fair-Poor ratings:23770::"Good"}  ?  ?Concentration:  {Good-Fair-Poor ratings:23770::"Good"}  ?Memory:  {PSY:719-209-6959}  ?Insight:    {Good-Fair-Poor ratings:23770::"Good"}  ?Judgment:   {Good-Fair-Poor ratings:23770::"Good"}  ?Impulse Control:  {Good-Fair-Poor ratings:23770::"Good"}  ? ?Risk Assessment: ?Danger to Self: {Risk:22599::"No"} Self-injurious Behavior: {Risk:22599::"No"} ?Danger to Others: {Risk:22599::"No"} Physical Aggression / Violence: {Risk:22599::"No"} ?Duty to Warn: {AMYesNo:22526::"No"} Access to Firearms a concern: {AMYesNo:22526::"No"} ? ?Assessment of progress:  {Progress:22147::"progressing"} ? ?Diagnosis: ?No diagnosis found. ?Plan:  ?*** ?Other recommendations/advice as may be noted above ?Continue to utilize previously learned skills ad lib ?Maintain medication as prescribed and work faithfully with relevant prescriber(s) if any changes are desired or seem indicated ?Call the clinic on-call service, 988/hotline, 911, or present to [facility] or ER if any life-threatening psychiatric crisis ?Return for session(s) already scheduled. ?Already scheduled visit in this office 12/12/2021. ? ?Tristan Fries, PhD ?Tristan Czar, PhD LP ?Clinical Psychologist, [facility] ?[facility] ?[address], [address] ?[place], [place] [address] ?(o) [phone] ?

## 2021-12-12 ENCOUNTER — Ambulatory Visit (INDEPENDENT_AMBULATORY_CARE_PROVIDER_SITE_OTHER): Payer: 59 | Admitting: Psychiatry

## 2021-12-12 DIAGNOSIS — Z630 Problems in relationship with spouse or partner: Secondary | ICD-10-CM | POA: Diagnosis not present

## 2021-12-12 DIAGNOSIS — F319 Bipolar disorder, unspecified: Secondary | ICD-10-CM | POA: Diagnosis not present

## 2021-12-12 DIAGNOSIS — F40232 Fear of other medical care: Secondary | ICD-10-CM

## 2021-12-12 DIAGNOSIS — Z8659 Personal history of other mental and behavioral disorders: Secondary | ICD-10-CM

## 2021-12-12 DIAGNOSIS — F411 Generalized anxiety disorder: Secondary | ICD-10-CM
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
## 2021-12-26 ENCOUNTER — Ambulatory Visit (INDEPENDENT_AMBULATORY_CARE_PROVIDER_SITE_OTHER): Payer: 59 | Admitting: Psychiatry

## 2021-12-26 DIAGNOSIS — F319 Bipolar disorder, unspecified: Secondary | ICD-10-CM | POA: Diagnosis not present

## 2021-12-26 DIAGNOSIS — F411 Generalized anxiety disorder: Secondary | ICD-10-CM | POA: Diagnosis not present

## 2021-12-26 DIAGNOSIS — Z630 Problems in relationship with spouse or partner: Secondary | ICD-10-CM | POA: Diagnosis not present

## 2021-12-26 DIAGNOSIS — Z8659 Personal history of other mental and behavioral disorders: Secondary | ICD-10-CM | POA: Diagnosis not present

## 2021-12-26 NOTE — Progress Notes (Unsigned)
Psychotherapy Progress Note ?[facility] ?[name], PhD LP ? ?Patient ID: Tristan [name])    MRN: [id number] ?Therapy format: {Therapy Types:21967::"Individual psychotherapy"} ?Date: 12/26/2021      Start: ***:***     Stop: ***:***     Time Spent: *** min ?Location: {SvcLoc:22530::"In-person"}  ? 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  May 1 was anniversary of starting their relationship, and Tristan [name] was doing an open house same day, Tristan [name] declared she was doing the cause instead.  Not clear she understood, despite practice letting her know, that that kind of thing makes him feel downrated.  Had to contend with her taking a day off, proposing to go thrifting (no longer an interest) and then making him responsible for figuring out where to go.  Other anniversary plans half made, then fell through, changes of mind what to do, and showing passion for making plans with others, usually women who share the neighborhood cause.  Succeeded in inviting her out on a flea market trip, good time, then she went straight to the cause again.  Compounded by him feeling such anger that he tries to get alone and quiet to keep from blasting, and she takes it as passive aggression.   ? ? ? ?Therapeutic modalities: {AM:23362::"Cognitive Behavioral Therapy","Solution-Oriented/Positive Psychology"} ? ?Mental Status/Observations: ? ?Appearance:   {PSY:22683}     ?Behavior:  {PSY:21022743}  ?Motor:  {PSY:22302}  ?Speech/Language:   {PSY:22685}  ?Affect:  {PSY:22687}  ?Mood:  {PSY:31886}  ?Thought process:  {PSY:31888}  ?Thought content:     {PSY:279 667 7262}  ?Sensory/Perceptual disturbances:    {PSY:479-839-2061}  ?Orientation:  {Psych Orientation:23301::"Fully oriented"}  ?Attention:  {Good-Fair-Poor ratings:23770::"Good"}  ?  ?Concentration:  {Good-Fair-Poor ratings:23770::"Good"}  ?Memory:  {PSY:250-781-3837}  ?Insight:    {Good-Fair-Poor ratings:23770::"Good"}  ?Judgment:   {Good-Fair-Poor ratings:23770::"Good"}  ?Impulse Control:  {Good-Fair-Poor ratings:23770::"Good"}  ? ?Risk Assessment: ?Danger to Self: {Risk:22599::"No"} Self-injurious Behavior: {Risk:22599::"No"} ?Danger to Others: {Risk:22599::"No"} Physical Aggression / Violence: {Risk:22599::"No"} ?Duty to Warn: {AMYesNo:22526::"No"} Access to Firearms a concern: {AMYesNo:22526::"No"} ? ?Assessment of progress:  {Progress:22147::"progressing"} ? ?Diagnosis: ?No diagnosis found. ?Plan:  ?*** ?Other recommendations/advice as may be noted above ?Continue to utilize previously learned skills ad lib ?Maintain medication as prescribed and work faithfully with relevant prescriber(s) if any changes are desired or seem indicated ?Call the clinic on-call service, 988/hotline, 911, or present to [facility] or ER if any life-threatening psychiatric crisis ?Return for session(s) already scheduled. ?Already scheduled visit in this office 01/09/2022. ? ?[name], PhD ?[name], PhD LP ?Clinical Psychologist, [facility] ?[facility] ?[address] ?[place], [place] [address] ?(o) [phone] ?

## 2022-01-09 ENCOUNTER — Ambulatory Visit (INDEPENDENT_AMBULATORY_CARE_PROVIDER_SITE_OTHER): Payer: 59 | Admitting: Psychiatry

## 2022-01-09 DIAGNOSIS — Z9189 Other specified personal risk factors, not elsewhere classified: Secondary | ICD-10-CM

## 2022-01-09 DIAGNOSIS — F40232 Fear of other medical care: Secondary | ICD-10-CM

## 2022-01-09 DIAGNOSIS — Z8659 Personal history of other mental and behavioral disorders: Secondary | ICD-10-CM | POA: Diagnosis not present

## 2022-01-09 DIAGNOSIS — Z630 Problems in relationship with spouse or partner: Secondary | ICD-10-CM | POA: Diagnosis not present

## 2022-01-09 DIAGNOSIS — F319 Bipolar disorder, unspecified: Secondary | ICD-10-CM | POA: Diagnosis not present

## 2022-01-09 DIAGNOSIS — F411 Generalized anxiety disorder: Secondary | ICD-10-CM | POA: Diagnosis not present

## 2022-01-09 NOTE — Progress Notes (Unsigned)
Psychotherapy Progress Note [facility] Tristan Czar, PhD LP  Patient ID: Tristan [name])    MRN: [id number] Therapy format: Individual psychotherapy Date: 01/09/2022      Start: 2:10p     Stop: 3:00p     Time Spent: 50 min Location: In-person   Session narrative (presenting needs, interim history, self-report of stressors and symptoms, applications of prior therapy, status changes, and interventions made in session) Stuck a couple weeks.  Tristan [name]'s job terminated, with disingenuous assurances that I was not for perforance, but a bad review and lack of severance on the way out.  Relationally, has worked to thank her for some things, since she c/o not being appreciative, actually brought her to tears.  Still seeing her be unreliable to follow through on what she says she's willing to do better about getting engrossed in things and forgetting about him.  So far, seeing her invent new projects, get compulsive about them, in an extensvie tile renovation in the kitchen now stretching 2-3 times as long as forecast, and her thoughts and feelings about the project dominate all conversation.  Notably, got to the acknowledgment that she had been obsessing about the neighborhood issue and would cut back, but so far, she seems to have remained engrossed in that, too.  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  Feeling defeated, more than angry.  Trying to accept more that Tristan [name] is just constitutionally ignorant of him and what he wants or feels.  Having EMA many nights now, in response to the loneliness and helplessness he feels in it all.    Discussed options to assert further and basic choice he's making to curb resentment by wanting less, and doubling down on letting Tristan [name] be the way she is.  Interpreted anhedonia and los of appetite  as instinctive responses to saturating experiences of helplessness, affirmed caring gestures (e.g., wiping the dust off Tristan [name]'s forehead and kissing it when he found her despondent over using the shop vac to blow, instead of pick up, tile dust all over the unprotected surfaces of the kitchen and herself.  Encouraged being willing to realistically answer "What's wrong?" question with how his environment is in chaos, his wife is emotionally missing much of the time even though she said she was going to be more available, etc.  Therapeutic modalities: Cognitive Behavioral Therapy, Solution-Oriented/Positive Psychology, and Ego-Supportive  Mental Status/Observations:  Appearance:   {PSY:22683}     Behavior:  {PSY:21022743}  Motor:  {PSY:22302}  Speech/Language:   {PSY:22685}  Affect:  {PSY:22687}  Mood:  {PSY:31886}  Thought process:  {PSY:31888}  Thought content:    {PSY:323-553-8455}  Sensory/Perceptual disturbances:    {PSY:917-155-4254}  Orientation:  {Psych Orientation:23301::"Fully oriented"}  Attention:  {Good-Fair-Poor ratings:23770::"Good"}    Concentration:  {Good-Fair-Poor ratings:23770::"Good"}  Memory:  {[phone]}  Insight:    {Good-Fair-Poor ratings:23770::"Good"}  Judgment:   {Good-Fair-Poor ratings:23770::"Good"}  Impulse Control:  {Good-Fair-Poor ratings:23770::"Good"}   Risk Assessment: Danger to Self: {Risk:22599::"No"} Self-injurious Behavior: {Risk:22599::"No"} Danger to Others: {Risk:22599::"No"} Physical Aggression / Violence: {Risk:22599::"No"} Duty to Warn: {AMYesNo:22526::"No"} Access to Firearms a concern: {AMYesNo:22526::"No"}  Assessment of progress:  {Progress:22147::"progressing"}  Diagnosis: No diagnosis found. Plan:  *** Other recommendations/advice as may be noted above Continue to utilize previously learned skills ad lib Maintain medication as prescribed and work faithfully with relevant prescriber(s) if any changes are desired or seem  indicated Call  the clinic on-call service, 988/hotline, 911, or present to [facility] or ER if any life-threatening psychiatric crisis Return for session(s) already scheduled. Already scheduled visit in this office 01/16/2022.  Tristan Fries, PhD Tristan Czar, PhD LP Clinical Psychologist, [facility] [address] [place], [place] [address] [phone]

## 2022-01-16 ENCOUNTER — Ambulatory Visit: Payer: 59 | Admitting: Physician Assistant

## 2022-01-30 ENCOUNTER — Ambulatory Visit: Payer: 59 | Admitting: Psychiatry

## 2022-02-11 ENCOUNTER — Encounter: Payer: Self-pay | Admitting: Physician Assistant

## 2022-02-11 ENCOUNTER — Ambulatory Visit (INDEPENDENT_AMBULATORY_CARE_PROVIDER_SITE_OTHER): Payer: 59 | Admitting: Physician Assistant

## 2022-02-11 DIAGNOSIS — F4310 Post-traumatic stress disorder, unspecified: Secondary | ICD-10-CM

## 2022-02-11 DIAGNOSIS — G4700 Insomnia, unspecified: Secondary | ICD-10-CM

## 2022-02-11 DIAGNOSIS — F411 Generalized anxiety disorder: Secondary | ICD-10-CM

## 2022-02-11 DIAGNOSIS — F319 Bipolar disorder, unspecified: Secondary | ICD-10-CM

## 2022-02-11 MED ORDER — OXCARBAZEPINE 600 MG PO TABS: 600.0000 mg | ORAL_TABLET | Freq: Two times a day (BID) | ORAL | 3 refills | Status: DC

## 2022-02-11 MED ORDER — ZALEPLON 10 MG PO CAPS: ORAL_CAPSULE | ORAL | 5 refills | Status: DC

## 2022-02-13 ENCOUNTER — Ambulatory Visit (INDEPENDENT_AMBULATORY_CARE_PROVIDER_SITE_OTHER): Payer: 59 | Admitting: Psychiatry

## 2022-02-13 DIAGNOSIS — F319 Bipolar disorder, unspecified: Secondary | ICD-10-CM

## 2022-02-13 DIAGNOSIS — Z630 Problems in relationship with spouse or partner: Secondary | ICD-10-CM | POA: Diagnosis not present

## 2022-02-13 DIAGNOSIS — Z9189 Other specified personal risk factors, not elsewhere classified: Secondary | ICD-10-CM

## 2022-02-13 DIAGNOSIS — Z8659 Personal history of other mental and behavioral disorders: Secondary | ICD-10-CM | POA: Diagnosis not present

## 2022-02-13 DIAGNOSIS — F411 Generalized anxiety disorder: Secondary | ICD-10-CM | POA: Diagnosis not present
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
## 2022-02-25 ENCOUNTER — Telehealth: Payer: Self-pay | Admitting: Physician Assistant

## 2022-02-25 NOTE — Telephone Encounter (Signed)
It's ok for him to take, but might make him more agitated, irritable, cause insomnia. It's best that he see someone at urgent care or his PCP to see if something else might be as appropriate without those risks.

## 2022-02-25 NOTE — Telephone Encounter (Signed)
Pt's wife, [name] called.  She said pt has done something to his back and is having spasms.  She has 20 tablets of Prednisone 20mg .  She is wondering if it's ok for him to have them with the current medication he is on.  She is trying to get him to an urgent care place as his regular doc can't see him today.  Pls call her back at [phone].

## 2022-02-25 NOTE — Telephone Encounter (Signed)
Please advise 

## 2022-02-26 NOTE — Telephone Encounter (Signed)
Pt did go to urgent care,stated he was ok now

## 2022-02-28 ENCOUNTER — Ambulatory Visit (INDEPENDENT_AMBULATORY_CARE_PROVIDER_SITE_OTHER): Payer: 59 | Admitting: Psychiatry

## 2022-02-28 DIAGNOSIS — F319 Bipolar disorder, unspecified: Secondary | ICD-10-CM | POA: Diagnosis not present

## 2022-02-28 DIAGNOSIS — Z630 Problems in relationship with spouse or partner: Secondary | ICD-10-CM

## 2022-02-28 DIAGNOSIS — Z9189 Other specified personal risk factors, not elsewhere classified: Secondary | ICD-10-CM

## 2022-02-28 DIAGNOSIS — F40232 Fear of other medical care: Secondary | ICD-10-CM

## 2022-02-28 DIAGNOSIS — Z8659 Personal history of other mental and behavioral disorders: Secondary | ICD-10-CM

## 2022-02-28 DIAGNOSIS — F411 Generalized anxiety disorder: Secondary | ICD-10-CM | POA: Diagnosis not present

## 2022-02-28 NOTE — Progress Notes (Signed)
Psychotherapy Progress Note [facility] [name], PhD LP  Patient ID: Tristan [name])    MRN: [id number] Therapy format: Individual psychotherapy Date: 02/28/2022      Start: 8:08a     Stop: 8:58a     Time Spent: 50 min Location: In-person   Session narrative (presenting needs, interim history, self-report of stressors and symptoms, applications of prior therapy, status changes, and interventions made in session) Been down with a back strain, injury was spontaneous, stabbing pain just turning back to a position after building a rock pile.  Having to be careful in recovery.  [name] overly urgent pushing for what she can do, turns to frustration on her part that he wouldn't "let" her help.  Modeled ways to validate, update, and invite her to do something realistically caring once he figures out what it might be.  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  Also finding the the market for her work is drying up and it's looking less like she can quickly land a new job.  Typically delicate trying to get her to see how she harasses herself part of the time and figures thing are done prematurely.  Meanwhile, extra effort poured into neighborhood business, like [employer].  Saw her get some validation about her neighborhood communication work from a friendly neighbor, relevant to a rivalry with a neighborhood "queen".  Amusingly, she declared success with a "plant free day at 9:30 in the morning, only to bring home 6 plants in late afternoon.  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  Otherwise, still getting out with friends when able, and work on scheduling needed dental work.  Affirmed and encouraged.  Therapeutic modalities: Cognitive Behavioral Therapy, Solution-Oriented/Positive Psychology, and Ego-Supportive  Mental  Status/Observations:  Appearance:   Casual     Behavior:  Appropriate  Motor:  Normal  Speech/Language:   Clear and Coherent  Affect:  Appropriate  Mood:  concerned  Thought process:  normal  Thought content:    WNL  Sensory/Perceptual disturbances:    WNL  Orientation:  Fully oriented  Attention:  Good    Concentration:  Good  Memory:  WNL  Insight:    Good  Judgment:   Good  Impulse Control:  Good   Risk Assessment: Danger to Self: No Self-injurious Behavior: No Danger to Others: No Physical Aggression / Violence: No Duty to Warn: No Access to Firearms a concern: No  Assessment of progress:  stabilized  Diagnosis:   ICD-10-CM   1. Bipolar I disorder (HCC)  F31.9     2. Generalized anxiety disorder  F41.1     3. Relationship problem between partners  Z63.0     4. History of posttraumatic stress disorder (PTSD)  Z86.59     5. 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 At risk for dental problems  Z91.89      Plan:  Build on new social outings and [place] relaxing and being more available Reward the better wherever possible, be as matter of fact as possible when assertiveness is needed, and self-affirm it is OK to honestly answer "what's wrong" and let honest sadness speak rather than anger Open invitation to couples session(s) Address sleep hygiene and light control as needed Follow through with dental work as intended Other recommendations/advice as may be noted above Continue to utilize previously learned  skills ad lib Maintain medication as prescribed and work faithfully with relevant prescriber(s) if any changes are desired or seem indicated Call the clinic on-call service, 988/hotline, 911, or present to [facility] or ER if any life-threatening psychiatric crisis Return for session(s) already scheduled. Already scheduled visit in this office 04/07/2022.  [name], PhD [name], PhD LP Clinical Psychologist, [facility] [address] [place], [place] [address] [phone]

## 2022-04-07 ENCOUNTER — Ambulatory Visit (INDEPENDENT_AMBULATORY_CARE_PROVIDER_SITE_OTHER): Payer: 59 | Admitting: Psychiatry

## 2022-04-07 DIAGNOSIS — F411 Generalized anxiety disorder: Secondary | ICD-10-CM

## 2022-04-07 DIAGNOSIS — Z8659 Personal history of other mental and behavioral disorders: Secondary | ICD-10-CM

## 2022-04-07 DIAGNOSIS — Z630 Problems in relationship with spouse or partner: Secondary | ICD-10-CM

## 2022-04-07 DIAGNOSIS — F319 Bipolar disorder, unspecified: Secondary | ICD-10-CM | POA: Diagnosis not present

## 2022-04-07 DIAGNOSIS — F40232 Fear of other medical care: Secondary | ICD-10-CM

## 2022-04-07 DIAGNOSIS — Z9189 Other specified personal risk factors, not elsewhere classified: Secondary | ICD-10-CM

## 2022-04-07 NOTE — Progress Notes (Signed)
Psychotherapy Progress Note [facility] Tristan Moore, PhD LP  Patient ID: Tristan [name])    MRN: [id number] Therapy format: Individual psychotherapy Date: 04/07/2022      Start: 9:15a     Stop: 10:02a     Time Spent: 47 min Location: In-person   Session narrative (presenting needs, interim history, self-report of stressors and symptoms, applications of prior therapy, status changes, and interventions made in session) Frustrated with self, and with lingering unfinished kitchen reno and the series of add-ons Tristan [name] took on then needed Tristan [name] to work through.  Addressing it with Tristan [name] eventually got an admission that she was over-busy to keep from feeling her job loss and request for priorities to help her clutter get out of his way.  She got wound up over her issues, and when he delivered the list, she stewed and wrote angry things on his note with demands back to him what to do for her.  Was able to get point across about her changing her tune, got some movement on decluttering, and an agreement -- albeit hard to hold -- to no more projects until further notice.  Seems to be an acknowledgment that she gets addictive.    Mosquitoes have gotten in the house somehow.  Got out to an art show, got to see dear friends.  Another show cancelled, some disappointment.  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  Had an unusually good time seeing two favorites in [place], unusually happy, came home to [place] in a meltdown, having discovered the source of the mosquitoes and having a shame attack that it was standing water in one of her many planters.  Sheepishly asked if he was going to make her get rid of all her (many, many) plants.  Another vignette about a productive argument (so far) about her suddenly going off typical pattern of how they coordinate bringing in groceries, locked them  out of the house.    Family reunion (Tristan [name]) since last time met, in [place].  Backdrop of toxic relationships and estrangement from her sibs and mother and discovering they had "stalked" her on LinkedIn.  As it approached, social media traffic involved probes and remarks that fired her up again.  Coincided with neighborhood watch and Tristan [name], and commitments to bring food Tristan [name] made, which Tristan [name] had to carry off against his wishes.  Still involved in the school issue, and in the issue over the church facility donated to Tristan [name], which now Tristan [name] reveals they want to turn around and sell.  Neighborhood assn president tends to be a gossip as well.  On a positive note, helpful with neighbor Tristan [name], who has complicated heart care, gratifying to have him around as a kind of extended dad.  Affirmed and encouraged.  Personally, anxiety is up with continued temporary arrangements in the house, tends to isolate to cope.  Therapeutic modalities: Cognitive Behavioral Therapy, Solution-Oriented/Positive Psychology, Ego-Supportive, and Assertiveness/Communication  Mental Status/Observations:  Appearance:   Casual     Behavior:  Appropriate  Motor:  Normal  Speech/Language:   Clear and Coherent  Affect:  Appropriate  Mood:  anxious and dysthymic  Thought process:  normal  Thought content:    WNL  Sensory/Perceptual disturbances:    WNL  Orientation:  Fully oriented  Attention:  Good    Concentration:  Good  Memory:  WNL  Insight:    Good  Judgment:   Good  Impulse Control:  Good   Risk Assessment: Danger to Self: No Self-injurious Behavior: No Danger to Others: No Physical Aggression / Violence: No Duty to Warn: No Access to Firearms a concern: No  Assessment of progress:  stabilized  Diagnosis:   ICD-10-CM   1. Bipolar I disorder ([place])  F31.9     2. Generalized anxiety disorder  F41.1     3. Relationship problem between partners  Z63.0     4. History of  posttraumatic stress disorder (PTSD)  Z86.59     5. 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 At risk for dental problems  Z91.89      Plan:  With Tristan [name] -- Keep working constructive agreement to work projects she has open, not create new ones.  Reward the better wherever possible, be as matter of fact as possible when assertiveness is needed, and self-affirm it is OK to honestly answer "what's wrong" and let honest sadness speak rather than anger.  Open invitation to couples session(s). Household environment -- as able, reduce clutter and restore areas he can be in without having to isolate to hold down irritating sights Friendships -- Build on guys' outings to strengthen friendships Sleep regulation -- Address sleep hygiene and light control as needed Dental -- Follow through with planned and needed dental work as intended Other recommendations/advice as may be noted above Continue to utilize previously learned skills ad lib Maintain medication as prescribed and work faithfully with relevant prescriber(s) if any changes are desired or seem indicated Call the clinic on-call service, 988/hotline, 911, or present to [facility] or ER if any life-threatening psychiatric crisis Return for session(s) already scheduled. Already scheduled visit in this office 04/24/2022.  Blanchie Serve, PhD Tristan Moore, PhD LP Clinical Psychologist, [facility] [address] [place], [place] [address] [phone]

## 2022-04-24 ENCOUNTER — Ambulatory Visit (INDEPENDENT_AMBULATORY_CARE_PROVIDER_SITE_OTHER): Payer: 59 | Admitting: Psychiatry

## 2022-04-24 DIAGNOSIS — F319 Bipolar disorder, unspecified: Secondary | ICD-10-CM

## 2022-04-24 DIAGNOSIS — Z630 Problems in relationship with spouse or partner: Secondary | ICD-10-CM | POA: Diagnosis not present

## 2022-04-24 DIAGNOSIS — F411 Generalized anxiety disorder: Secondary | ICD-10-CM | POA: Diagnosis not present

## 2022-04-24 DIAGNOSIS — Z8659 Personal history of other mental and behavioral disorders: Secondary | ICD-10-CM

## 2022-04-24 DIAGNOSIS — Z9189 Other specified personal risk factors, not elsewhere classified: Secondary | ICD-10-CM

## 2022-04-24 DIAGNOSIS — F40232 Fear of other medical care: Secondary | ICD-10-CM

## 2022-04-24 NOTE — Progress Notes (Signed)
Psychotherapy Progress Note [facility] [name], PhD LP  Patient ID: Tristan [name])    MRN: [id number] Therapy format: Individual psychotherapy Date: 04/24/2022      Start: 1:18p     Stop: 2:05p     Time Spent: 47 min Location: In-person   Session narrative (presenting needs, interim history, self-report of stressors and symptoms, applications of prior therapy, status changes, and interventions made in session) Tristan [name] wants to try medication now, after a long struggle to manage anxiety, depression, and compulsive behavior, but it's paralyzing for her to make the call.  Inquiring about connecting her to service here.  Discussed options, found out she's had a lot of med reactions in adolescence.  Most marked now by intrusive fears and catastrophic thinking, often about the dogs and potential health, sounding like OCD or PTSD.    She's also under consideration for a job with [employer], which would entail moving to [place] area, as soon as 4-6 weeks.  Tristan [name] has always lived in [place], never dealt with moves or other locales, just a few moves in town, and the change to being married, so major change to entertain.  Discussed strategizing to create his own connections and services in the event they do take that option.   Recently dismayed by cancellations among good friends in his recreation group, to where he is no longer seeing his favorite people, tough they seem to see each other.  Support/empathy provided.   Interest in sharpening his distress tolerance skills, acknowledges tendencies to paralyze and blow up.  Outlined event journaling for stressful things (what, how felt, what he did about it, how that worked out) and Theatre manager for desirable activities, things he can do with distress that he can use as a reminder instead of accept helpless appearances and therefore be more prone to lash out.    Therapeutic modalities: Cognitive  Behavioral Therapy, Solution-Oriented/Positive Psychology, and Ego-Supportive  Mental Status/Observations:  Appearance:   Casual     Behavior:  Appropriate  Motor:  Normal  Speech/Language:   Clear and Coherent  Affect:  Appropriate  Mood:  anxious  Thought process:  normal  Thought content:    WNL  Sensory/Perceptual disturbances:    WNL  Orientation:  Fully oriented  Attention:  Good    Concentration:  Fair  Memory:  WNL  Insight:    Good  Judgment:   Good  Impulse Control:  Good   Risk Assessment: Danger to Self: No Self-injurious Behavior: No Danger to Others: No Physical Aggression / Violence: No Duty to Warn: No Access to Firearms a concern: No  Assessment of progress:  progressing  Diagnosis:   ICD-10-CM   1. Bipolar I disorder (HCC)  F31.9     2. Generalized anxiety disorder  F41.1     3. Relationship problem between partners  Z63.0     4. History of posttraumatic stress disorder (PTSD)  Z86.59     5. 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 At risk for dental problems  Z91.89      Plan:  For possibly moving -- Assess needs for setting up housekeeping and creating new social circle and services in [place] in order to be ahead of any shock there may be.  As able, address decluttering and downsizing.  Consider offering some bulk sale of low-value toy inventory -- OR -- arranging for a storage container to pace the process while moving. For stress management  and distress tolerance -- Try stressful event journaling (what, how feel, what did, how turned out) and personal "menu" of things he can do to cope with distress.  Both should help make options more handy, break down sense of helplessness that may fuel anger.  Include ludicrous or taboo options to help legitimize choice. With Tristan [name] -- Keep working constructive agreement to work projects she has open, not create new ones.  Reward the better wherever possible, be as matter of fact as possible when assertiveness  is needed, and self-affirm it is OK to honestly answer "what's wrong" and let honest sadness speak rather than anger.  Open invitation to couples session(s). Household environment -- as able, reduce clutter and restore areas he can be in without having to isolate to hold down irritating sights Friendships -- Build on guys' outings to strengthen friendships Sleep regulation -- Address sleep hygiene and light control as needed Dental -- Follow through with planned and needed dental work as intended Other recommendations/advice as may be noted above Continue to utilize previously learned skills ad lib Maintain medication as prescribed and work faithfully with relevant prescriber(s) if any changes are desired or seem indicated Call the clinic on-call service, 988/hotline, 911, or present to [facility] or ER if any life-threatening psychiatric crisis Return in about 2 weeks (around 05/08/2022) for put on cancellation list if desired. Already scheduled visit in this office 05/15/2022.  [name], PhD [name], PhD LP Clinical Psychologist, [facility] [address] [place], [place] [address] [phone]

## 2022-05-15 ENCOUNTER — Ambulatory Visit (INDEPENDENT_AMBULATORY_CARE_PROVIDER_SITE_OTHER): Payer: 59 | Admitting: Psychiatry

## 2022-05-15 DIAGNOSIS — F40298 Other specified phobia: Secondary | ICD-10-CM

## 2022-05-15 DIAGNOSIS — Z8659 Personal history of other mental and behavioral disorders: Secondary | ICD-10-CM

## 2022-05-15 DIAGNOSIS — Z630 Problems in relationship with spouse or partner: Secondary | ICD-10-CM

## 2022-05-15 DIAGNOSIS — F319 Bipolar disorder, unspecified: Secondary | ICD-10-CM | POA: Diagnosis not present

## 2022-05-15 DIAGNOSIS — F99 Mental disorder, not otherwise specified: Secondary | ICD-10-CM

## 2022-05-15 DIAGNOSIS — F5105 Insomnia due to other mental disorder: Secondary | ICD-10-CM

## 2022-05-15 DIAGNOSIS — F411 Generalized anxiety disorder: Secondary | ICD-10-CM

## 2022-05-15 NOTE — Progress Notes (Signed)
Psychotherapy Progress Note [facility] [name], PhD LP  Patient ID: Tristan [name])    MRN: [id number] Therapy format: Individual psychotherapy Date: 05/15/2022      Start: 1:10p     Stop: 2:00p     Time Spent: 50 min Location: In-person   Session narrative (presenting needs, interim history, self-report of stressors and symptoms, applications of prior therapy, status changes, and interventions made in session) Tristan [name] took the job in [name], expected to report last week of October.  Tentative plan for Tristan [name] to remain here till mid November.  Says Tristan [name] had the wild idea they would drive down in an RV (to prevent flying the dogs), but has effectively redirected that, and they will drive more conventionally instead.  [place] will take care of the move, including realty on both ends.  Some pressure lately to get the news told to all their significant people, in some semblance of order, and hopefully hurt no feelings over who knew before who else or who had to find out through social media, etc.  Several times has been stunned to find out how very much he means to people in his life, one willing to fly out of his way to see him in [place].  In some cases, he is giving away or low-selling excess toys/inventory (e.g., bundles of Legos, lower-value action figures) and using the occasion of giving them a parting gift to tell them.  Sweet, thoughtful, and undoubtedly stunning to some to do so.  Find it healthy exercise reviewing his/their possessions to keep or purge, and despite understandable feelings of loss, he is finding a greater freedom in letting go, as well as a wave of moments being able to share things that are cool to him.  Rediscovering some beauties, too, like being able to open a window at the house that has been obstructed a long time.  Pressing Tristan [name] to contribute to daily progress purging and packing.  One heartbreaker in the mix is parting with dogs he walks  regularly on behalf of a working neighbor, who by now will run to his house instead of their own.  Discussed continuity of care.  Very much wants to continue working together, figuring telehealth would be available without conditions.  Oriented to jurisdiction and license issues, not known to him before, including the provision in most jurisdictions for a limited number of guest practitioner services without credentialing in state.  A cursory review online suggests [place] allows 30 days of service before having to apply for an in state license.  Agreed we can, for continuity and transition's sake, maintain touch long enough to support him reconnecting to a local therapist and manage the stresses of putting down new roots there.    Therapeutic modalities: Cognitive Behavioral Therapy, Solution-Oriented/Positive Psychology, and Ego-Supportive  Mental Status/Observations:  Appearance:   Casual     Behavior:  Appropriate  Motor:  Normal  Speech/Language:   Clear and Coherent  Affect:  Appropriate  Mood:  anxious and and hopeful  Thought process:  normal  Thought content:    WNL  Sensory/Perceptual disturbances:    WNL  Orientation:  Fully oriented  Attention:  Good    Concentration:  Good  Memory:  WNL  Insight:    Good  Judgment:   Good  Impulse Control:  Good   Risk Assessment: Danger to Self: No Self-injurious Behavior: No Danger to Others: No Physical Aggression / Violence: No Duty to Warn: No Access to Firearms  a concern: No  Assessment of progress:  progressing  Diagnosis:   ICD-10-CM   1. Generalized anxiety disorder  F41.1     2. History of posttraumatic stress disorder (PTSD)  Z86.59     3. Relationship problem between partners  Z63.0     4. Bipolar I disorder ([place])  F31.9     5. 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 Insomnia due to other mental disorder  F51.05    F99      Plan:  For moving -- Assess needs for setting up housekeeping and creating  new social circle and services in [place].  Continue decluttering and downsizing at discretion.  Work through Best boy of farewell notifications and trust that it won't be a catastrophe if rumor or social media beat him to the punch. For stress management and distress tolerance -- Continuing option to try stressful event journaling (what, how feel, what did, how turned out) and personal "menu" of things he can do to cope with distress.  Both should help make options more handy, break down sense of helplessness that may fuel anger.  Include ludicrous or taboo options to help legitimize sense of choice. With [place] -- Keep working constructive agreement to focus on projects at hand, not create new ones without agreement they are necessary.  Reward incremental improvements in self-restraint wherever possible, stay as matter of fact as possible when assertiveness is needed, and self-affirm it is OK to honestly answer "what's wrong" and trust her to hear honest sadness, in order to prevent tension/anger from taking center stage.  Open invitation to couples session(s). Sleep regulation -- Address sleep hygiene and light control as needed Dental -- Follow through with planned and needed dental work as intended Other recommendations/advice as may be noted above Continue to utilize previously learned skills ad lib Maintain medication as prescribed and work faithfully with relevant prescriber(s) if any changes are desired or seem indicated Call the clinic on-call service, 988/hotline, 911, or present to [facility] or ER if any life-threatening psychiatric crisis Return for put on cancellation list, needs teletherapy. Already scheduled visit in this office 06/06/2022.  Blanchie Serve, PhD Luan Moore, PhD LP Clinical Psychologist, [facility] [address] [place], [place] [address] [phone]

## 2022-05-29 ENCOUNTER — Ambulatory Visit (INDEPENDENT_AMBULATORY_CARE_PROVIDER_SITE_OTHER): Payer: 59 | Admitting: Psychiatry

## 2022-05-29 DIAGNOSIS — F99 Mental disorder, not otherwise specified: Secondary | ICD-10-CM

## 2022-05-29 DIAGNOSIS — Z630 Problems in relationship with spouse or partner: Secondary | ICD-10-CM | POA: Diagnosis not present

## 2022-05-29 DIAGNOSIS — F319 Bipolar disorder, unspecified: Secondary | ICD-10-CM | POA: Diagnosis not present

## 2022-05-29 DIAGNOSIS — F411 Generalized anxiety disorder: Secondary | ICD-10-CM

## 2022-05-29 DIAGNOSIS — Z8659 Personal history of other mental and behavioral disorders: Secondary | ICD-10-CM | POA: Diagnosis not present

## 2022-05-29 DIAGNOSIS — F5105 Insomnia due to other mental disorder: Secondary | ICD-10-CM

## 2022-05-29 NOTE — Progress Notes (Signed)
Psychotherapy Progress Note [facility] Luan Moore, PhD LP  Patient ID: KATRELL MILHORN)    MRN: [id number] Therapy format: Individual psychotherapy Date: 05/29/2022      Start: 2:10p     Stop: 3:00p     Time Spent: 50 min Location: In-person   Session narrative (presenting needs, interim history, self-report of stressors and symptoms, applications of prior therapy, status changes, and interventions made in session) Dealing with an abundance of spontaneously discovered tasks sorting/filing things as they pack to move.  Mutually agreed they get overwhelmed and need to exit the house now an then just to decompress.  Made his way through telling the people he wanted to  tell in person, and has informed sister, with strict instructions not to share it with alienated family ahead of time.  Neighbors are organizing a farewell, which is both validating and awkward.  Keeps finding out how loved they actually are.  Curious about guys trying not to show much emotions.  Noticing [name] coming out with new griefs she hadn't revealed, including a curious revelation she'll miss a guy [name] can't stand.   Finding himself crying from time to time, which is cleansing.  Amusing in part to see how easy it can be to downsize stuff and how difficult it is for guys to be vulnerable.  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  Still finds himself apt to second-guess his choices, nitpick himself about missed opportunities, for one.  Reframed as learning curve and ongoing adjustment out of learned self-criticism as well as perceived safety from expectations.  Forecast for [name] to be on the job in [place] end of the month.  He will need to come back and remain longer in order to finish out the house.  Plans have changed on the house now to still own and rent out, rather than delegate it to [employer] moving service, possibly take a loss,  and deny realtor friend the opportunity.  Discussed outlook for continuing vs. transitioning to local care in [place].  Therapeutic modalities: Cognitive Behavioral Therapy, Solution-Oriented/Positive Psychology, Ego-Supportive, and Humanistic/Existential  Mental Status/Observations:  Appearance:   Casual     Behavior:  Appropriate  Motor:  Normal  Speech/Language:   Clear and Coherent  Affect:  Appropriate  Mood:  Lighter, anxious  Thought process:  normal  Thought content:    WNL  Sensory/Perceptual disturbances:    WNL  Orientation:  Fully oriented  Attention:  Good    Concentration:  Good  Memory:  WNL  Insight:    Good  Judgment:   Good  Impulse Control:  Good   Risk Assessment: Danger to Self: No Self-injurious Behavior: No Danger to Others: No Physical Aggression / Violence: No Duty to Warn: No Access to Firearms a concern: No  Assessment of progress:  progressing  Diagnosis:   ICD-10-CM   1. Generalized anxiety disorder  F41.1     2. History of posttraumatic stress disorder (PTSD)  Z86.59     3. Relationship problem between partners  Z63.0     4. Bipolar I disorder ([place])  F31.9     5. Insomnia due to other mental disorder  F51.05    F99      Plan:  For moving -- Assess needs for setting up housekeeping and creating new social circle and services in [place].  Continue decluttering and downsizing at discretion.  Work through Best boy of farewell  notifications and trust that it won't be a catastrophe if rumor or social media beat him to the punch. For stress management and distress tolerance -- Continuing option to try stressful event journaling (what, how feel, what did, how turned out) and personal "menu" of things he can do to cope with distress.  Both should help make options more handy, break down sense of helplessness that may fuel anger.  Include ludicrous or taboo options to help legitimize sense of choice. With [place] -- Keep working constructive  agreement to focus on projects at hand, not create new ones without agreement they are necessary.  Reward incremental improvements in self-restraint wherever possible, stay as matter of fact as possible when assertiveness is needed, and self-affirm it is OK to honestly answer "what's wrong" and trust her to hear honest sadness, in order to prevent tension/anger from taking center stage.  Open invitation to couples session(s). Sleep regulation -- Address sleep hygiene and light control as needed Dental -- Follow through with planned and needed dental work as intended Other recommendations/advice as may be noted above Continue to utilize previously learned skills ad lib Maintain medication as prescribed and work faithfully with relevant prescriber(s) if any changes are desired or seem indicated Call the clinic on-call service, 988/hotline, 911, or present to [facility] or ER if any life-threatening psychiatric crisis Return for put on cancellation list, session(s) already scheduled. Already scheduled visit in this office 06/27/2022.  [name], PhD [name], PhD LP Clinical Psychologist, [facility] [address] [place], [place] [address] [phone]

## 2022-06-06 ENCOUNTER — Ambulatory Visit: Payer: 59 | Admitting: Psychiatry

## 2022-06-08 NOTE — Progress Notes (Incomplete)
Psychotherapy Progress Note [facility] Tristan Moore, PhD LP  Patient ID: Tristan [name])    MRN: [id number] Therapy format: Individual psychotherapy Date: 05/29/2022      Start: 2:10p     Stop: 3:00p     Time Spent: 50 min Location: In-person   Session narrative (presenting needs, interim history, self-report of stressors and symptoms, applications of prior therapy, status changes, and interventions made in session) Dealing with an abundance of spontaneously discovered tasks sorting/filing things as they pack to move.  Mutually agreed they get overwhelmed and need to exit the [name] now an then just to decompress.  Made his way through telling the people he wanted to  tell in person, and has informed sister, with strict instructions not to share it with alienataed family ahead of time.  Neighbors are organizaing a farewell, which is both validating and awkward.  Keeps finding out how loved they actually are.  Curious about guys trying not to show much emotions.  Noticing Tristan [name] coming out with new griefs she hadn't revealed, including a curious reveleation she'll miss a guy Tristan [name] can't stand.   Finding himself crying from time to time, which is cleansing.  Amusing in part to see how easy it can be to downsize stuff and how difficult it is for guys to be vulnerable.  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  Still finds himself apt to second-guess his choices, nitpick himself about missed opportunities, for one.  Reframed as learning curve and ongoing adjustment out of learned self-criticism as well as perceived safety from expectations.  Forecast for Tristan [name] to be on the job in [place] end of the month.  He will need to come back and remain longer in order to finish out the [name].  Plans have changed on the [name] now to still own and rent out, rather than delegate it to [employer] moving service, possibly take a  loss, and deny realtor friend the opportunity.  Therapeutic modalities: {AM:23362::"Cognitive Behavioral Therapy","Solution-Oriented/Positive Psychology"}  Mental Status/Observations:  Appearance:   {PSY:22683}     Behavior:  {PSY:21022743}  Motor:  {PSY:22302}  Speech/Language:   {PSY:22685}  Affect:  {PSY:22687}  Mood:  {PSY:31886}  Thought process:  {PSY:31888}  Thought content:    {PSY:[phone]}  Sensory/Perceptual disturbances:    {PSY:703 556 0620}  Orientation:  {Psych Orientation:23301::"Fully oriented"}  Attention:  {Good-Fair-Poor ratings:23770::"Good"}    Concentration:  {Good-Fair-Poor ratings:23770::"Good"}  Memory:  {PSY:[id number]}  Insight:    {Good-Fair-Poor ratings:23770::"Good"}  Judgment:   {Good-Fair-Poor ratings:23770::"Good"}  Impulse Control:  {Good-Fair-Poor ratings:23770::"Good"}   Risk Assessment: Danger to Self: {Risk:22599::"No"} Self-injurious Behavior: {Risk:22599::"No"} Danger to Others: {Risk:22599::"No"} Physical Aggression / Violence: {Risk:22599::"No"} Duty to Warn: {AMYesNo:22526::"No"} Access to Firearms a concern: {AMYesNo:22526::"No"}  Assessment of progress:  {Progress:22147::"progressing"}  Diagnosis: No diagnosis found. Plan:  *** Other recommendations/advice as may be noted above Continue to utilize previously learned skills ad lib Maintain medication as prescribed and work faithfully with relevant prescriber(s) if any changes are desired or seem indicated Call the clinic on-call service, 988/hotline, 911, or present to [facility] or ER if any life-threatening psychiatric crisis Return for put on cancellation list, session(s) already scheduled. Already scheduled visit in this office 06/27/2022.  Tristan Serve, PhD Tristan Moore, PhD LP Clinical Psychologist, [facility] [address] [place], [place] [address] [phone]

## 2022-06-09 ENCOUNTER — Ambulatory Visit (INDEPENDENT_AMBULATORY_CARE_PROVIDER_SITE_OTHER): Payer: 59 | Admitting: Psychiatry

## 2022-06-09 DIAGNOSIS — Z8659 Personal history of other mental and behavioral disorders: Secondary | ICD-10-CM

## 2022-06-09 DIAGNOSIS — G4700 Insomnia, unspecified: Secondary | ICD-10-CM

## 2022-06-09 DIAGNOSIS — Z630 Problems in relationship with spouse or partner: Secondary | ICD-10-CM

## 2022-06-09 DIAGNOSIS — F319 Bipolar disorder, unspecified: Secondary | ICD-10-CM | POA: Diagnosis not present

## 2022-06-09 DIAGNOSIS — F411 Generalized anxiety disorder: Secondary | ICD-10-CM | POA: Diagnosis not present

## 2022-06-09 NOTE — Progress Notes (Signed)
Psychotherapy Progress Note [facility] [name], PhD LP  Patient ID: DEMARKUS REMMEL)    MRN: [id number] Therapy format: Individual psychotherapy Date: 06/09/2022      Start: 11:13a     Stop: 12:01p     Time Spent: 48 min Location: In-person   Session narrative (presenting needs, interim history, self-report of stressors and symptoms, applications of prior therapy, status changes, and interventions made in session) A lot of upheaval last week.  Plan was/is for [name] to fly solo 10/29 and TG w/e to be the big move.  Have now discovered they should not have been packing but should let the moving company do that, or the insurance won't cover.  Also found out the company won't cover more than 2 pets, when they have 4.  Now the plan is for [name] to commute while [name] cares for the animals and continues the house here, and they do a more thorough search for suitable housing in [place] over a bit more time.  She has the idea she will fly out on Sundays and back on Wednesday late evenings, to [place], whereas he figures that will need to scale back to q o week just for all practical purposes, but [name] will probably have to run into it several times to figure that out.  Also a concentration of things she has to decide before she goes or they will fall on him, and make for irritability later.  Will also require a large and detailed assessment of items of high value for insurance.  The whole thing now evokes the chaos he felt when the house needed 8 months of repair/reno.  Undecided as well whether to hold and rent the house here or sell, but they did successfully get their preferred realtor involved, though it comes at a 40% cut in his commission and still involves a competitive process where strangers come through the house.  One silver lining lately finding parts he's been looking for for 3 weeks for a last art piece to make before he goes.  Finding himself harnessing his  attitude toward things he's leaving behind, decidedly being grateful for the landscapes and scenery and other things he's had here, preparing to be open to the new things they'll find in [place].  Saw his sister [name], the one desired family member, much closer in age to an aunt than a sister.  Tentative plans to come visit them in [place].  Part of the old family feud is [name] getting kicked out by father, and father swearing all the other kids to stand with him on it.  Later on, revealed that Homero Fellers and other sister had been preventing mail from her to mother in nursing home.  Discussed family ties and what is settled enough.  Goodbye event in the neighborhood yesterday, along with [name]'s birthday, near 20 people there who made a great outpouring of appreciation for both of them.  Continues to be uplifting to find out more what he's meant to people.  Therapeutic modalities: Cognitive Behavioral Therapy, Solution-Oriented/Positive Psychology, and Ego-Supportive  Mental Status/Observations:  Appearance:   Casual     Behavior:  Appropriate  Motor:  Normal  Speech/Language:   Clear and Coherent  Affect:  Appropriate  Mood:  anxious and dysthymic  Thought process:  normal  Thought content:    WNL  Sensory/Perceptual disturbances:    WNL  Orientation:  Fully oriented  Attention:  Good    Concentration:  Good  Memory:  WNL  Insight:    Good  Judgment:   Good  Impulse Control:  Good   Risk Assessment: Danger to Self: No Self-injurious Behavior: No Danger to Others: No Physical Aggression / Violence: No Duty to Warn: No Access to Firearms a concern: No  Assessment of progress:  progressing  Diagnosis:   ICD-10-CM   1. Generalized anxiety disorder  F41.1     2. Bipolar I disorder ([place])  F31.9     3. History of posttraumatic stress disorder (PTSD)  Z86.59     4. Relationship problem between partners  Z63.0     5. Insomnia, unspecified type  G47.00      Plan:  For moving -- Continue to  assess needs for setting up housekeeping and creating new social circle and services in [place].  Continue decluttering and downsizing at discretion.  Work through Best boy of farewell notifications. For stress management and distress tolerance -- Continuing option to try stressful event journaling (what, how feel, what did, how turned out) just to externalize and to create a personal "menu" of things he can do to cope with distress.  Both should help make options more handy, break down sense of helplessness that may fuel anger.  Include ludicrous or taboo options to help legitimize sense of choice. With [place] -- Keep working constructive agreement to focus on projects at hand, not create new ones without agreement they are necessary.  Stay as matter of fact as possible when assertiveness is needed, self-affirm it is OK to honestly answer "what's wrong" and trust her to hear out honest sadness, and reward incremental improvements in her self-restraint and compromising wherever possible.  Open invitation to couples session(s). Sleep regulation -- Address sleep hygiene and light control as needed Dental -- Follow through with planned and needed dental work as intended Other recommendations/advice as may be noted above Continue to utilize previously learned skills ad lib Maintain medication as prescribed and work faithfully with relevant prescriber(s) if any changes are desired or seem indicated Call the clinic on-call service, 988/hotline, 911, or present to [facility] or ER if any life-threatening psychiatric crisis Return for as already scheduled. Already scheduled visit in this office 06/27/2022.  Blanchie Serve, PhD Luan Moore, PhD LP Clinical Psychologist, [facility] [address], [place], [place] [address] [phone]

## 2022-06-27 ENCOUNTER — Ambulatory Visit (INDEPENDENT_AMBULATORY_CARE_PROVIDER_SITE_OTHER): Payer: 59 | Admitting: Psychiatry

## 2022-06-27 DIAGNOSIS — F319 Bipolar disorder, unspecified: Secondary | ICD-10-CM | POA: Diagnosis not present

## 2022-06-27 DIAGNOSIS — Z630 Problems in relationship with spouse or partner: Secondary | ICD-10-CM | POA: Diagnosis not present

## 2022-06-27 DIAGNOSIS — F411 Generalized anxiety disorder: Secondary | ICD-10-CM

## 2022-06-27 DIAGNOSIS — Z8659 Personal history of other mental and behavioral disorders: Secondary | ICD-10-CM | POA: Diagnosis not present

## 2022-06-27 NOTE — Progress Notes (Signed)
Psychotherapy Progress Note [facility] Luan Moore, PhD LP  Patient ID: DELMAS KALKOWSKI)    MRN: [id number] Therapy format: Individual psychotherapy Date: 06/27/2022      Start: 8:11a     Stop: 9:00a     Time Spent: 49 min Location: In-person   Session narrative (presenting needs, interim history, self-report of stressors and symptoms, applications of prior therapy, status changes, and interventions made in session) Overall, "My 'give a fuck' is broken".  Tired, waking up involuntarily, tired of driving, and having to decide whether to call off a couple of cherished toy shows.  Showed up erroneously yesterday.  The relocation process is grinding, partly for [name] trying to make a weekly commute now entailing 2 pressured round trips to [place], the "depleting" tasks he has taking care of the house, and seeing more how the current froth of issues and needs only continues to show [place] hopelessly compulsive in stringing herself out with added tasks and self-imposed imperatives, while tending to monologue at length -- est 45 minutes sometimes -- about hardships and stress.  Largely burnt out with [name]'s unrealistic expectations and overcommitting.    Realtor visits have been their own difficulty, with their friend Legrand Como assessing at healthy price and finding a short list of needs; then another realtor recommended by an untrusted acquaintance, grates on [name] already, and strongly suspected to not see the value of the place or the neighborhood, comes in with poor comps and values about $70K less.  3rd realtor very professional, helpful, came in about $30K less.  And the relocation contract with [facility] has restrictions that essentially force use of the middle estimate.  Worked through fantasies of holding onto the house a year as absentee owners and voiding the contract, and thankfully could choose to follow through with contractual representation.    Addressed  emotional perfectionism.  Found himself cursing a man at the bank, has been feeling horrible about that, though the fact is he immediately apologized and explained himself, and it prompted a warm, friendly exchange of the kind he stands for and wants to see make a cold world warmer.  On another up note, had another guys' gathering for local wrestling, something he wanted to pull off as community building and helping integrate other professed "nerds", and it was roundly enjoyed, with new professions of what a good friend [name] is and how he will be missed.  Current timeline, figures to be in town through [place].  Discussed scheduling and availability by phone if circumstances make it unworkable to be in office.  Therapeutic modalities: Cognitive Behavioral Therapy and Solution-Oriented/Positive Psychology  Mental Status/Observations:  Appearance:   Casual     Behavior:  Appropriate  Motor:  Normal  Speech/Language:   Clear and Coherent  Affect:  Appropriate  Mood:  irritable, sad, and responsive  Thought process:  normal  Thought content:    WNL  Sensory/Perceptual disturbances:    WNL  Orientation:  Fully oriented  Attention:  Good    Concentration:  Good  Memory:  WNL  Insight:    Good  Judgment:   Good  Impulse Control:  Good   Risk Assessment: Danger to Self: No Self-injurious Behavior: No Danger to Others: No Physical Aggression / Violence: No Duty to Warn: No Access to Firearms a concern: No  Assessment of progress:  stabilized  Diagnosis:   ICD-10-CM   1. Generalized anxiety disorder  F41.1     2. Relationship problem  between partners  Z63.0     3. Bipolar I disorder (HCC)  F31.9     4. History of posttraumatic stress disorder (PTSD)  Z86.59      Plan:  For moving -- Continue to assess needs for setting up housekeeping and creating new social circle and services in [place].  Continue decluttering and downsizing at discretion.  Work through Administrator, arts of  farewell notifications. For stress management and distress tolerance -- Continuing option to try stressful event journaling (what, how feel, what did, how turned out) just to externalize and to create a personal "menu" of things he can do to cope with distress.  Both should help make options more handy, break down sense of helplessness that may fuel anger.  Include ludicrous or taboo options to help legitimize sense of choice. With [place] -- Keep working constructive agreement to focus on projects at hand, not create new ones without agreement they are necessary.  Stay as matter of fact as possible when assertiveness is needed, self-affirm it is OK to honestly answer "what's wrong" and trust her to hear out honest sadness, and reward incremental improvements in her self-restraint and compromising wherever possible.  Open invitation to couples session(s). For self-esteem -- hold onto the affirmations given about being an excellent and cherished friend Sleep regulation -- Address sleep hygiene and light control as needed Dental -- Follow through with planned and needed dental work as intended Other recommendations/advice as may be noted above Continue to utilize previously learned skills ad lib Maintain medication as prescribed and work faithfully with relevant prescriber(s) if any changes are desired or seem indicated Call the clinic on-call service, 988/hotline, 911, or present to [facility] or ER if any life-threatening psychiatric crisis Return for as already scheduled. Already scheduled visit in this office 07/16/2022.  [name], PhD [name], PhD LP Clinical Psychologist, [facility] [address] [place], [place] [address] [phone]

## 2022-06-28 ENCOUNTER — Other Ambulatory Visit: Payer: Self-pay | Admitting: Physician Assistant

## 2022-07-16 ENCOUNTER — Encounter: Payer: Self-pay | Admitting: Physician Assistant

## 2022-07-16 ENCOUNTER — Ambulatory Visit (INDEPENDENT_AMBULATORY_CARE_PROVIDER_SITE_OTHER): Payer: 59 | Admitting: Physician Assistant

## 2022-07-16 DIAGNOSIS — Z634 Disappearance and death of family member: Secondary | ICD-10-CM | POA: Diagnosis not present

## 2022-07-16 DIAGNOSIS — F4323 Adjustment disorder with mixed anxiety and depressed mood: Secondary | ICD-10-CM | POA: Diagnosis not present

## 2022-07-16 DIAGNOSIS — F3162 Bipolar disorder, current episode mixed, moderate: Secondary | ICD-10-CM

## 2022-07-16 DIAGNOSIS — G4700 Insomnia, unspecified: Secondary | ICD-10-CM

## 2022-07-16 DIAGNOSIS — Z79899 Other long term (current) drug therapy: Secondary | ICD-10-CM

## 2022-07-16 DIAGNOSIS — F4310 Post-traumatic stress disorder, unspecified: Secondary | ICD-10-CM

## 2022-07-16 MED ORDER — OXCARBAZEPINE 600 MG PO TABS: ORAL_TABLET | ORAL | 3 refills | Status: AC

## 2022-07-16 MED ORDER — LAMOTRIGINE 200 MG PO TABS: 200.0000 mg | ORAL_TABLET | Freq: Two times a day (BID) | ORAL | 1 refills | Status: DC

## 2022-07-16 NOTE — Patient Instructions (Signed)
Increase the Trileptal 600 mg by taking 1.5 pills every morning and 1 pill every evening for 4 days and then increase to 1.5 pills twice a day. On the Lamictal were increasing the dose to 200 mg, 1 twice a day. All other medications are the same. Please get labs drawn at [facility] on December 7 or 8 if at all possible.  It is okay if you need to do it the week of December 11.

## 2022-07-16 NOTE — Progress Notes (Signed)
[facility]  Patient ID: Tristan [name],  MRN: [id number]  PCP: Patient, No Pcp Per  Date of Evaluation: 07/16/2022 time spent:30 minutes  Chief Complaint:  Chief Complaint   Anxiety; Depression; Insomnia; Follow-up    HISTORY/CURRENT STATUS: HPI not doing well.   A lot of changes. His wife got a job w/ [employer], and they're moving to [place]. [name] is commuting but he's still here, trying to deal with the moving company (they have to use a relocation company through her job or there things will not be insured) so that has been very stressful.  Also one of their beloved dogs died suddenly a few weeks ago.  He was alone when their dog got sick, he had to rush it to the vet and it died within 10 minutes of getting there.  That has been extremely hard.  He feels very sad, cries easily, even before all of this started happening his mood started to decline.  Having a hard time enjoying things.  Energy and motivation are low.  He is not sleeping well even with the Sonata and sometimes he combines it with either Valium or hydroxyzine and still has trouble falling asleep or staying asleep.  He feels tired a lot.  Appetite is the same.  Personal hygiene is normal.  ADLs are within normal limits.  No suicidal or homicidal thoughts.  Is much more irritable, has a short fuse and gets snappy with [name] and random people for no reason.  Is an aggressive driver, traffic is very difficult for him to deal with, he is having to drive back and forth to [place] twice a week to pick up and take [name] to the airport, so that is hard for him to deal with.  He does not think about taking the Valium or the hydroxyzine before a stressful situation.  Typically it is "too late" when he realizes he could take it and it would be helpful. Patient denies increased energy with decreased need for sleep, increased talkativeness, racing thoughts, increased spending, increased libido, grandiosity, paranoia, or  hallucinations.  Review of Systems  Constitutional: Negative.   HENT: Negative.    Eyes: Negative.   Respiratory: Negative.    Cardiovascular: Negative.   Gastrointestinal: Negative.   Genitourinary: Negative.   Musculoskeletal: Negative.   Skin: Negative.   Neurological: Negative.   Endo/Heme/Allergies: Negative.   Psychiatric/Behavioral:         See HPI   Denies dizziness, syncope, seizures, numbness, tingling, tremor, tics, unsteady gait, slurred speech, confusion. Denies muscle or joint pain, stiffness, or dystonia.  Individual Medical History/ Review of Systems: Changes? :No   Past medications for mental health diagnoses include: Celexa, Lamictal, Lexapro, Seroquel, prazosin, Trileptal, trazodone, Vistaril, Sonata.   Allergies: Patient has no known allergies.  Current Medications:  Current Outpatient Medications:    busPIRone (BUSPAR) 15 MG tablet, TAKE 3 TABLETS IN THE MORNING AND 2 TABLETS AT BEDTIME, Disp: 450 tablet, Rfl: 1   diazepam (VALIUM) 5 MG tablet, TAKE 1 TABLET BY MOUTH EVERY 8 HOURS AS NEEDED FOR ANXIETY, Disp: 90 tablet, Rfl: 1   hydrOXYzine (ATARAX) 25 MG tablet, TAKE 1 TO 2 TABLETS (25-50 MG TOTAL) BY MOUTH EVERY 6 HOURS AS NEEDED FOR ANXIETY, Disp: 720 tablet, Rfl: 0   lamoTRIgine (LAMICTAL) 200 MG tablet, Take 1 tablet (200 mg total) by mouth 2 (two) times daily., Disp: 180 tablet, Rfl: 1   zaleplon (SONATA) 10 MG capsule, 1 po qhs prn and may repeat  1 for midnocturnal awakening if has 3 hours left to sleep., Disp: 60 capsule, Rfl: 5   oxcarbazepine (TRILEPTAL) 600 MG tablet, 1.5 pills q am and 1 po qhs for 4 days, then increase to 1.5 pills bid., Disp: 270 tablet, Rfl: 3 Medication Side Effects: none  Family Medical/ Social History: Changes? See HPI   MENTAL HEALTH EXAM:  There were no vitals taken for this visit.There is no height or weight on file to calculate BMI.  General Appearance: Casual, Neat and Well Groomed  Eye Contact:  Good  Speech:   Clear and Coherent and Normal Rate  Volume:  Normal  Mood:  Depressed and Irritable  Affect:  Depressed and Tearful  Thought Process:  Goal Directed and Descriptions of Associations: Circumstantial  Orientation:  Full (Time, Place, and Person)  Thought Content: Logical   Suicidal Thoughts:  No  Homicidal Thoughts:  No  Memory:  WNL  Judgement:  Good  Insight:  Good  Psychomotor Activity:  Normal  Concentration:  Concentration: Good and Attention Span: Good  Recall:  Good  Fund of Knowledge: Good  Language: Good  Assets:  Desire for Improvement Financial Resources/Insurance Housing Transportation  ADL's:  Intact  Cognition: WNL  Prognosis:  Good   DIAGNOSES:    ICD-10-CM   1. Bipolar 1 disorder, mixed, moderate (HCC)  F31.62 Lamotrigine level    10-Hydroxycarbazepine    CBC with Differential/Platelet    Comprehensive metabolic panel    2. Adjustment disorder with mixed anxiety and depressed mood  F43.23     3. Insomnia, unspecified type  G47.00     4. Bereavement  Z63.4     5. PTSD (post-traumatic stress disorder)  F43.10     6. Encounter for long-term (current) use of medications  Z79.899 Lamotrigine level    10-Hydroxycarbazepine    CBC with Differential/Platelet    Comprehensive metabolic panel      Receiving Psychotherapy: Yes  Dr. Mardelle Matte [place]  RECOMMENDATIONS:  PDMP reviewed.  Valium filled 06/30/2022.  Sonata filled 06/28/2022. I provided 30 minutes of face to face time during this encounter, including time spent before and after the visit in records review, medical decision making, counseling pertinent to today's visit, and charting.   My condolences and the loss of his dog. I recommend increasing both Trileptal and Lamictal.  Explained the reasoning.  He agrees with this plan.  Also recommend obtaining labs in approximately 10 days from now. Recommend he go ahead and take Valium every morning right now whether he feels like he needs it or not.  I think  it will help prevent some of the worst anxiety.  I have no concerns of addiction with this plan, and when he no longer needs more coverage for anxiety then he can stop taking it routinely.  Continue Buspar 15 mg, 3 in am and 2 po qhs.. Cont Valium 5 mg po tid prn. Cont Hydroxyzine 25 mg, 1-2 po tid prn.  Increase Lamictal to 200 mg, 1 p.o. twice daily. Increase Trileptal 600 mg to 1.5 pills every morning and 1 p.o. nightly for 4 days, then increase to 1.5 pills p.o. twice daily. Cont Sonata 10 mg qhs prn sleep.  Cont therapy with Dr. [name]. Labs ordered as above. Return in 4 weeks.     Melony Overly, PA-C

## 2022-07-18 ENCOUNTER — Ambulatory Visit (INDEPENDENT_AMBULATORY_CARE_PROVIDER_SITE_OTHER): Payer: 59 | Admitting: Psychiatry

## 2022-07-18 DIAGNOSIS — F4323 Adjustment disorder with mixed anxiety and depressed mood: Secondary | ICD-10-CM

## 2022-07-18 DIAGNOSIS — F3162 Bipolar disorder, current episode mixed, moderate: Secondary | ICD-10-CM | POA: Diagnosis not present

## 2022-07-18 DIAGNOSIS — Z8659 Personal history of other mental and behavioral disorders: Secondary | ICD-10-CM

## 2022-07-18 DIAGNOSIS — F411 Generalized anxiety disorder: Secondary | ICD-10-CM

## 2022-07-18 DIAGNOSIS — G4700 Insomnia, unspecified: Secondary | ICD-10-CM

## 2022-07-18 DIAGNOSIS — Z630 Problems in relationship with spouse or partner: Secondary | ICD-10-CM

## 2022-07-18 NOTE — Progress Notes (Signed)
Psychotherapy Progress Note [facility] [name], PhD LP  Patient ID: Tristan [name])    MRN: [id number] Therapy format: Individual psychotherapy Date: 07/18/2022      Start: 8:15a     Stop: 9:05a     Time Spent: 50 min Location: In-person   Session narrative (presenting needs, interim history, self-report of stressors and symptoms, applications of prior therapy, status changes, and interventions made in session) "Shitty."  In addition to the great pressure imposed working out the move to [place], lost [place], a 49yo dachshund, longest-tenured member of the pack, the last one of Fritz's own pets, and the best-behaved member.  Involves partial-surprise downturn in health, occurring amid [name]'s irregular travel schedule, the long commute with [place] airport, and the frequent experience of feeling trapped in traffic, not to mention anxiety riding with [facility] when she drives, now on a twice-weekly basis.  Hopefully the commute is thinning down as [name] realizes how untenable it is to fly and be delivered to/from [place] twice a week.  Admits he's been coping in part by trying to drink before bed, but still finding DFA, and in the midst of this is when [place] had a wheezing episode, thought to be allergies, bit when reached for, [name] reacted regrettably, then emergency vet visit in which he stopped breathing at the clinic and [name] had to make the sudden decision to euthanize.  Able to grieve at home with the other animals.  [name] called in support, but in typical fashion filled the air with a lot of words to the effect of how bad she feels for him, which only felt suffocating.  Her company very graciously gave her bereavement leave for this, and help getting home quickly, but still, her way of comforting became suffocating, followed by her typically overcommitted approach to Thanksgiving cooking, and shortsighted efforts lobbying [name] about getting another dog.  All he  could do to politely refuse without blowing up.  To her credit, [name] did thoughtfully offer how it's possible that [place], in his own empathy, gave up the ghost to help clear the way for [name] to pull up stakes and move.  Importantly, he eventually got some clarifying conversation with [name] how much he's been suffering all year with her obsessions, and how they seem to abandon him on the one hand, flood/crowd him on the other.  Colorfully spoken, and not terribly clear she took his points correctly, but he got it said more directly and assertively.  Affirmed the effort.  Not clear how well it took, as they had some short-lived intimacy, then [name] was and back to being consumed with her phone.  Having fitting grief dreams lately of walking the dogs and [name] going off his own way.  Also finding himself more often vocalizing aloud, alone at home, both of which have worried him.  Assured both are normal and reframed as exercising some healthy emotional freedom, including freedom to grieve and letting himself out form under a long period of prolonged self-suppression to acknowledge his own feelings, even with himself as the only listener.  Encouraged in staying ready to ask [name] to slow down any rushes she gets into trying to salve his feelings (or salve her own thinking they are his, either one).   Therapeutic modalities: Cognitive Behavioral Therapy, Solution-Oriented/Positive Psychology, and Ego-Supportive  Mental Status/Observations:  Appearance:   Casual     Behavior:  Appropriate  Motor:  Normal  Speech/Language:   Clear and Coherent  Affect:  Appropriate  Mood:  dysthymic  Thought process:  normal  Thought content:    WNL  Sensory/Perceptual disturbances:    WNL  Orientation:  Fully oriented  Attention:  Good    Concentration:  Good  Memory:  WNL  Insight:    Good  Judgment:   Good  Impulse Control:  Good   Risk Assessment: Danger to Self: No Self-injurious Behavior: No Danger  to Others: No Physical Aggression / Violence: No Duty to Warn: No Access to Firearms a concern: No  Assessment of progress:  stabilized  Diagnosis:   ICD-10-CM   1. Generalized anxiety disorder  F41.1     2. Adjustment disorder with mixed anxiety and depressed mood  F43.23     3. Bipolar 1 disorder, mixed, moderate (HCC)  F31.62     4. Insomnia, unspecified type  G47.00     5. History of posttraumatic stress disorder (PTSD)  Z86.59     6. Relationship problem between partners  Z63.0      Plan:  For moving -- Continue to assess needs for setting up housekeeping and creating new social circle and services in [place].  Continue decluttering and downsizing at discretion.  Work through Administrator, arts of farewell notifications. For stress management and distress tolerance -- Continuing option to try stressful event journaling (what, how feel, what did, how turned out) just to externalize and to create a personal "menu" of things he can do to cope with distress.  Both should help make options more handy, break down sense of helplessness that may fuel anger.  Include ludicrous or taboo options to help legitimize sense of choice. With [place] -- Keep working constructive agreement to focus on projects at hand, not create new ones without agreement they are necessary.  Stay as matter of fact as possible when assertiveness is needed, self-affirm it is OK to honestly answer "what's wrong" and trust her to hear out honest sadness, and reward incremental improvements in her self-restraint and compromising wherever possible.  OK to keep asking her attention to when she is either flooding or abandoning and ask her to do differently.  Notice resentment trying to brew and turn it to constructive action to either care for self or ask behavior.  Open invitation to couples session(s). For self-esteem and anxiety about coping -- hold onto the affirmations given about being an excellent and cherished friend,  self-affirm normal variations on grieving and finding his voice Sleep regulation -- Address sleep hygiene and light control as needed Dental -- Follow through with planned and needed dental work as intended Substances -- Maintain control over urges to self-medicate with alcohol or THC, try to apply nonchemical soothing wherever possible and assert as needed Other recommendations/advice as may be noted above Continue to utilize previously learned skills ad lib Maintain medication as prescribed and work faithfully with relevant prescriber(s) if any changes are desired or seem indicated Call the clinic on-call service, 988/hotline, 911, or present to [facility] or ER if any life-threatening psychiatric crisis Return for as already scheduled. Already scheduled visit in this office 08/01/2022.  [name], PhD [name], PhD LP Clinical Psychologist, [facility] [address] [place], [place] [address] [phone]

## 2022-07-28 ENCOUNTER — Encounter: Payer: Self-pay | Admitting: Physician Assistant

## 2022-07-31 NOTE — Progress Notes (Signed)
Holding for all labs. No action needed at this point.

## 2022-08-01 ENCOUNTER — Ambulatory Visit (INDEPENDENT_AMBULATORY_CARE_PROVIDER_SITE_OTHER): Payer: 59 | Admitting: Psychiatry

## 2022-08-01 DIAGNOSIS — F4323 Adjustment disorder with mixed anxiety and depressed mood: Secondary | ICD-10-CM

## 2022-08-01 DIAGNOSIS — Z634 Disappearance and death of family member: Secondary | ICD-10-CM

## 2022-08-01 DIAGNOSIS — Z630 Problems in relationship with spouse or partner: Secondary | ICD-10-CM

## 2022-08-01 DIAGNOSIS — Z8659 Personal history of other mental and behavioral disorders: Secondary | ICD-10-CM

## 2022-08-01 DIAGNOSIS — F3162 Bipolar disorder, current episode mixed, moderate: Secondary | ICD-10-CM

## 2022-08-01 DIAGNOSIS — F411 Generalized anxiety disorder: Secondary | ICD-10-CM | POA: Diagnosis not present

## 2022-08-01 LAB — COMPREHENSIVE METABOLIC PANEL
ALT: 23 IU/L (ref 0–44)
AST: 21 IU/L (ref 0–40)
Albumin/Globulin Ratio: 2 (ref 1.2–2.2)
Albumin: 4.9 g/dL (ref 4.1–5.1)
Alkaline Phosphatase: 64 IU/L (ref 44–121)
BUN/Creatinine Ratio: 18 (ref 9–20)
BUN: 20 mg/dL (ref 6–24)
Bilirubin Total: <0.2 mg/dL (ref 0.0–1.2)
CO2: 24 mmol/L (ref 20–29)
Calcium: 10.1 mg/dL (ref 8.7–10.2)
Chloride: 99 mmol/L (ref 96–106)
Creatinine, Ser: 1.12 mg/dL (ref 0.76–1.27)
Globulin, Total: 2.5 g/dL (ref 1.5–4.5)
Glucose: 82 mg/dL (ref 70–99)
Potassium: 4.8 mmol/L (ref 3.5–5.2)
Sodium: 141 mmol/L (ref 134–144)
Total Protein: 7.4 g/dL (ref 6.0–8.5)
eGFR: 81 mL/min/{1.73_m2} (ref >59)

## 2022-08-01 LAB — CBC WITH DIFFERENTIAL/PLATELET
Basophils Absolute: 0.1 10*3/uL (ref 0.0–0.2)
Basos: 1 %
EOS (ABSOLUTE): 0.2 10*3/uL (ref 0.0–0.4)
Eos: 4 %
Hematocrit: 47.3 % (ref 37.5–51.0)
Hemoglobin: 15.3 g/dL (ref 13.0–17.7)
Immature Grans (Abs): 0 10*3/uL (ref 0.0–0.1)
Immature Granulocytes: 0 %
Lymphocytes Absolute: 1.9 10*3/uL (ref 0.7–3.1)
Lymphs: 31 %
MCH: 28.8 pg (ref 26.6–33.0)
MCHC: 32.3 g/dL (ref 31.5–35.7)
MCV: 89 fL (ref 79–97)
Monocytes Absolute: 0.7 10*3/uL (ref 0.1–0.9)
Monocytes: 11 %
Neutrophils Absolute: 3.2 10*3/uL (ref 1.4–7.0)
Neutrophils: 53 %
Platelets: 337 10*3/uL (ref 150–450)
RBC: 5.31 x10E6/uL (ref 4.14–5.80)
RDW: 13.7 % (ref 11.6–15.4)
WBC: 6.1 10*3/uL (ref 3.4–10.8)

## 2022-08-01 LAB — 10-HYDROXYCARBAZEPINE: Oxcarbazepine SerPl-Mcnc: 25 ug/mL (ref 10–35)

## 2022-08-01 LAB — LAMOTRIGINE LEVEL: Lamotrigine Lvl: 4.8 ug/mL (ref 2.0–20.0)

## 2022-08-01 NOTE — Progress Notes (Signed)
Mailbox is full.

## 2022-08-01 NOTE — Progress Notes (Signed)
Psychotherapy Progress Note [facility] [name], PhD LP  Patient ID: BRANDONN CAPELLI)    MRN: [id number] Therapy format: Individual psychotherapy Date: 08/01/2022      Start: 8:15a     Stop: 9:03a     Time Spent: 48 min Location: In-person   Session narrative (presenting needs, interim history, self-report of stressors and symptoms, applications of prior therapy, status changes, and interventions made in session) The move process keeps stretching out.  Another dispute with [name] taking his consent to fly down and join her for dept Xmas party in [place] as a commitment to go look at houses all weekend nonstop.  Dispute not resolved about her conclusion-jumping until she discovered the unavailability of dogsitters.  New wrinkle with home appraiser coming in low, with [name] panicking and more rounds of effectively hypnotizing herself with worry until badly the point, presenting [name] with repeated greased-pig scenarios trying to get her to hear something other than what her panic or fantasies tell her.  Admittedly not good yet at switching to active listening mode (ask her what the rush is, essentially).  Credibly, some progress reviewing houses to buy, though.  Mostly so flooded with how new challenges get created beyond the ones he knew to expect, not the least of which is [place] in a foreign [place] effectively alone but for his complicated friend, [name].  Aware of an emotional resonance between [place] coming home, from work travel, in a Engineer, civil (consulting), and the childhood memory of his father coming home, from work travel, and introducing anxiety into the home.  Has shared with betsy though not sure what benefit can be had, just hopeful she can be more sensitive to the moment one way or the other.  Encouraged in tactics to get her to slow down when flooding him, be flexible whether he tried to explain or can ask her what the pressure has to be.  Encouraged to migrate  language in conflict, referencing "my anxiety" and "your anxiety" to try to help establish that the problem is not [name] herself and the problems don't have to be personally shaming, but all about how emotions not coped well drive decisions and behavior that keep stressful cycles running.  Took up the issue of road rage, anticipated with tomorrow's travel, offered options to detach and recontextualize -- imagine seeing the traffic jam from above, make a game of counting "idiots" instead of grudging on what they do, other ideas.  One positive seeing a new Godzilla movie with a friend, the movie not so much, but how he did kindness under duress salvaging a bad moment inadvertently annoying a fellow viewer, harnessing his own anxiety and potential irritability to interact a a fellow fan outside after the movie.  Another victory in finishing 2 art pieces and sharing them, after 5 or 6 years imagined, with a new offer to put one in an important show and an another offer to buy.  Therapeutic modalities: Cognitive Behavioral Therapy, Solution-Oriented/Positive Psychology, Ego-Supportive, Humanistic/Existential, and Assertiveness/Communication  Mental Status/Observations:  Appearance:   Casual     Behavior:  Appropriate  Motor:  Normal  Speech/Language:   Clear and Coherent  Affect:  Appropriate  Mood:  anxious and dysthymic  Thought process:  normal  Thought content:    WNL  Sensory/Perceptual disturbances:    WNL  Orientation:  Fully oriented  Attention:  Good    Concentration:  Good  Memory:  WNL  Insight:    Good  Judgment:  Good  Impulse Control:  Good   Risk Assessment: Danger to Self: No Self-injurious Behavior: No Danger to Others: No Physical Aggression / Violence: No Duty to Warn: No Access to Firearms a concern: No  Assessment of progress:  progressing  Diagnosis:   ICD-10-CM   1. Generalized anxiety disorder  F41.1     2. Adjustment disorder with mixed anxiety and depressed  mood  F43.23     3. Bipolar 1 disorder, mixed, moderate (HCC)  F31.62     4. History of posttraumatic stress disorder (PTSD)  Z86.59     5. Relationship problem between partners  Z63.0     6. Bereavement (pets)  Z63.4      Plan:  With [place] -- Keep working constructive agreement to focus on projects at hand, not create new ones without agreement they are necessary.  Stay as matter of fact as possible when assertiveness is needed, self-affirm it is OK to honestly answer "what's wrong" and trust her to hear out honest sadness, and reward incremental improvements in her self-restraint and compromising wherever possible.  OK to keep asking her attention to when she is either flooding or abandoning and ask her to do differently.  Notice resentment trying to brew and turn it to constructive action to either care for self or ask behavior.  Open invitation to couples session(s). For moving -- Continue to assess needs for setting up housekeeping and creating new social circle and services in [place].  Continue decluttering and downsizing at discretion.  Work through Administrator, arts of farewell notifications. For stress management and distress tolerance -- Continuing option to try stressful event journaling (what, how feel, what did, how turned out) just to externalize and to create a personal "menu" of things he can do to cope with distress.  Both should help make options more handy, break down sense of helplessness that may fuel anger.  Include ludicrous or taboo options to help legitimize sense of choice.  Use tips for recontextualizing road-rage temptations. For self-esteem and anxiety about coping, anger -- hold onto the affirmations given about being an excellent and cherished friend, self-affirm normal variations on grieving and finding his voice Sleep regulation -- Address sleep hygiene and light control as needed Dental -- Follow through with planned and needed dental work as intended Substances --  Maintain control over urges to self-medicate with alcohol or THC, try to apply nonchemical soothing wherever possible and assert as needed Other recommendations/advice as may be noted above Continue to utilize previously learned skills ad lib Maintain medication as prescribed and work faithfully with relevant prescriber(s) if any changes are desired or seem indicated Call the clinic on-call service, 988/hotline, 911, or present to [facility] or ER if any life-threatening psychiatric crisis Return for as already scheduled. Already scheduled visit in this office 08/15/2022.  [name], PhD [name], PhD LP Clinical Psychologist, [facility] [address], [address] [place], [place] [address] [phone]

## 2022-08-01 NOTE — Progress Notes (Signed)
Please let him know the Lamictal level is good, no change in dose. Trileptal level is also in a good range.  No change in dose there either. Thank you.

## 2022-08-06 ENCOUNTER — Ambulatory Visit (INDEPENDENT_AMBULATORY_CARE_PROVIDER_SITE_OTHER): Payer: 59 | Admitting: Psychiatry

## 2022-08-06 DIAGNOSIS — Z8659 Personal history of other mental and behavioral disorders: Secondary | ICD-10-CM | POA: Diagnosis not present

## 2022-08-06 DIAGNOSIS — F3162 Bipolar disorder, current episode mixed, moderate: Secondary | ICD-10-CM

## 2022-08-06 DIAGNOSIS — F411 Generalized anxiety disorder: Secondary | ICD-10-CM | POA: Diagnosis not present

## 2022-08-06 DIAGNOSIS — F4323 Adjustment disorder with mixed anxiety and depressed mood: Secondary | ICD-10-CM

## 2022-08-06 DIAGNOSIS — Z634 Disappearance and death of family member: Secondary | ICD-10-CM

## 2022-08-06 DIAGNOSIS — Z630 Problems in relationship with spouse or partner: Secondary | ICD-10-CM

## 2022-08-06 NOTE — Progress Notes (Signed)
Psychotherapy Progress Note [facility] [name], PhD LP  Patient ID: [name])    MRN: [id number] Therapy format: Individual psychotherapy Date: 08/06/2022      Start: 11:20a     Stop: 12:10p     Time Spent: 50 min Location: In-person   Session narrative (presenting needs, interim history, self-report of stressors and symptoms, applications of prior therapy, status changes, and interventions made in session) Tristan [name], flying back both of them tomorrow to look at the leading candidate in houses in [place].  It's getting more real now, relocating after entire life in [place].  Exercising his discretion not to go ahead and meet her office mates in [place] upon landing, which would simultaneously overstuff the schedule, put him on the spot, and possibly embarrass Tristan [name] between his travel clothes and any reactions he may have if too pressured.    Continues difficult, so soon after the death of dog Tristan [name], and now dog Tristan [name] is showing signs of congestion, with the prospect of being away 36 hrs.  The one evening Tristan [name] just about ran Tristan [name] ragged with her obsessive worry aloud about whether to take Tristan [name] to the vet, which ended up spoiling his sleep further, missing a few hours just for being agitated about it all.  Worked through his anger, ultimately, though grueling, and was able to communicate more than in the past, orienting her better to the unintended side effects of worrying aloud the way she does.  Affirmed growth in being able to bring it up more explicitly and to teach, rather than stuff and resent.  Also proposed that it probably showed to [name] how, in speaking up, he was trusting her to process.   Got some more validating feedback from an acquaintance in the comic shop world, being told he'll be especially missed, wanting a followup piece to a piece he made, and credibly proposing to visit in [place].    Re. housing search, does feel they have a  couple strong choices, and he would like to make sur they get a nice meal in the neighborhood, both to help establish a positive attachment to the area and to work in the experience of a little leisure amid the hectic business of househunting.  Going forward, does trust he can bring it up to [place] if he sees a dealbreaker or a strong need with house or neighborhood.   Affirmed and encouraged.  Therapeutic modalities: Cognitive Behavioral Therapy, Solution-Oriented/Positive Psychology, Ego-Supportive, Humanistic/Existential, and Assertiveness/Communication  Mental Status/Observations:  Appearance:   Casual     Behavior:  Appropriate  Motor:  Normal  Speech/Language:   Clear and Coherent  Affect:  Appropriate  Mood:  anxious and dysthymic  Thought process:  normal  Thought content:    WNL  Sensory/Perceptual disturbances:    WNL  Orientation:  Fully oriented  Attention:  Good    Concentration:  Good  Memory:  WNL  Insight:    Good  Judgment:   Good  Impulse Control:  Good   Risk Assessment: Danger to Self: No Self-injurious Behavior: No Danger to Others: No Physical Aggression / Violence: No Duty to Warn: No Access to Firearms a concern: No  Assessment of progress:  stabilized  Diagnosis:   ICD-10-CM   1. Generalized anxiety disorder  F41.1     2. Adjustment disorder with mixed anxiety and depressed mood  F43.23     3. Bipolar 1 disorder, mixed, moderate (HCC)  F31.62  4. History of posttraumatic stress disorder (PTSD)  Z86.59     5. Relationship problem between partners  Z63.0     6. Bereavement (pets)  Z63.4      Plan:  With [place] -- Keep working constructive agreement to focus on projects at hand, not create new ones without agreement they are necessary.  Stay as matter of fact as possible when assertiveness is needed, self-affirm it is OK to honestly answer "what's wrong" and trust her to hear out honest sadness, and reward incremental improvements in her  self-restraint and compromising wherever possible.  OK to keep asking her attention to when she is either flooding or abandoning and ask her to do differently.  Notice resentment trying to brew and turn it to constructive action to either care for self, ask behavior, or ask a break to collect himself.  Remember assertiveness is a vote of confidence to a worrisome partner, too, even if he has to shepherd the process and her understandings.  Open invitation to couples session(s). For moving -- Continue to assess needs for setting up housekeeping and creating new social circle and services in [place].  Continue decluttering and downsizing at discretion.  Work through Administrator, arts of farewell notifications. For stress management and distress tolerance -- Continuing option to try stressful event journaling (what, how feel, what did, how turned out) just to externalize and to create a personal "menu" of things he can do to cope with distress.  Both should help make options more handy, break down sense of helplessness that may fuel anger.  Include ludicrous or taboo options to help legitimize sense of choice.  Use tips for recontextualizing road-rage temptations. For self-esteem and anxiety about coping, anger -- hold onto the affirmations given about being an excellent and cherished friend, self-affirm normal variations on grieving and finding his voice Sleep regulation -- Address sleep hygiene and light control as needed Dental -- Follow through with planned and needed dental work as intended Substances -- Maintain control over urges to self-medicate with alcohol or THC, try to apply nonchemical soothing wherever possible and assert as needed Other recommendations/advice as may be noted above Continue to utilize previously learned skills ad lib Maintain medication as prescribed and work faithfully with relevant prescriber(s) if any changes are desired or seem indicated Call the clinic on-call service,  988/hotline, 911, or present to [facility] or ER if any life-threatening psychiatric crisis Return for as already scheduled. Already scheduled visit in this office 08/15/2022.  [name], PhD [name], PhD LP Clinical Psychologist, [facility] [address], [address] [place], [place] [address] [phone]

## 2022-08-15 ENCOUNTER — Encounter: Payer: Self-pay | Admitting: Physician Assistant

## 2022-08-15 ENCOUNTER — Ambulatory Visit (INDEPENDENT_AMBULATORY_CARE_PROVIDER_SITE_OTHER): Payer: 59 | Admitting: Psychiatry

## 2022-08-15 ENCOUNTER — Ambulatory Visit (INDEPENDENT_AMBULATORY_CARE_PROVIDER_SITE_OTHER): Payer: No Typology Code available for payment source | Admitting: Physician Assistant

## 2022-08-15 DIAGNOSIS — F4323 Adjustment disorder with mixed anxiety and depressed mood: Secondary | ICD-10-CM

## 2022-08-15 DIAGNOSIS — F99 Mental disorder, not otherwise specified: Secondary | ICD-10-CM

## 2022-08-15 DIAGNOSIS — F411 Generalized anxiety disorder: Secondary | ICD-10-CM

## 2022-08-15 DIAGNOSIS — Z8659 Personal history of other mental and behavioral disorders: Secondary | ICD-10-CM | POA: Diagnosis not present

## 2022-08-15 DIAGNOSIS — F3162 Bipolar disorder, current episode mixed, moderate: Secondary | ICD-10-CM

## 2022-08-15 DIAGNOSIS — Z634 Disappearance and death of family member: Secondary | ICD-10-CM

## 2022-08-15 DIAGNOSIS — F5105 Insomnia due to other mental disorder: Secondary | ICD-10-CM | POA: Diagnosis not present

## 2022-08-15 DIAGNOSIS — Z630 Problems in relationship with spouse or partner: Secondary | ICD-10-CM

## 2022-08-15 MED ORDER — HYDROXYZINE HCL 25 MG PO TABS: ORAL_TABLET | ORAL | 0 refills | Status: DC

## 2022-08-15 MED ORDER — ZALEPLON 10 MG PO CAPS: ORAL_CAPSULE | ORAL | 5 refills | Status: DC

## 2022-08-15 NOTE — Progress Notes (Signed)
Psychotherapy Progress Note [facility] [name], PhD LP  Patient ID: Tristan [name])    MRN: [id number] Therapy format: Individual psychotherapy Date: 08/15/2022      Start: 8:16a     Stop: 9:06a     Time Spent: 50 min Location: Telehealth visit -- I connected with this patient by an approved telecommunication method (audio only), with his informed consent, and verifying identity and patient privacy.  I was located at my office and patient at his home.  As needed, we discussed the limitations, risks, and security and privacy concerns associated with telehealth service, including the availability and conditions which currently govern in-person appointments and the possibility that 3rd-party payment may not be fully guaranteed and he may be responsible for charges.  After he indicated understanding, we proceeded with the session.  Also discussed treatment planning, as needed, including ongoing verbal agreement with the plan, the opportunity to ask and answer all questions, his demonstrated understanding of instructions, and his readiness to call the office should symptoms worsen or he feels he is in a crisis state and needs more immediate and tangible assistance.   Session narrative (presenting needs, interim history, self-report of stressors and symptoms, applications of prior therapy, status changes, and interventions made in session) Car trouble this morning, but previously arranged for phone service in c/o need.  Made the plane trip as planned last week, packed flight, unable to sit together.  Got firsthand looks ata 1st two house choices, both felt lackluster to him (one good potential, needs work, the other move-in ready but limited lot and layout).  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  Mad scramble to get out in time to prevent getting stuck trying to fly standby in post-holiday  traffic.  Did enjoy the AirBnB, which was stocked with art toys and art books.  Once home, [name] ran all the numbers and preferences on houses, sent an offer together $70K under asking, which was flatly declined.  Learned that the seller is regarded as unrealistic, but [name] got it in her head to make a second offer and write what she thought would be a persuasive letter; [name] able to get her to hold sending a couple days to consider her messaging.  Again a flat no, which set [name] off wondering whether the realtor delivered her letter (but basically, just a hard audience, and [name] crestfallen to find out her fantasy of being persuasive wouldn't work.    Now she is heavily involved in re-searching houses, multitasking all day between work and househunting with 4 screens on, and engaging him spontaneously as if he was in on the thought process already and hanging on house-finding just as hard as she is.  Has tried to address her as discussed to query what her urgency is and offer to her how it's OK to take a break.  Got out of her that she is suffering for the travel and the being apart so much, and that's what's driving her.  Seems a bit disingenuous in light of all the isolating there's been this year.  Discussed messaging, recommending try to put it most in terms of sensibly allocating her energy to rest and togetherness, too, but failing that to see if she'll take the point of diminishing returns, i.e., that the more she burns on the task, the less her brain can handle comprehending, remembering, and comparing.  Failing  all persuasive arguments, he can at least set his own "office hours" to be in conversation about it, so it does not become the problem of being helplessly dragged through her urgent thought process without recourse.  An hour in the evening to go over the day's housing search considerations, and her making notes to bring to the business meeting, could be structured and reassuring to  both.  Meanwhile, was having trouble getting his [employer] piece shipped for the show, and the only right box he could find is one she had already packed; that entailed the emotional abrasion of her second-guessing him and offering several unsuitable boxes, then the emotional risk of unpacking it without her blessing, then the emotional confrontation she had opening it herself, eventually turned to her throwing the box at him and going silent the rest of the day.  After she slept on it and cooled, turned out when she opened the box in question, things that were precious to her were already broken.  Makes more sense, still trying to travel light and not taking the bait to fight, but it does bring up the prospect of her reopening a pile of boxes and creating new rounds of grief, irritability, shame (at finding how badly she packed), and the obtrusiveness of throwing things open.  Agreed to approach her about setting intentions how to deal with the pain and disappointment to come   Therapeutic modalities: Cognitive Behavioral Therapy, Solution-Oriented/Positive Psychology, Ego-Supportive, Humanistic/Existential, and Assertiveness/Communication  Mental Status/Observations:  Appearance:   Not assessed  Behavior:  Appropriate  Motor:  Not assessed  Speech/Language:   Clear and Coherent  Affect:  Not assessed  Mood:  anxious and dysthymic  Thought process:  normal  Thought content:    WNL  Sensory/Perceptual disturbances:    WNL  Orientation:  Fully oriented  Attention:  Good    Concentration:  Good  Memory:  WNL  Insight:    Good  Judgment:   Good  Impulse Control:  Good   Risk Assessment: Danger to Self: No Self-injurious Behavior: No Danger to Others: No Physical Aggression / Violence: No Duty to Warn: No Access to Firearms a concern: No  Assessment of progress:  stabilized  Diagnosis:   ICD-10-CM   1. Generalized anxiety disorder  F41.1     2. Adjustment disorder with mixed  anxiety and depressed mood  F43.23     3. Bipolar 1 disorder, mixed, moderate (HCC)  F31.62     4. History of posttraumatic stress disorder (PTSD)  Z86.59     5. Relationship problem between partners  Z63.0     6. Bereavement (pets)  Z63.4      Plan:  With [place] -- Keep working constructive agreement to focus on projects at hand, not create new ones without agreement they are necessary.  Stay as matter of fact as possible when assertiveness is needed, self-affirm it is OK to honestly answer "what's wrong" and trust her to hear out honest sadness, and reward incremental improvements in her self-restraint and compromising wherever possible.  OK to keep asking her attention to when she is either flooding or abandoning and ask her to do differently.  Notice resentment trying to brew and turn it to constructive action to either care for self, ask behavior, or ask a break to collect himself.  Remember assertiveness is a vote of confidence to a worrisome partner, too, even if he has to shepherd the process and her understandings.  Seek to make agreements  on how they want to handle stress/anxiety/disappointment to come, before being under the gun with events and moods.  Try to structure when to engage details, conflict, stressful listening, and participation in things he considers irrational.  Open invitation to couples session(s). For moving -- Continue to assess needs for setting up housekeeping and creating new social circle and services in [place].  Continue decluttering and downsizing at discretion.  Work through Administrator, arts of farewell notifications.  Available to consult on finding local services in greater [place]. For stress management and distress tolerance -- Continuing option to try stressful event journaling (what, how feel, what did, how turned out) just to externalize and to create a personal "menu" of things he can do to cope with distress.  Both should help make options more handy, break  down sense of helplessness that may fuel anger.  Include ludicrous or taboo options to help legitimize sense of choice.  Use tips for recontextualizing road-rage temptations. For self-esteem and anxiety about coping, anger -- hold onto the affirmations given about being an excellent and cherished friend, self-affirm normal variations on grieving and finding his voice Sleep regulation -- Address sleep hygiene and light control as needed Dental -- Follow through with planned and needed dental work as intended Substances -- Maintain control over urges to self-medicate with alcohol or THC, try to apply nonchemical soothing wherever possible and assert as needed Other recommendations/advice as may be noted above Continue to utilize previously learned skills ad lib Maintain medication as prescribed and work faithfully with relevant prescriber(s) if any changes are desired or seem indicated Call the clinic on-call service, 988/hotline, 911, or present to [facility] or ER if any life-threatening psychiatric crisis Return for time as available, avail earlier @ PT's need. Already scheduled visit in this office 08/15/2022.  [name], PhD [name], PhD LP Clinical Psychologist, [facility] [address], [address] [place], [place] [address] [phone]

## 2022-08-15 NOTE — Progress Notes (Signed)
[facility]  Patient ID: Tristan [name],  MRN: [id number]  PCP: Patient, No Pcp Per  Date of Evaluation: 08/15/2022 time spent:20 minutes  Chief Complaint:  Chief Complaint   Anxiety; Depression; Follow-up    HISTORY/CURRENT STATUS: HPI  for 1 month f/u.  A month ago we increased the Trileptal and Lamictal.  He is not as irritable or having such a short fuse like he was last month.  The only way he can really tell it is because he had to fly on December 21 and 22 and he did not get as aggravated in the airports as he likely would have before making the change of medications.  He still feels down.  But a lot of the way he is feeling is due to the fact that he and his wife are still in limbo about the move to [place].  Still have not found a home there. Is discouraged but no worse. ADLs and personal hygiene are nl. Appetite is nl and wt is stable. No SI/HI.   Still has anxiety at times, he is taking either the hydroxyzine or Valium usually only once a day if then.  He is sleeping fine.  Does need the Sonata though.  Patient denies increased energy with decreased need for sleep, increased talkativeness, racing thoughts, impulsivity or risky behaviors, increased spending, increased libido, grandiosity, paranoia, or hallucinations.  Denies dizziness, syncope, seizures, numbness, tingling, tremor, tics, unsteady gait, slurred speech, confusion. Denies muscle or joint pain, stiffness, or dystonia.  Individual Medical History/ Review of Systems: Changes? :No   Past medications for mental health diagnoses include: Celexa, Lamictal, Lexapro, Seroquel, prazosin, Trileptal, trazodone, Vistaril, Sonata.   Allergies: Patient has no known allergies.  Current Medications:  Current Outpatient Medications:    busPIRone (BUSPAR) 15 MG tablet, TAKE 3 TABLETS IN THE MORNING AND 2 TABLETS AT BEDTIME, Disp: 450 tablet, Rfl: 1   diazepam (VALIUM) 5 MG tablet, TAKE 1 TABLET BY MOUTH EVERY 8  HOURS AS NEEDED FOR ANXIETY, Disp: 90 tablet, Rfl: 1   lamoTRIgine (LAMICTAL) 200 MG tablet, Take 1 tablet (200 mg total) by mouth 2 (two) times daily., Disp: 180 tablet, Rfl: 1   oxcarbazepine (TRILEPTAL) 600 MG tablet, 1.5 pills q am and 1 po qhs for 4 days, then increase to 1.5 pills bid., Disp: 270 tablet, Rfl: 3   hydrOXYzine (ATARAX) 25 MG tablet, TAKE 1 TO 2 TABLETS (25-50 MG TOTAL) BY MOUTH EVERY 6 HOURS AS NEEDED FOR ANXIETY, Disp: 360 tablet, Rfl: 0   zaleplon (SONATA) 10 MG capsule, 1 po qhs prn and may repeat 1 for midnocturnal awakening if has 3 hours left to sleep., Disp: 60 capsule, Rfl: 5 Medication Side Effects: none  Family Medical/ Social History: Changes? See HPI   MENTAL HEALTH EXAM:  There were no vitals taken for this visit.There is no height or weight on file to calculate BMI.  General Appearance: Casual, Neat and Well Groomed  Eye Contact:  Good  Speech:  Clear and Coherent and Normal Rate  Volume:  Normal  Mood:   sad  Affect:  Congruent  Thought Process:  Goal Directed and Descriptions of Associations: Circumstantial  Orientation:  Full (Time, Place, and Person)  Thought Content: Logical   Suicidal Thoughts:  No  Homicidal Thoughts:  No  Memory:  WNL  Judgement:  Good  Insight:  Good  Psychomotor Activity:  Normal  Concentration:  Concentration: Good and Attention Span: Good  Recall:  Good  Fund of  Knowledge: Good  Language: Good  Assets:  Desire for Improvement Financial Resources/Insurance Housing Transportation  ADL's:  Intact  Cognition: WNL  Prognosis:  Good   DIAGNOSES:    ICD-10-CM   1. Adjustment disorder with mixed anxiety and depressed mood  F43.23     2. Bipolar 1 disorder, mixed, moderate (HCC)  F31.62     3. Insomnia due to other mental disorder  F51.05    F99       Receiving Psychotherapy: Yes  Dr. Mardelle Matte [place]  RECOMMENDATIONS:  PDMP reviewed.  Valium filled 06/30/2022.  Sonata filled 06/28/2022. I provided 20 minutes of  face to face time during this encounter, including time spent before and after the visit in records review, medical decision making, counseling pertinent to today's visit, and charting.   The anger and irritability have improved with increasing the Trileptal.  He has not been on the Lamictal long enough to know if it is making a positive difference yet or not.  We agree to leave the meds the same and recheck in 4 to 6 weeks.  Continue Buspar 15 mg, 3 in am and 2 po qhs.. Cont Valium 5 mg po tid prn. Cont Hydroxyzine 25 mg, 1-2 po tid prn.  Continue Lamictal  200 mg, 1 p.o. twice daily. Continue Trileptal 600 mg, 1.5 pills p.o. twice daily. Cont Sonata 10 mg qhs prn sleep.  Cont therapy with Dr. [name]. Labs ordered as above. Return in 4-6 weeks.  Melony Overly, PA-C

## 2022-08-20 ENCOUNTER — Ambulatory Visit (INDEPENDENT_AMBULATORY_CARE_PROVIDER_SITE_OTHER): Payer: No Typology Code available for payment source | Admitting: Psychiatry

## 2022-08-20 DIAGNOSIS — F3131 Bipolar disorder, current episode depressed, mild: Secondary | ICD-10-CM

## 2022-08-20 DIAGNOSIS — F4323 Adjustment disorder with mixed anxiety and depressed mood: Secondary | ICD-10-CM

## 2022-08-20 DIAGNOSIS — Z8659 Personal history of other mental and behavioral disorders: Secondary | ICD-10-CM | POA: Diagnosis not present

## 2022-08-20 DIAGNOSIS — F411 Generalized anxiety disorder: Secondary | ICD-10-CM | POA: Diagnosis not present

## 2022-08-20 DIAGNOSIS — Z630 Problems in relationship with spouse or partner: Secondary | ICD-10-CM

## 2022-08-20 NOTE — Progress Notes (Signed)
Psychotherapy Progress Note [facility] Luan Moore, PhD LP  Patient ID: Tristan [name])    MRN: [id number] Therapy format: Individual psychotherapy Date: 08/20/2022      Start: 1:10p     Stop: 2:00p     Time Spent: 50 min Location: In-person   Session narrative (presenting needs, interim history, self-report of stressors and symptoms, applications of prior therapy, status changes, and interventions made in session) Was able to convince [name] to take a break on compulsive househunting.  Unfortunately, compulsive worry about yet another sick dog took up the space made, largely to the effect of [name] having to stand watch based on [name]'s anxiety.  Overinflated, by Fritz's reckoning, since it was just eating grass and purging, but took a win on getting her to pause on housing and spotted her anxiety about the dog for understanding that the last couple tragic losses and her history of abortion (after wrongheadedly sabotaging birth control) really tend to amplify distress and the potential for pain and guilt.  Once resolved, she went back to househunting full bore, but pestering less, and traveling tomorrow.  Personally, ready to accept any house [name] finds acceptable, just to see the end of it, and figuring any property is adjustable.  Putting up with new rounds of sentimental agonizing and frustration as she does, but clearer he can work with whatever is likely to come through, plus one of the initial favorite houses came open again.    Validating art show in [place], good time hanging out with an artist friend, and unexpected source of evil glee for both of them -- esp [name] -- seeing a sleazy promoter get his due.  Discussed possibility that [name] is still sensitized to the possibilities not only of being terribly wrong but of setting [name] off in anger, which is where we began in therapy a few years ago.  Challenged if he would know for sure whether that's  what's driving her, and if he would be willing to check the perception and relieve her worry.  Therapeutic modalities: Cognitive Behavioral Therapy, Solution-Oriented/Positive Psychology, Ego-Supportive, and Assertiveness/Communication  Mental Status/Observations:  Appearance:   Casual     Behavior:  Appropriate  Motor:  Normal  Speech/Language:   Clear and Coherent  Affect:  Appropriate  Mood:  anxious and dysthymic  Thought process:  normal  Thought content:    WNL  Sensory/Perceptual disturbances:    WNL  Orientation:  Fully oriented  Attention:  Good    Concentration:  Good  Memory:  WNL  Insight:    Good  Judgment:   Good  Impulse Control:  Good   Risk Assessment: Danger to Self: No Self-injurious Behavior: No Danger to Others: No Physical Aggression / Violence: No Duty to Warn: No Access to Firearms a concern: No  Assessment of progress:  progressing  Diagnosis:   ICD-10-CM   1. Adjustment disorder with mixed anxiety and depressed mood  F43.23     2. Bipolar affective disorder, currently depressed, mild ([place])  F31.31     3. History of posttraumatic stress disorder (PTSD)  Z86.59     4. Generalized anxiety disorder  F41.1     5. Relationship problem between partners  Z63.0      Plan:  Focal challenge to notice, and if indicated check theory with [name] that a component of her annoying compulsivity is actually running scared of his imagined anger With [name] -- Keep working constructive agreement to focus on projects at  hand, not create new ones without agreement they are necessary.  Stay as matter of fact as possible when assertiveness is needed, self-affirm it is OK to honestly answer "what's wrong" and trust her to hear out honest sadness, and reward incremental improvements in her self-restraint and compromising wherever possible.  OK to keep asking her attention to when she is either flooding or abandoning and ask her to do differently.  Notice resentment trying  to brew and turn it to constructive action to either care for self, ask behavior, or ask a break to collect himself.  Remember assertiveness is a vote of confidence to a worrisome partner, too, even if he has to shepherd the process and her understandings.  Seek to make agreements on how they want to handle stress/anxiety/disappointment to come, before being under the gun with events and moods.  Try to structure when to engage details, conflict, stressful listening, and participation in things he considers irrational.  Open invitation to couples session(s). For moving -- Continue to assess needs for setting up housekeeping and creating new social circle and services in [place].  Continue decluttering and downsizing at discretion.  Work through Best boy of farewell notifications.  Available to consult on finding local services in [place]. For stress management and distress tolerance -- Continuing option to try stressful event journaling (what, how feel, what did, how turned out) just to externalize and to create a personal "menu" of things he can do to cope with distress.  Both should help make options more handy, break down sense of helplessness that may fuel anger.  Include ludicrous or taboo options to help legitimize sense of choice.  Use tips for recontextualizing road-rage temptations. For self-esteem and anxiety about coping, anger -- hold onto the affirmations given about being an excellent and cherished friend, self-affirm normal variations on grieving and finding his voice Sleep regulation -- Address sleep hygiene and light control as needed Dental -- Follow through with planned and needed dental work as intended Substances -- Maintain control over urges to self-medicate with alcohol or THC, try to apply nonchemical soothing wherever possible and assert as needed Other recommendations/advice as may be noted above Continue to utilize previously learned skills ad lib Maintain medication  as prescribed and work faithfully with relevant prescriber(s) if any changes are desired or seem indicated Call the clinic on-call service, 988/hotline, 911, or present to [facility] or ER if any life-threatening psychiatric crisis Return for as already scheduled. Already scheduled visit in this office 09/12/2022.  Blanchie Serve, PhD Luan Moore, PhD LP Clinical Psychologist, [facility] [address], [place], [place] [address] [phone]

## 2022-09-12 ENCOUNTER — Ambulatory Visit (INDEPENDENT_AMBULATORY_CARE_PROVIDER_SITE_OTHER): Payer: No Typology Code available for payment source | Admitting: Psychiatry

## 2022-09-12 DIAGNOSIS — F99 Mental disorder, not otherwise specified: Secondary | ICD-10-CM

## 2022-09-12 DIAGNOSIS — Z630 Problems in relationship with spouse or partner: Secondary | ICD-10-CM

## 2022-09-12 DIAGNOSIS — F1210 Cannabis abuse, uncomplicated: Secondary | ICD-10-CM

## 2022-09-12 DIAGNOSIS — F5105 Insomnia due to other mental disorder: Secondary | ICD-10-CM

## 2022-09-12 DIAGNOSIS — F3131 Bipolar disorder, current episode depressed, mild: Secondary | ICD-10-CM

## 2022-09-12 DIAGNOSIS — F4323 Adjustment disorder with mixed anxiety and depressed mood: Secondary | ICD-10-CM | POA: Diagnosis not present

## 2022-09-12 DIAGNOSIS — Z8659 Personal history of other mental and behavioral disorders: Secondary | ICD-10-CM | POA: Diagnosis not present

## 2022-09-12 DIAGNOSIS — Z634 Disappearance and death of family member: Secondary | ICD-10-CM

## 2022-09-12 NOTE — Progress Notes (Signed)
Psychotherapy Progress Note [facility] Luan Moore, PhD LP  Patient ID: Tristan [name])    MRN: [id number] Therapy format: Individual psychotherapy Date: 09/12/2022      Start: 9:15a     Stop: 10:00a     Time Spent: 45 min Location: In-person   Session narrative (presenting needs, interim history, self-report of stressors and symptoms, applications of prior therapy, status changes, and interventions made in session) 3 weeks since last seen.  Tristan [name] continued the housing search, has verbalized not hanging onto the "forever home" fantasy at this point, ready to just find something livable and refigure from there.  Still, difficulties abound, between finding that they see spaces and possibilities very differently, her haphazard camera work making it hard to even see what she's showing, and feeling the rush of her jumping to rosy conclusions about a house "working" for his needs.  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  It took days to work up to telling her, and he felt punished again bringing up objections, between her becoming irritable and martyring.  Got to the point of having to lay it out explicitly that he wants to be listened to, and heard out, without jumping to conclusions or being kowtowed to.  His specs for a work space are fairly complex in their own right, and he hasn't ruled this house out, but it would mean some substantial modifications, possibly raising another building as the workshop, or renting a studio space, and the press to look and evaluate quickly, and her press to fly home for the weekend, are running afoul of his commitment to attend his most cherished art show  next Friday.  Tristan [name] keeps saying and showing different things, professing from [place] how much she wants to get back in the same house, but sequestering with her phone and computer when she does.    All told, it is to the point where he is reconsidering even moving to [place], and wondering if this is how they break up.  Discussed at some length, clarifying values, including how very good this job and company are for [employer], a truly kind place to work where even new colleagues have encouraged her to ease up.  Ultimately encouraged that this is workable, still, and that directly asking her to hear both what hurts and what he doesn't mean by objections is good communication work.  Reviewed tactics for recruiting open-minded, noncatastrophic listening on her part.  Therapeutic modalities: Cognitive Behavioral Therapy, Solution-Oriented/Positive Psychology, Ego-Supportive, and Assertiveness/Communication  Mental Status/Observations:  Appearance:   Casual     Behavior:  Appropriate  Motor:  Normal  Speech/Language:   Clear and Coherent  Affect:  Appropriate  Mood:  dysthymic  Thought process:  normal  Thought content:    WNL  Sensory/Perceptual disturbances:    WNL  Orientation:  Fully oriented  Attention:  Good    Concentration:  Good  Memory:  WNL  Insight:    Good  Judgment:   Good  Impulse Control:  Good   Risk Assessment: Danger to Self: No Self-injurious Behavior: No Danger to Others: No Physical Aggression / Violence: No Duty to Warn: No Access to Firearms a concern: No  Assessment of progress:  stabilized  Diagnosis:   ICD-10-CM   1. Bipolar affective disorder, currently depressed, mild ([place])  F31.31     2. Adjustment disorder with mixed anxiety and depressed mood  F43.23     3. History of posttraumatic stress disorder (PTSD)  Z86.59     4. Relationship problem between partners  Z63.0     5. Insomnia due to other mental disorder  F51.05    F99     6. Bereavement (pets)   Z63.4     7. Tetrahydrocannabinol (THC) use disorder, mild, abuse  F12.10      Plan:  With Tristan [name] -- Keep working constructive agreement to focus on projects at hand, not create new ones without agreement they are necessary.  Stay as matter of fact as possible when assertiveness is needed, OK to honestly answer "what's wrong" and trust her to hear out honest sadness.  Reward incremental improvements in her self-restraint, compromising, and above all, stopping to listen rather than jump to conclusions.  OK to keep asking her attention to when she is flooding or abandoning, and ask her to do differently.  Stay ready to check theory with her that some of her annoying compulsivity is actually running scared of his imagined anger, and to notice when it doesn't have to be so urgent.  Notice resentment trying to brew and use it as a cue to either care for self, share and ask consideration, or ask a break to collect himself.  Remember assertiveness is also a vote of confidence to a worrisome partner, even if he has to spell things out.  Where possible, seek to make agreements how they want to handle stress/anxiety/disappointment to come, before being under the gun with events and moods.  As able, designate when it's work time, work on relationship time, or don't have to work at anything time.  Open invitation to couples session(s), in person or remote/hybrid. For moving -- Continue to assess needs for setting up housekeeping and creating new social circle and services in [place].  Continue decluttering and downsizing at discretion.  Available to consult on finding local services in greater [place]. For stress management and distress tolerance -- Continuing option to try stressful event journaling (what, how feel, what did, how turned out) just to externalize and to create a personal "menu" of things he can do to cope with distress.  Both should help make options more handy, break down sense of helplessness that may  fuel anger.  Include ludicrous or taboo options to help legitimize sense of choice.  Use tips for recontextualizing road-rage temptations. For self-esteem and anxiety about coping, anger -- hold onto the affirmations given about being an excellent and cherished friend, self-affirm normal variations on grieving and finding his voice Sleep regulation -- Address sleep hygiene and light control as needed Substances -- Stay temperate with alcohol and MJ.  Prefer no THC, and continue to encourage trying nonchemical soothing first, wherever possible.   Other recommendations/advice as may be noted above Continue to utilize previously learned skills ad lib Maintain medication as prescribed and work faithfully with relevant prescriber(s) if any changes are desired or seem indicated Call the clinic on-call service, 988/hotline, 911, or present to [facility] or ER if any life-threatening psychiatric crisis Return for time as available. Already scheduled visit in this office 09/19/2022.  Blanchie Serve, PhD Luan Moore, PhD LP Clinical Psychologist, [facility] [address], [place], [place] [address] (  o) [phone]

## 2022-09-19 ENCOUNTER — Ambulatory Visit (INDEPENDENT_AMBULATORY_CARE_PROVIDER_SITE_OTHER): Payer: No Typology Code available for payment source | Admitting: Physician Assistant

## 2022-09-19 ENCOUNTER — Encounter: Payer: Self-pay | Admitting: Physician Assistant

## 2022-09-19 DIAGNOSIS — F99 Mental disorder, not otherwise specified: Secondary | ICD-10-CM

## 2022-09-19 DIAGNOSIS — F411 Generalized anxiety disorder: Secondary | ICD-10-CM

## 2022-09-19 DIAGNOSIS — F319 Bipolar disorder, unspecified: Secondary | ICD-10-CM

## 2022-09-19 DIAGNOSIS — F4323 Adjustment disorder with mixed anxiety and depressed mood: Secondary | ICD-10-CM | POA: Diagnosis not present

## 2022-09-19 DIAGNOSIS — F5105 Insomnia due to other mental disorder: Secondary | ICD-10-CM

## 2022-09-19 DIAGNOSIS — F4310 Post-traumatic stress disorder, unspecified: Secondary | ICD-10-CM

## 2022-09-19 MED ORDER — DIAZEPAM 5 MG PO TABS: 5.0000 mg | ORAL_TABLET | Freq: Three times a day (TID) | ORAL | 1 refills | Status: DC | PRN

## 2022-09-19 MED ORDER — ZALEPLON 10 MG PO CAPS: ORAL_CAPSULE | ORAL | 5 refills | Status: AC

## 2022-09-19 NOTE — Progress Notes (Signed)
[facility]  Patient ID: Tristan [name],  MRN: [id number]  PCP: Patient, No Pcp Per  Date of Evaluation: 09/19/2022 time spent:20 minutes  Chief Complaint:  Chief Complaint   Anxiety; Depression; Insomnia; Follow-up    HISTORY/CURRENT STATUS: HPI  for 1 month f/u.  Under a lot of stress, they found a house in [place], closing on that today. Move date hasn't been set though, complications w/ moving company who want an itemized list of replacement costs, etc. 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   Mood is stable. He knows the move and all the stress involved has made him more depressed, anxious, have more insomnia, but he feels like meds are in a good place. Except for sleep, wakes up in the middle of the night, takes either a hydroxyzine or diazepam but even with that, he is awake for about 2 hours. His insurance hasn't allowed 2 Sonata per night. Energy and motivation are nl.  No extreme sadness, tearfulness, or feelings of hopelessness.  ADLs and personal hygiene are normal.   Denies any changes in concentration, making decisions, or remembering things.  Appetite has not changed.  Weight is stable.   Denies suicidal or homicidal thoughts.  Patient denies increased energy with decreased need for sleep, increased talkativeness, racing thoughts, impulsivity or risky behaviors, increased spending, increased libido, grandiosity, paranoia, or hallucinations.  Denies dizziness, syncope, seizures, numbness, tingling, tremor, tics, unsteady gait, slurred speech, confusion. Denies muscle or joint pain, stiffness, or dystonia.  Individual Medical History/ Review of Systems: Changes? :No   Past medications for mental health diagnoses include: Celexa, Lamictal, Lexapro, Seroquel, prazosin, Trileptal, trazodone, Vistaril, Sonata.   Allergies: Patient has no known allergies.  Current Medications:  Current Outpatient Medications:    busPIRone (BUSPAR) 15 MG  tablet, TAKE 3 TABLETS IN THE MORNING AND 2 TABLETS AT BEDTIME, Disp: 450 tablet, Rfl: 1   hydrOXYzine (ATARAX) 25 MG tablet, TAKE 1 TO 2 TABLETS (25-50 MG TOTAL) BY MOUTH EVERY 6 HOURS AS NEEDED FOR ANXIETY, Disp: 360 tablet, Rfl: 0   lamoTRIgine (LAMICTAL) 200 MG tablet, Take 1 tablet (200 mg total) by mouth 2 (two) times daily., Disp: 180 tablet, Rfl: 1   oxcarbazepine (TRILEPTAL) 600 MG tablet, 1.5 pills q am and 1 po qhs for 4 days, then increase to 1.5 pills bid. (Patient taking differently: Take 900 mg by mouth 2 (two) times daily. 1.5 pills q am and 1 po qhs for 4 days, then increase to 1.5 pills bid.), Disp: 270 tablet, Rfl: 3   diazepam (VALIUM) 5 MG tablet, Take 1 tablet (5 mg total) by mouth every 8 (eight) hours as needed. for anxiety, Disp: 90 tablet, Rfl: 1   zaleplon (SONATA) 10 MG capsule, 1 po qhs prn and may repeat 1 for midnocturnal awakening if has 3 hours left to sleep prn., Disp: 60 capsule, Rfl: 5 Medication Side Effects: none  Family Medical/ Social History: Changes? See HPI   MENTAL HEALTH EXAM:  There were no vitals taken for this visit.There is no height or weight on file to calculate BMI.  General Appearance: Casual, Neat and Well Groomed  Eye Contact:  Good  Speech:  Clear and Coherent and Normal Rate  Volume:  Normal  Mood:  Euthymic  Affect:  Congruent  Thought Process:  Goal Directed and Descriptions of Associations: Circumstantial  Orientation:  Full (Time, Place, and Person)  Thought Content: Logical   Suicidal Thoughts:  No  Homicidal Thoughts:  No  Memory:  WNL  Judgement:  Good  Insight:  Good  Psychomotor Activity:  Normal  Concentration:  Concentration: Good and Attention Span: Good  Recall:  Good  Fund of Knowledge: Good  Language: Good  Assets:  Desire for Improvement Financial Resources/Insurance Housing Transportation  ADL's:  Intact  Cognition: WNL  Prognosis:  Good   Labs from 07/24/2022 reviewed   DIAGNOSES:    ICD-10-CM   1.  Adjustment disorder with mixed anxiety and depressed mood  F43.23     2. Bipolar I disorder ([place])  F31.9     3. Generalized anxiety disorder  F41.1     4. Insomnia due to other mental disorder  F51.05    F99     5. PTSD (post-traumatic stress disorder)  F43.10       Receiving Psychotherapy: Yes  Dr. Jonni Sanger [place]  RECOMMENDATIONS:  PDMP reviewed.  Valium filled 06/30/2022.  Sonata filled 09/04/2022 I provided 20 minutes of face to face time during this encounter, including time spent before and after the visit in records review, medical decision making, counseling pertinent to today's visit, and charting.   Discussed insomnia. He's practicing good sleep hygiene as much as possible, continue that. Since he has new insurance, hopefully they'll pay for 2 Sonata per night. Otherwise no changes.   Continue Buspar 15 mg, 3 in am and 2 po qhs.. Cont Valium 5 mg po tid prn. Cont Hydroxyzine 25 mg, 1-2 po tid prn.  Continue Lamictal  200 mg, 1 p.o. twice daily. Continue Trileptal 600 mg, 1.5 pills p.o. twice daily. Cont Sonata 10 mg qhs prn sleep, and may repeat for midnocturnal awakening as long as he has 3 hours left to sleep. Cont therapy with Dr. Luan Moore. He will likely be in [place] within the next month or so, I'm unable to do telehealth visit b/c I'm not licensed in [place]. I will take care of his meds for 3-6 months while he tries to find a new provider, as long as he's stable. If his conditon changes though and med changes need to be made, I'll need him to find a new provider asap. He understands.  I wished him well, he's been a joy to work with and has made huge strides in self-care, therapy and mental health all the way around.  Return prn.   Donnal Moat, PA-C

## 2022-09-30 ENCOUNTER — Ambulatory Visit (INDEPENDENT_AMBULATORY_CARE_PROVIDER_SITE_OTHER): Payer: No Typology Code available for payment source | Admitting: Psychiatry

## 2022-09-30 DIAGNOSIS — F4323 Adjustment disorder with mixed anxiety and depressed mood: Secondary | ICD-10-CM

## 2022-09-30 DIAGNOSIS — Z630 Problems in relationship with spouse or partner: Secondary | ICD-10-CM

## 2022-09-30 DIAGNOSIS — F411 Generalized anxiety disorder: Secondary | ICD-10-CM

## 2022-09-30 DIAGNOSIS — Z634 Disappearance and death of family member: Secondary | ICD-10-CM

## 2022-09-30 DIAGNOSIS — F319 Bipolar disorder, unspecified: Secondary | ICD-10-CM

## 2022-09-30 DIAGNOSIS — Z8659 Personal history of other mental and behavioral disorders: Secondary | ICD-10-CM

## 2022-09-30 NOTE — Progress Notes (Signed)
Psychotherapy Progress Note [facility] Luan Moore, PhD LP  Patient ID: Tristan [name])    MRN: [id number] Therapy format: Individual psychotherapy Date: 09/30/2022      Start: 8:13a     Stop: 9:00a     Time Spent: 47 min Location: In-person   Session narrative (presenting needs, interim history, self-report of stressors and symptoms, applications of prior therapy, status changes, and interventions made in session) Moving early next week, now, and in "panic mode".  Got a house picked out, closed, [name] began carrying special items to the house yesterday.  Insurance company list -- an incredibly daunting task -- is due Friday, and planning time is involved and subject to many variables, including last times seeing friends.  Massive inventory of toys and totes, hopefully sturdy enough to use for moving.  Very grateful for old friend Larkin Ina, rarely seen in person in adulthood, stepping up to accompany them on the flight to [facility] to help port the dogs, and drive them to airport.  Another friend Truman Hayward, and another [place], may also be available, either as traveler or help wrapping and inventorying the many valuables in his toy/art collection.  Toy show Feb 3-4, made the trip solo on that Sunday to [place].  Uncharacteristically drove aggressively, at speed up the shoulder, to pass people.  Does realize the risk now that he could have hit some obstacle or caused an accident, and clear that he was not trying to risk his or others' safety nor would in the future, just a convergence of feeling the need to get this last bonding time with beloved friend in the art community after bargaining down his time there.  While there, discovered that one in particular actually lives in [place], another in [place], and they were favorable about the opportunities in [place], with ideas brewing on work to do and how to collaborate and support each other, including starting up a new gathering  [place], to complement known events in the [place] and [place].   Dawning now that he can have true and regular artistic friends in [place].  Moved, as he has never collected so many hugs from guys outside of a funeral.    While [place] home, no maddening memories made, he got to cook some, and even got her out to see mutual friends at a local show and help deliver a bag of cherished toys to a friend's child who is into a brand.  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  On top of it, even learned that a couple large pieces he hasn't moved in 2 years got bought at an out of state gallery.  Affirmed and encouraged.  Reaffirmed agreement to serve transitionally as he works out relocation.  Therapeutic modalities: Cognitive Behavioral Therapy, Solution-Oriented/Positive Psychology, and Ego-Supportive  Mental Status/Observations:  Appearance:   Casual     Behavior:  Appropriate  Motor:  Normal  Speech/Language:   Clear and Coherent  Affect:  Appropriate  Mood:  anxious and more situational, substantially more hopeful  Thought process:  normal  Thought content:    WNL  Sensory/Perceptual disturbances:    WNL  Orientation:  Fully oriented  Attention:  Good    Concentration:  Good  Memory:  WNL  Insight:    Good  Judgment:   Good  Impulse Control:  Good  Risk Assessment: Danger to Self: No Self-injurious Behavior: No Danger to Others: No Physical Aggression / Violence: No Duty to Warn: No Access to Firearms a concern: No  Assessment of progress:  progressing  Diagnosis:   ICD-10-CM   1. Adjustment disorder with mixed anxiety and depressed mood  F43.23     2. Bipolar I disorder ([place])  F31.9     3. Generalized anxiety disorder  F41.1     4. History of posttraumatic stress disorder (PTSD)  Z86.59     5. Bereavement (pets)  Z63.4     6. Relationship  problem between partners  Z63.0      Plan:  With [place] -- Keep working constructive agreement to focus on projects at hand, not create new ones without a true agreement they're necessary.  Stay as matter of fact as possible when assertiveness is needed.  OK to honestly answer a "what's wrong" question, and trust her to hear out any honest sadness, anxiety, intimidation, loneliness, etc.  Reward any incremental improvements she makes in self-restraint, compromise, and above all, stopping to listen without jumping to conclusions or having to come up with an irrational or unauthorized "fix".  When irritating, assume she's put it on herself to urgently placate him without checking if it's actually needed.  OK to keep asking her to notice when she is flooding him or abandoning him, and to ask her to do differently.  Notice early any resentment trying to brew, and use it as a cue to either care for himself, share and ask consideration, or ask a break to collect himself.  Remember that letting her know and asking her to hear something is also a vote of confidence that she is capable, even if he has to spell things out.  Where possible, seek to make agreements on how they want to handle stress/anxiety/disappointment they can forecast, before being under the gun with events and moods.  As able, designate when it's work time, work on relationship time, or don't have to work at anything time.  Open invitation to couples session(s), in person or remote/hybrid. For moving -- Continue to assess needs for setting up housekeeping and creating new social circle and services in [facility].  Continue decluttering and downsizing at discretion.  Available to consult on finding local services in greater [place]. For stress management and distress tolerance -- Continuing option to try stressful event journaling (what, how feel, what did, how turned out) just to externalize and to create a personal "menu" of things he can do to cope  with distress.  Both should help make options more handy, break down sense of helplessness that may fuel anger.  Include ludicrous or taboo options to help legitimize sense of choice.  Use tips for recontextualizing road-rage temptations. For morale, self-esteem, anxiety about coping, anger -- Hold onto the affirmations given about being an excellent and cherished friend, self-affirm normal variations on grieving and finding his voice to express when needed, through journaling, art, or sharing.  Make plans to see art friends in [place] when convenient. Sleep regulation -- Address sleep hygiene and light control as needed Substances -- Stay temperate with alcohol and MJ.  Prefer no THC, and continue to encourage trying nonchemical soothing first, wherever possible.   Other recommendations/advice as may be noted above Continue to utilize previously learned skills ad lib Maintain medication as prescribed and work faithfully with relevant prescriber(s) if any changes are desired or seem indicated Call the clinic on-call service, 988/hotline, 911,  or present to [facility] or ER if any life-threatening psychiatric crisis Return for as already scheduled. Already scheduled visit in this office 10/14/2022.  Blanchie Serve, PhD Luan Moore, PhD LP Clinical Psychologist, [facility] [address] [place], [place] [address] [phone]

## 2022-10-14 ENCOUNTER — Ambulatory Visit (INDEPENDENT_AMBULATORY_CARE_PROVIDER_SITE_OTHER): Payer: No Typology Code available for payment source | Admitting: Psychiatry

## 2022-10-14 DIAGNOSIS — F99 Mental disorder, not otherwise specified: Secondary | ICD-10-CM

## 2022-10-14 DIAGNOSIS — Z8659 Personal history of other mental and behavioral disorders: Secondary | ICD-10-CM

## 2022-10-14 DIAGNOSIS — F319 Bipolar disorder, unspecified: Secondary | ICD-10-CM

## 2022-10-14 DIAGNOSIS — F411 Generalized anxiety disorder: Secondary | ICD-10-CM

## 2022-10-14 DIAGNOSIS — F4323 Adjustment disorder with mixed anxiety and depressed mood: Secondary | ICD-10-CM | POA: Diagnosis not present

## 2022-10-14 DIAGNOSIS — F5105 Insomnia due to other mental disorder: Secondary | ICD-10-CM

## 2022-10-14 DIAGNOSIS — Z630 Problems in relationship with spouse or partner: Secondary | ICD-10-CM

## 2022-10-14 NOTE — Progress Notes (Signed)
Psychotherapy Progress Note [facility] Luan Moore, PhD LP  Patient ID: [name])    MRN: [id number] Therapy format: Individual psychotherapy Date: 10/14/2022      Start: 8:15a     Stop: 9:05a     Time Spent: 50 min Location: Telehealth visit -- I connected with this patient by an approved telecommunication method (audio only), with his informed consent, and verifying identity and patient privacy.  I was located at my office and patient at his home.  As needed, we discussed the limitations, risks, and security and privacy concerns associated with telehealth service, including the availability and conditions which currently govern in-person appointments and the possibility that 3rd-party payment may not be fully guaranteed and he may be responsible for charges.  After he indicated understanding, we proceeded with the session.  Also discussed treatment planning, as needed, including ongoing verbal agreement with the plan, the opportunity to ask and answer all questions, his demonstrated understanding of instructions, and his readiness to call the office should symptoms worsen or he feels he is in a crisis state and needs more immediate and tangible assistance.   Session narrative (presenting needs, interim history, self-report of stressors and symptoms, applications of prior therapy, status changes, and interventions made in session) Moving process begun.  One dog was annoyed by the flight down required much attention and patience from other passengers on the plane.  This morning had "fierce" dread attack 3am, partly for thinking of things that may get broken -- some of it art work, unknown how things got packed, and having to overhear the driver redirecting laborers to re-pack things that are much more valuable than they seem.  Raises so much uncertainty.  Has had to face down an urge to hop a plane and fly back today.  The new house looks to mean so much change  in habits -- nothing in walking distance that he can tell, all requires driving, and realtor told them local drivers tend to have a lot less respect for lanes and DWI law.  Friends will be air flight or 3 hour drive away.  Restaurants they can find online seem to be all about beef, beer, and overpriced fast food.  Have met a couple neighbors.  A cop lives behind them, with security cameras, one pointing toward their property, raising fear of being monitored and busted for smoking a little pot.  Neighborhood watch, with the impression of some organization, but clearly no HOA to figure or guard against the politics of.    Has attended one super-massive flea market -- actually has its own amusement park -- appealing at first, but seemed remarkably under-attended, saw nothing of interest, and felt let down from his hope of finding something cool to his liking to help get started putting down roots.  Contending again with feeling unheard by [name], and c/o her getting in the way of things getting situated.  Running into the issue again of her thinking a sunny room is very appropriate for his toys, despite clear conversation to the contrary, and she is thinking she will set up her office in the center of the house, where she will be prone to distraction from various things, such that it would force [name] to be artificially quiet and manage the dogs' movements.  Doesn't see any space yet suitable for his work, which was part of the deal finding a house.  Repair issues including odd light switches in need of rewiring, pet stains  in the carpet, and untrustworthy appliances, plus floor wear and cheap/ugly fixtures.  Disorienting weather, too -- 94 yesterday.  Does like the pool, sees possibilities, if only putting his feet in.  [name] is obsessing some about the dogs' safety, worried about unknown plants she started to look up and convinced herself are poisonous and liable to be eaten indiscriminately.  [name] much more  trusting that they won't, and that one of them will be able to call them off if they get too curious.    Affirmed and encouraged.  Discussing ways to break down the daunting nature of the home task, offered idea to get fluorescent stickers, use them as "hope dots" -- put them on anything they see that they mean to change, like fixtures, so they can be beacons of hope in the house becoming more livable before long.  Does have hope of finding industrial park space for his studio, and of getting the electrician back to sort out switches.  Discussed tactics for [name] by himself, and with [name], to explore the yard together, with the dogs, so she can see what's not a catastrophe poised to happen.  Encouraged to project confidence for [name] that the whole process will messy and tense, but it will still work as they put in the efforts.  Another idea offered to break out a [employer] figure from somewhere and let him be their inspiration -- a kind of "[place]" for what feels like their "[place]" situation.  Therapeutic modalities: Cognitive Behavioral Therapy, Solution-Oriented/Positive Psychology, and Ego-Supportive  Mental Status/Observations:  Appearance:   Not assessed     Behavior:  Appropriate  Motor:  Not assessed  Speech/Language:   Clear and Coherent  Affect:  Not assessed  Mood:  anxious and irritable  Thought process:  normal  Thought content:    WNL and multiple worries, tendency to catastrophize  Sensory/Perceptual disturbances:    WNL  Orientation:  Fully oriented  Attention:  Good    Concentration:  Fair  Memory:  WNL  Insight:    Good  Judgment:   Good  Impulse Control:  Fair   Risk Assessment: Danger to Self: No Self-injurious Behavior: No Danger to Others: No Physical Aggression / Violence: No Duty to Warn: No Access to Firearms a concern: No  Assessment of progress:  stabilized  Diagnosis:   ICD-10-CM   1. Bipolar I disorder ([place])  F31.9     2. Adjustment disorder with  mixed anxiety and depressed mood  F43.23     3. Generalized anxiety disorder  F41.1     4. History of posttraumatic stress disorder (PTSD)  Z86.59     5. Relationship problem between partners  Z63.0     6. Insomnia due to other mental disorder  F51.05    F99      Plan:  With [place] -- Keep working constructive agreement to focus on projects at hand, not create new ones without a true agreement they're necessary.  OK to ask her not to catastrophize, try not to fight it when she does.  Stay as matter of fact as possible whenever assertiveness is needed, figuring she's operating on an inflated fear he will become enraged and she will feel guilty.  OK to honestly answer a "what's wrong" question, and trust her to hear out any honest sadness, anxiety, intimidation, loneliness, etc.  Reward any incremental improvements she makes in self-restraint, compromise, realistic planning, and above all, stopping to listen without jumping to conclusions or having  to come up with an irrational "fix".  When irritating, assume she's put it on herself to urgently placate him without perception-checking.  OK to keep asking her to notice when she is flooding him or abandoning him, and ask her to do differently.  Notice early any resentment of his own trying to brew, and use it as a cue to either care well for himself, share and ask consideration, or ask a break to collect himself, letting her know he'll come back.  Remember that letting her know and asking her to hear something is also a vote of confidence that she is capable, even if he has to spell things out, and even if she misses it in the moment.  Where possible, seek to make agreements on how they want to handle stress/anxiety/disappointment they can forecast, before being under the gun with events and moods.  As able, designate when it's work time, work on relationship time, or don't have to work at anything time.  Open invitation to couples session(s), in person or  remote/hybrid. Relocation -- Bear with the chaos of unloading and setting up house.  Continue to assess needs for setting up housekeeping and creating new social circle and services.  Continue decluttering and downsizing as needed.  Available to consult on finding local services once in condition to seek. For stress management and distress tolerance -- Continuing option to try stressful event journaling (what, how feel, what did, how turned out) just to externalize and to create a personal "menu" of things he can do to cope with distress.  Both should help make options more handy, break down sense of helplessness that may fuel anger.  Include ludicrous or taboo options to help legitimize sense of choice. For morale, self-esteem, anxiety about coping, anger -- Hold onto friends' affirmations, and ledges of being available in person or by phone, video, or travel.  Option use [employer] as Visual merchandiser.  Make sure to breakdown what seem like gargantuan tasks getting settled. Sleep regulation -- Address sleep hygiene and light control as needed Substances -- Stay temperate with alcohol and MJ.  Prefer no THC, and continue to encourage trying nonchemical soothing first, wherever possible.   Other recommendations/advice as may be noted above Continue to utilize previously learned skills ad lib Maintain medication as prescribed and work faithfully with relevant prescriber(s) if any changes are desired or seem indicated Call the clinic on-call service, 988/hotline, 911, or present to [facility] or ER if any life-threatening psychiatric crisis Return for time as already scheduled. Already scheduled visit in this office 10/28/2022.  Blanchie Serve, PhD Luan Moore, PhD LP Clinical Psychologist, [facility] [address], [place], [place] [address] [phone]

## 2022-10-28 ENCOUNTER — Ambulatory Visit (INDEPENDENT_AMBULATORY_CARE_PROVIDER_SITE_OTHER): Payer: No Typology Code available for payment source | Admitting: Psychiatry

## 2022-10-28 DIAGNOSIS — F411 Generalized anxiety disorder: Secondary | ICD-10-CM | POA: Diagnosis not present

## 2022-10-28 DIAGNOSIS — F4323 Adjustment disorder with mixed anxiety and depressed mood: Secondary | ICD-10-CM | POA: Diagnosis not present

## 2022-10-28 DIAGNOSIS — F319 Bipolar disorder, unspecified: Secondary | ICD-10-CM | POA: Diagnosis not present

## 2022-10-28 DIAGNOSIS — Z8659 Personal history of other mental and behavioral disorders: Secondary | ICD-10-CM | POA: Diagnosis not present

## 2022-10-28 DIAGNOSIS — Z630 Problems in relationship with spouse or partner: Secondary | ICD-10-CM

## 2022-10-28 NOTE — Progress Notes (Signed)
Psychotherapy Progress Note [facility] Luan Moore, PhD LP  Patient ID: Tristan [name])    MRN: [id number] Therapy format: Individual psychotherapy Date: 10/28/2022      Start: 11:17a     Stop: 12:07p     Time Spent: 50 min Location: Telehealth visit -- I connected with this patient by an approved telecommunication method (audio only), with his informed consent, and verifying identity and patient privacy.  I was located at my office and patient at his home.  As needed, we discussed the limitations, risks, and security and privacy concerns associated with telehealth service, including the availability and conditions which currently govern in-person appointments and the possibility that 3rd-party payment may not be fully guaranteed and he may be responsible for charges.  After he indicated understanding, we proceeded with the session.  Also discussed treatment planning, as needed, including ongoing verbal agreement with the plan, the opportunity to ask and answer all questions, his demonstrated understanding of instructions, and his readiness to call the office should symptoms worsen or he feels he is in a crisis state and needs more immediate and tangible assistance.   Session narrative (presenting needs, interim history, self-report of stressors and symptoms, applications of prior therapy, status changes, and interventions made in session) Trying to make the best of the move, not get consumed by the awful-ness and raw nerves happening.  Not quite so triggered to get on a plane and come home as he was 2 weeks ago, but the idea still occurs regularly.  Finding that [name] is turning miserable in the new house, shaming herself for not doing more in it after work, when it's patently obvious she's fully spending herself in the day's breadwinning, while [name] has been productively engaged homemaking.  Tried to tell her it doesn't have to be her job, let alone her shame, but  to no avail.  Came up with something she could engage unpacking, and while it seemed to work getting her off her own back, she also began reestablishing all her old habits of trying too hard, taking on too many tasks, avoiding and self-medicating with leisure activities, like multiple tarot card readings (as much as an hour a day).  And caffeinating, and short sleep.  As she does, now he's feeling overwhelmed again, partly for the onslaught of decisions he must make with possessions, including "a radical amount of purging to do" of things that got moved, partly for hearing [name] seem to forget completely about the deal they had that in this house there will be more space for his things and hers might have to downsize, and ostensibly for finding himself reliving the kind of alienation and disregard he came to feel in th house here, as [name] became engrossed in too many things, sleep deprived, reactive, and withdrawn.  Reviewed successes.  Has established 1 room mostly set up, the master bedroom.  Finding a distinct lack of shelving in the house, but was already thinking of using some vertical space somehow to decompress and stage putting things away.  Afflicted by his own cascade of tempting side ideas, like getting some comic books autographed, getting some Legos sold, and seeing if items he was most worried about made it intact.  Resolved to focus on 3 must-have today -- restack some thing s for working room, put out a call for Legos to go to a local shop, and putting in some search for high priority items.  Discussed tactics for recruiting [name] to  recognize expecting too much of herself and confront without coming  across grouchy or judgmental.  Main idea to observe how she keeps ___ing and ask if it's "working".  Concur that he has strong reasons to stand his ground about what they agreed to before claiming the house.  Given her tendency to become labile, encouraged readiness to table things and ask when  they could come back.   Also found that the house needs something considerably more expensive done, as wastewater plumbing under the house, inside the concrete slab, is in danger of caving in, an it will require tunneling under the slab to repair.  [name], for her part, is locked into feeling the money is too much, while [name] sees it plenty available, having never taken out a [place] before, and knowing they can do a home repair deferred interest account.  [name] can't see it happening, but indications the [place] house could go to sale soon, which would unlock her thinking if he can't.  Therapeutic modalities: Cognitive Behavioral Therapy, Solution-Oriented/Positive Psychology, and Ego-Supportive  Mental Status/Observations:  Appearance:   Not assessed     Behavior:  appropriate  Motor:  Not assessed  Speech/Language:   Clear and Coherent  Affect:  Not assessed  Mood:  dysthymic  Thought process:  normal  Thought content:    Rumination  Sensory/Perceptual disturbances:    WNL  Orientation:  Fully oriented  Attention:  Good    Concentration:  Good  Memory:  WNL  Insight:    Good  Judgment:   Good  Impulse Control:  Fair   Risk Assessment: Danger to Self: No Self-injurious Behavior: No Danger to Others: No Physical Aggression / Violence: No Duty to Warn: No Access to Firearms a concern: No  Assessment of progress:  stabilized  Diagnosis:   ICD-10-CM   1. Bipolar I disorder ([place])  F31.9     2. Adjustment disorder with mixed anxiety and depressed mood  F43.23     3. Generalized anxiety disorder  F41.1     4. History of posttraumatic stress disorder (PTSD)  Z86.59     5. Relationship problem between partners  Z63.0      Plan:  With [facility] -- Communication and lability management tactics above.  Keep working constructive agreement to focus on projects at hand, not create new ones without a true agreement they're necessary.  OK to ask her not to catastrophize, try not to fight  it when she does.  Stay as matter of fact as possible whenever assertiveness is needed, figuring she's operating on an inflated fear he will become enraged and she will feel guilty.  OK to honestly answer a "what's wrong" question, and trust her to hear out any honest sadness, anxiety, intimidation, loneliness, etc.  Reward any incremental improvements she makes in self-restraint, compromise, realistic planning, and above all, stopping to listen without jumping to conclusions or having to come up with an irrational "fix".  When irritating, assume she's put it on herself to urgently placate him without perception-checking.  OK to keep asking her to notice when she is flooding him or abandoning him, and ask her to do differently.  Notice early any resentment of his own trying to brew, and use it as a cue to either care well for himself, share and ask consideration, or ask a break to collect himself, letting her know he'll come back.  Remember that letting her know and asking her to hear something is also a vote of confidence  that she is capable, even if he has to spell things out, and even if she misses it in the moment.  Where possible, seek to make agreements on how they want to handle stress/anxiety/disappointment they can forecast, before being under the gun with events and moods.  As able, designate when it's work time, work on relationship time, or don't have to work at anything time.  Open invitation to couples session(s), in person or remote/hybrid. Relocation -- Bear with the chaos of unloading and setting up house.  Where possible, mask things that are too much to see and use visual cues to help focus and break down tasks.  Continue to assess needs for setting up housekeeping and creating new social circle and services.  Continue decluttering and downsizing as needed.  Available to consult on finding local services once in condition to seek. For stress management and distress tolerance -- Continuing option  to try stressful event journaling (what, how feel, what did, how turned out) just to externalize and to create a personal "menu" of things he can do to cope with distress.  Both should help make options more handy, break down sense of helplessness that may fuel anger.  Include ludicrous or taboo options to help legitimize sense of choice. For morale, self-esteem, anxiety about coping, anger -- Hold onto friends' affirmations, and ledges of being available in person or by phone, video, or travel.  Option use [employer] as Visual merchandiser.  Make sure to breakdown what seem like gargantuan tasks getting settled. Sleep regulation -- Address sleep hygiene and light control as needed Substances -- Stay temperate with alcohol and MJ.  Prefer no THC, and continue to encourage trying nonchemical soothing first, wherever possible.   Other recommendations/advice as may be noted above Continue to utilize previously learned skills ad lib Maintain medication as prescribed and work faithfully with relevant prescriber(s) if any changes are desired or seem indicated Call the clinic on-call service, 988/hotline, 911, or present to [facility] or ER if any life-threatening psychiatric crisis Return for time as already scheduled, put on [place] list, will call, recommend sched ahead. Already scheduled visit in this office 11/11/2022.  Blanchie Serve, PhD Luan Moore, PhD LP Clinical Psychologist, [facility] [address] [place], [place] [address] [phone]

## 2022-11-11 ENCOUNTER — Ambulatory Visit (INDEPENDENT_AMBULATORY_CARE_PROVIDER_SITE_OTHER): Payer: No Typology Code available for payment source | Admitting: Psychiatry

## 2022-11-11 DIAGNOSIS — F4310 Post-traumatic stress disorder, unspecified: Secondary | ICD-10-CM

## 2022-11-11 DIAGNOSIS — Z8659 Personal history of other mental and behavioral disorders: Secondary | ICD-10-CM

## 2022-11-11 DIAGNOSIS — F411 Generalized anxiety disorder: Secondary | ICD-10-CM

## 2022-11-11 DIAGNOSIS — F99 Mental disorder, not otherwise specified: Secondary | ICD-10-CM

## 2022-11-11 DIAGNOSIS — F4323 Adjustment disorder with mixed anxiety and depressed mood: Secondary | ICD-10-CM | POA: Diagnosis not present

## 2022-11-11 DIAGNOSIS — Z630 Problems in relationship with spouse or partner: Secondary | ICD-10-CM

## 2022-11-11 DIAGNOSIS — F5105 Insomnia due to other mental disorder: Secondary | ICD-10-CM

## 2022-11-11 DIAGNOSIS — Z634 Disappearance and death of family member: Secondary | ICD-10-CM

## 2022-11-11 DIAGNOSIS — F3131 Bipolar disorder, current episode depressed, mild: Secondary | ICD-10-CM | POA: Diagnosis not present

## 2022-11-11 NOTE — Progress Notes (Signed)
Psychotherapy Progress Note [facility] [name], PhD LP  Patient ID: Tristan [name])    MRN: [id number] Therapy format: Individual psychotherapy Date: 11/11/2022      Start: 8:16a     Stop: 9:02a     Time Spent: 46 min Location: Telehealth visit -- I connected with this patient by an approved telecommunication method (video), with his informed consent, and verifying identity and patient privacy.  I was located at my office and patient at his home.  As needed, we discussed the limitations, risks, and security and privacy concerns associated with telehealth service, including the availability and conditions which currently govern in-person appointments and the possibility that 3rd-party payment may not be fully guaranteed and he may be responsible for charges.  After he indicated understanding, we proceeded with the session.  Also discussed treatment planning, as needed, including ongoing verbal agreement with the plan, the opportunity to ask and answer all questions, his demonstrated understanding of instructions, and his readiness to call the office should symptoms worsen or he feels he is in a crisis state and needs more immediate and tangible assistance.   Session narrative (presenting needs, interim history, self-report of stressors and symptoms, applications of prior therapy, status changes, and interventions made in session) "Things continue to be a challenge".  Trying to press forward, but hitting a "quagmire point" after tackling the easier decisions and getting a couple rooms livable.  Appliance needs also retard motivation to do things in the kitchen.  Not having regular feelings of wanting to hop a plane home, but definitely pressured, and slowed.  Re career/art interests, not yet found a comic shop and other destinations that suit him and give him the lift he's used to.  Finding things are substantially more expensive, too.  In the minutiae of selling the  old house, which is taking longer than anticipated, too.  For her part, Tristan [name] is descending into depression, and apparently remaking her habits of spending long time preoccupied with her phone, deep into neighborhood issues conversations with former neighbors, same as before, seemingly repeating self-deceiving escapist/avoidant behavior, only now she can vocalize it and begin to conceptualize it as a problem.  She is warming up to engage the EAP program at [facility] now, which is hopeful.  Realizing they are both "drained batteries", not just [place] depriving him, which helps combat impulses to resent and avoid her.  Still hard to talk that much, still hard for him to separate from the moment of being irritated, but it seems to be mostly internal, not actually taking it out on her.  Of course, she can still infer, rightly or wrongly, and continue to encourage perception-checking and releasing her worries where able.  On the up side, one set of neighbors have introduced themselves, look like friend material.  Brought fresh flowers that happened to be the kind in Tristan [name] wedding bouquet, which moved her immediately to tears.  Tristan [name] has made his first commuter flight to visit a friend in state, has another one planned.  Affirmed and encouraged.  Refreshed the idea of setting up visual beacons of hope -- a [employer] figure, or some other talisman from among the toys or other items around.  Added the idea of doing something unilaterally encouraging, e.g., sneaking a love note, or some quirky object, into Tristan [name] work bag.  Also encouraged to meet the challenge to reach out a hand or a touch, in some way, as it may speak volumes his words  cannot find.  Took up making the transition to in-state therapy, now that he has worked through shock.  Admittedly still many task of settling in, getting to know the place, making repairs and upgrades, and setting new norms together, but now that the most drastic feelings and impulses  have relieved, it is becoming less defensible to maintain telehealth service to prevent patient abandonment and deterioration.  Suggested he check with the [facility] EAP, which probably has a selection of capable and compatible therapists in the vicinity, and affirmed I stand ready to inform and assist next therapist, whomever and whenever.   Therapeutic modalities: Cognitive Behavioral Therapy, Solution-Oriented/Positive Psychology, and Ego-Supportive  Mental Status/Observations:  Appearance:   Not assessed     Behavior:  Appropriate  Motor:  Not assessed  Speech/Language:   Clear and Coherent  Affect:  Not assessed  Mood:  anxious, dysthymic, and improving  Thought process:  normal  Thought content:    WNL  Sensory/Perceptual disturbances:    WNL  Orientation:  Fully oriented  Attention:  Good    Concentration:  Fair  Memory:  WNL  Insight:    Good  Judgment:   Good  Impulse Control:  Fair   Risk Assessment: Danger to Self: No Self-injurious Behavior: No Danger to Others: No Physical Aggression / Violence: No Duty to Warn: No Access to Firearms a concern: No  Assessment of progress:  progressing  Diagnosis:   ICD-10-CM   1. Bipolar affective disorder, currently depressed, mild (HCC)  F31.31     2. Generalized anxiety disorder  F41.1     3. Adjustment disorder with mixed anxiety and depressed mood  F43.23     4. History of posttraumatic stress disorder (PTSD)  Z86.59     5. Relationship problem between partners  Z63.0     6. Insomnia due to other mental disorder  F51.05    F99     7. Bereavement (multiple pets, some tragic)  Z63.4     8. PTSD (post-traumatic stress disorder)  F43.10      Plan:  With [facility] -- Communication and lability management tactics as noted before.  Keep working constructive agreement to focus on projects at hand, not create new ones without a true agreement they're necessary.  OK to ask her not to catastrophize, try not to fight it when  she does.  Stay as matter of fact as possible whenever assertiveness is needed, figuring she's operating on an inflated fear he will become enraged and she will feel guilty.  OK to honestly answer a "what's wrong" question, and trust her to hear out any honest sadness, anxiety, intimidation, loneliness, etc.  Reward any incremental improvements she makes in self-restraint, compromise, realistic planning, and above all, stopping to listen without jumping to conclusions or having to come up with an irrational "fix".  When irritating, assume she's put it on herself to urgently placate him without perception-checking.  OK to keep asking her to notice when she is flooding him or abandoning him, and ask her to change her approach to whatever the goal is right now.  Notice early any resentment of his own trying to brew, and use it as a cue to either care well for himself, share and ask consideration, or ask a break to collect himself, letting her know he'll come back.  Remember that letting her know and asking her to hear something is also a vote of confidence that she is capable, even if he has to spell  things out, and even if she misses it in the moment.  Where possible, seek to make agreements on how they want to handle stress/anxiety/disappointment they can forecast, before being under the gun with events and moods.  As able, designate when it's work time, work on relationship time, or don't have to work at anything time.  Open invitation to couples session(s), in whatever venue works. Relocation tasks -- [place] Dopp with the chaos of setting up house.  Where needed, mask things that are too much to see and use visual cues to help focus and break down tasks.  Continue to assess needs for setting up housekeeping and creating new social circle and services.  Continue decluttering and downsizing as needed.  Available to consult on finding local services once in condition to seek. For stress management and distress tolerance --  Continuing option to try stressful event journaling (what, how feel, what did, how turned out) just to externalize and to create a personal "menu" of things he can do to cope with distress.  Both should help make options more handy, break down sense of helplessness that may fuel anger.  Include ludicrous or taboo options to help legitimize sense of choice. For morale, self-esteem, anxiety about coping, anger -- Hold onto friends' affirmations, and act on pledges of being available by phone, video, or travel.  Option use [employer] or something similar as a Editor, commissioning (a English as a second language teacher in [employer]").  Make sure to break down what seem like gargantuan tasks getting settled. Sleep regulation -- Address sleep hygiene and light control as needed Substances -- Stay temperate with alcohol and MJ.  Prefer no THC, and continue to encourage trying nonchemical soothing first, wherever possible.  Transition of care -- Check with Tristan [name] company's EAP about suitable local therapists Other recommendations/advice as may be noted above Continue to utilize previously learned skills ad lib Maintain medication as prescribed and work faithfully with relevant prescriber(s) if any changes are desired or seem indicated Call the clinic on-call service, 988/hotline, 911, or present to [facility] or ER if any life-threatening psychiatric crisis Return for time as available, needs tele. Already scheduled visit in this office Visit date not found.  [name], PhD [name], PhD LP Clinical Psychologist, [facility] [address] [place], [place] [address] [phone]

## 2022-11-13 ENCOUNTER — Other Ambulatory Visit: Payer: Self-pay | Admitting: Physician Assistant

## 2022-11-20 ENCOUNTER — Other Ambulatory Visit: Payer: Self-pay

## 2022-11-20 ENCOUNTER — Telehealth: Payer: Self-pay | Admitting: Psychiatry

## 2022-11-20 MED ORDER — DIAZEPAM 5 MG PO TABS: 5.0000 mg | ORAL_TABLET | Freq: Three times a day (TID) | ORAL | 0 refills | Status: AC | PRN

## 2022-11-20 MED ORDER — HYDROXYZINE HCL 25 MG PO TABS: ORAL_TABLET | ORAL | 0 refills | Status: DC

## 2022-11-20 NOTE — Telephone Encounter (Signed)
Pended/sent 

## 2022-11-20 NOTE — Telephone Encounter (Signed)
Requesting refills on Atarax 25 mg and Valium 5 mg called to:  [facility], [place]   Phone: [phone]  Fax: [phone]

## 2022-12-01 ENCOUNTER — Ambulatory Visit (INDEPENDENT_AMBULATORY_CARE_PROVIDER_SITE_OTHER): Payer: No Typology Code available for payment source | Admitting: Psychiatry

## 2022-12-01 DIAGNOSIS — F5105 Insomnia due to other mental disorder: Secondary | ICD-10-CM

## 2022-12-01 DIAGNOSIS — F1210 Cannabis abuse, uncomplicated: Secondary | ICD-10-CM

## 2022-12-01 DIAGNOSIS — F4323 Adjustment disorder with mixed anxiety and depressed mood: Secondary | ICD-10-CM | POA: Diagnosis not present

## 2022-12-01 DIAGNOSIS — Z8659 Personal history of other mental and behavioral disorders: Secondary | ICD-10-CM

## 2022-12-01 DIAGNOSIS — F411 Generalized anxiety disorder: Secondary | ICD-10-CM | POA: Diagnosis not present

## 2022-12-01 DIAGNOSIS — F3131 Bipolar disorder, current episode depressed, mild: Secondary | ICD-10-CM

## 2022-12-01 DIAGNOSIS — Z630 Problems in relationship with spouse or partner: Secondary | ICD-10-CM

## 2022-12-01 DIAGNOSIS — F99 Mental disorder, not otherwise specified: Secondary | ICD-10-CM

## 2022-12-01 NOTE — Progress Notes (Signed)
Psychotherapy Progress Note [facility] [name], PhD LP  Patient ID: Tristan [name])    MRN: [id number] Therapy format: Individual psychotherapy Date: 12/01/2022      Start: 10:14a     Stop: 11:01a     Time Spent: 47 min Location: Telehealth visit -- I connected with this patient by an approved telecommunication method (audio only), with his informed consent, and verifying identity and patient privacy.  I was located at my office and patient at his home.  As needed, we discussed the limitations, risks, and security and privacy concerns associated with telehealth service, including the availability and conditions which currently govern in-person appointments and the possibility that 3rd-party payment may not be fully guaranteed and he may be responsible for charges.  After he indicated understanding, we proceeded with the session.  Also discussed treatment planning, as needed, including ongoing verbal agreement with the plan, the opportunity to ask and answer all questions, his demonstrated understanding of instructions, and his readiness to call the office should symptoms worsen or he feels he is in a crisis state and needs more immediate and tangible assistance.   Session narrative (presenting needs, interim history, self-report of stressors and symptoms, applications of prior therapy, status changes, and interventions made in session) Having a plumbing emergency just this morning, with sewer line backing up.  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  Home sale got close to closing, then fell through on Saturday.  Earlier this month, did host some friends who traveled to see them, though [name] was in such poor shape emotionally that they seem to have left early because of her.  At her work,  [name] is struggling, too, with one employee who has been an ass to her.  Says she has a pattern over time of having a 93-month honeymoon with a new workplace then finding something to pick on, worries him that this will be the next one, only with much larger implications.  Recently acknowledged obsessing about him, which has had [name] seething -- knows it's not actually about her being interested in cheating, but he has kept thinking about her being emotionally missing b/c she's "thinking of another guy".    Urged to stop picking at his own emotional scabs that way and take it for what it is -- sensitive, obsessive of her, but he knows better than to go for the worst interpretation.  Does already feel a little chastened after having a few small tantrums, especially Mon/Tues last week -- e.g., turning off the porch light before she left the driveway as a maybe-message, bagging up some of her clutter that was actually things set out in an intentional way (crystals, e.g.), and texting her urgently at work at an inconvenient time.  Has it out of his system, best he can tell, but continually frustrated.  Brainstormed cathartic techniques, recommended he list them, post them, make use of them, and share with [name] that he is trying to channel anger rather than suppress it.  Ideas like brisk walk, scream in the car, yell into a pillow, hit a pillow, or just name his feelings.  Therapeutic modalities: Cognitive Behavioral Therapy, Solution-Oriented/Positive Psychology, and Ego-Supportive  Mental Status/Observations:  Appearance:   Not assessed     Behavior:  Appropriate  Motor:  Not assessed  Speech/Language:  Clear and Coherent  Affect:  Not assessed  Mood:  Tense, shaken, frustrated  Thought process:  normal  Thought content:    Rumination  Sensory/Perceptual disturbances:    WNL  Orientation:  Fully oriented  Attention:  Good    Concentration:  Good  Memory:  WNL  Insight:    Good  Judgment:   Fair   Impulse Control:  Good   Risk Assessment: Danger to Self: No Self-injurious Behavior: No Danger to Others: No Physical Aggression / Violence: No Duty to Warn: No Access to Firearms a concern: No  Assessment of progress:  stabilized  Diagnosis:   ICD-10-CM   1. Bipolar affective disorder, currently depressed, mild (HCC)  F31.31     2. Generalized anxiety disorder  F41.1     3. Adjustment disorder with mixed anxiety and depressed mood  F43.23     4. History of posttraumatic stress disorder (PTSD)  Z86.59     5. Relationship problem between partners  Z63.0     6. Insomnia due to other mental disorder  F51.05    F99     7. Tetrahydrocannabinol (THC) use disorder, mild, abuse  F12.10      Plan:  With [facility] -- Communication and lability management tactics as noted before.  Keep working constructive agreement to focus on projects at hand, not create new ones without a true agreement they're necessary.  OK to ask her not to catastrophize, try not to fight it when she does.  Stay as matter of fact as possible whenever assertiveness is needed, figuring she's operating on an inflated fear he will become enraged and she will feel guilty.  OK to honestly answer a "what's wrong" question, and trust her to hear out any honest sadness, anxiety, intimidation, loneliness, etc.  Reward any incremental improvements she makes in self-restraint, compromise, realistic planning, and above all, stopping to listen without jumping to conclusions or having to come up with an irrational "fix".  When irritating, assume she's put it on herself to urgently placate him without perception-checking.  OK to keep asking her to notice when she is flooding him or abandoning him, and ask her to change her approach to whatever the goal is right now.  Notice early any resentment of his own trying to brew, and use it as a cue to either care well for himself, share and ask consideration, or ask a break to collect himself, letting  her know he'll come back.  Remember that letting her know and asking her to hear something is also a vote of confidence that she is capable, even if he has to spell things out, and even if she misses it in the moment.  Where possible, seek to make agreements on how they want to handle stress/anxiety/disappointment they can forecast, before being under the gun with events and moods.  As able, designate when it's work time, work on relationship time, or don't have to work at anything time.  Open invitation to couples session(s), in whatever venue works. Relocation tasks -- Richardson Dopp with the chaos of setting up house.  Where needed, mask things that are too much to see and use visual cues to help focus and break down tasks.  Continue to assess needs for setting up housekeeping and creating new social circle and services.  Continue decluttering and downsizing as needed.   For stress management and distress tolerance -- Continuing option to try stressful event journaling (what, how feel, what did, how turned out) just to  externalize and to create a personal "menu" of things he can do to cope with distress.  Both should help make options more handy, break down sense of helplessness that may fuel anger.  Include ludicrous or taboo options to help legitimize sense of choice.  Make list of cathartic options for anger especially, and orient [name] to the plan to increase flexibility by using it. For morale, self-esteem, anxiety about coping, anger -- Hold onto friends' affirmations, and act on pledges of being available by phone, video, or travel.  Option use [employer] or something similar as a Editor, commissioning (a English as a second language teacher in [employer]").  Make sure to break down what seem like gargantuan tasks getting settled. Sleep regulation -- Address sleep hygiene and light control as needed Substances -- Stay temperate with alcohol and MJ.  Prefer no THC, and continue to encourage trying nonchemical soothing first, wherever possible.  Transition  of care -- Check with [name]'s company's EAP about suitable local therapists Other recommendations/advice as may be noted above Continue to utilize previously learned skills ad lib Maintain medication as prescribed and work faithfully with relevant prescriber(s) if any changes are desired or seem indicated Call the clinic on-call service, 988/hotline, 911, or present to [facility] or ER if any life-threatening psychiatric crisis Return for time as already scheduled, put on [place] list. Already scheduled visit in this office 01/05/2023.  [name], PhD [name], PhD LP Clinical Psychologist, [facility] [address] [place], [place] [address] [phone]

## 2022-12-16 ENCOUNTER — Ambulatory Visit (INDEPENDENT_AMBULATORY_CARE_PROVIDER_SITE_OTHER): Payer: No Typology Code available for payment source | Admitting: Psychiatry

## 2022-12-16 DIAGNOSIS — F3131 Bipolar disorder, current episode depressed, mild: Secondary | ICD-10-CM | POA: Diagnosis not present

## 2022-12-16 DIAGNOSIS — F4323 Adjustment disorder with mixed anxiety and depressed mood: Secondary | ICD-10-CM | POA: Diagnosis not present

## 2022-12-16 DIAGNOSIS — F411 Generalized anxiety disorder: Secondary | ICD-10-CM | POA: Diagnosis not present

## 2022-12-16 DIAGNOSIS — Z630 Problems in relationship with spouse or partner: Secondary | ICD-10-CM

## 2022-12-16 DIAGNOSIS — Z8659 Personal history of other mental and behavioral disorders: Secondary | ICD-10-CM

## 2022-12-16 DIAGNOSIS — F5105 Insomnia due to other mental disorder: Secondary | ICD-10-CM | POA: Diagnosis not present

## 2022-12-16 DIAGNOSIS — F1210 Cannabis abuse, uncomplicated: Secondary | ICD-10-CM

## 2022-12-16 DIAGNOSIS — F99 Mental disorder, not otherwise specified: Secondary | ICD-10-CM

## 2022-12-16 NOTE — Progress Notes (Addendum)
Psychotherapy Progress Note [facility] [name], PhD LP  Patient ID: Tristan [name])    MRN: [id number] Therapy format: Individual psychotherapy Date: 12/16/2022      Start: 8:15a     Stop: 9:03a     Time Spent: 48 min Location: Telehealth visit -- I connected with this patient by an approved telecommunication method (audio only), with his informed consent, and verifying identity and patient privacy.  I was located at my office and patient at his home.  As needed, we discussed the limitations, risks, and security and privacy concerns associated with telehealth service, including the availability and conditions which currently govern in-person appointments and the possibility that 3rd-party payment may not be fully guaranteed and he may be responsible for charges.  After he indicated understanding, we proceeded with the session.  Also discussed treatment planning, as needed, including ongoing verbal agreement with the plan, the opportunity to ask and answer all questions, his demonstrated understanding of instructions, and his readiness to call the office should symptoms worsen or he feels he is in a crisis state and needs more immediate and tangible assistance.   Session narrative (presenting needs, interim history, self-report of stressors and symptoms, applications of prior therapy, status changes, and interventions made in session) Sewer line problem addressed, sufficient to reveal that the contractor who wanted to tunnel under the house was hustling them.  "And I didn't murder anybody" in the process.  Realization together that there's a "hustle" going, and they can be more alert to it now.  Certainly a long list of other things that will require in-home contractors.    Re. Tristan [name]'s anxious paralysis, still generally unavailable to going out, making decisions, and sometimes it feels personal.  Some snappy reactions from her, and typically he cringes rather than  risk asking after her.  Suggested 3rd option for those moments, in addition to argue or refrain completely, could be to ask which kind of tension it is without going further -- "personal" to where he should be prepared to listen to something or answer it, or just working through something herself, no need to be on eggshells.    Re. cathartic activities project, was up for brisk walks as an emotional release, but weather has been uncooperative until last 2 days and has gotten himself out for some sun and mild temperature.  Mostly hasn't recalled his cathartic options, and never made a list.  Recruited to try again during session making actual list, including walk it off, yell into pillow, just vocalize the feeling (without rehearsing any hateful attitudes), listen to old favorite teenage music (Tool, [employer]) -- just cue up 1 angry song and ask himself how it feels to hear it sung for him, work play-doh, dig a hole, tear up paper or boxes.  Advised let Tristan [name] know he is trying these things as a way of working his ability to be "flexibly frustrated".  Agreed to leave the notebook out where he can see it as a visual reminder and try some of the items by next session.Marland Kitchen    Has not tagged household frustrations that will change, but still a decent idea.  Mostly busy with cleaning and clearing spots, enough to see islands of normalcy in living environment.  Has not established a "Stage manager (e.g., a Tristan [name] figure that could stand by as an inanimate friend in the midst of feeling cast away in this foreign space), but would be interested.  Has not yet located  a comic shop or toy shop that might be a touchstone for him.  Off schedule trying to make q 2 wk visits to friends.  Missed a toy show he was looking forward to locally.  Disappointed in the shops he has located.  Will travel this weekend to a friend.  Considering trying to identify friends of friends who might be able to be guides to cool places.  Recommended  continuing to ine friends' advice, check promising venues online, and give himself full marks for looking, even if he doesn't find.  Otherwise, encouraged to consider tagging things that will change for the better ("Watch this space", e.g.) and establish a figure, clarifying that he doesn't have to talk to it and seem insane (a la "[place]"), just let it be company of a sort.  Reminded about transition of care and EAP at [facility].  Tristan [name] tried to call, got frustrated, nervous, bailed out, and is personally afraid to commit to another therapist, enough to prevent her from checking out possibilities.  Agreed that's a concern for compounding her own problem, suggested he get clearance to ask her HR on his own whether the EAP benefit extends to family members.    Therapeutic modalities: Cognitive Behavioral Therapy, Solution-Oriented/Positive Psychology, and Ego-Supportive  Mental Status/Observations:  Appearance:   Not assessed     Behavior:  Appropriate  Motor:  Not assessed  Speech/Language:   Clear and Coherent  Affect:  Not assessed  Mood:  anxious and dysthymic  Thought process:  normal  Thought content:    WNL and Rumination  Sensory/Perceptual disturbances:    WNL  Orientation:  Fully oriented  Attention:  Good    Concentration:  Fair  Memory:  grossly intact  Insight:    Good  Judgment:   Good  Impulse Control:  Good   Risk Assessment: Danger to Self: No Self-injurious Behavior: No Danger to Others: No Physical Aggression / Violence: No Duty to Warn: No Access to Firearms a concern: No  Assessment of progress:  stabilized  Diagnosis:   ICD-10-CM   1. Bipolar affective disorder, currently depressed, mild (HCC)  F31.31     2. Generalized anxiety disorder  F41.1     3. Adjustment disorder with mixed anxiety and depressed mood  F43.23     4. History of posttraumatic stress disorder (PTSD)  Z86.59     5. Relationship problem between partners  Z63.0     6. Insomnia due  to other mental disorder  F51.05    F99     7. Tetrahydrocannabinol (THC) use disorder, mild, abuse  F12.10      Plan:  With [facility] -- Communication and lability management tactics as noted before.  Keep working constructive agreement to focus on projects at hand, not create new ones without a true agreement they're necessary.  OK to ask her not to catastrophize, try not to fight it when she does.  Stay as matter of fact as possible whenever assertiveness is needed, figuring she's operating on an inflated fear he will become enraged and she will feel guilty.  OK to honestly answer a "what's wrong" question, and trust her to hear out any honest sadness, anxiety, intimidation, loneliness, etc.  Reward any incremental improvements she makes in self-restraint, compromise, realistic planning, and above all, stopping to listen without jumping to conclusions or having to come up with an irrational "fix".  When irritating, assume she's put it on herself to urgently placate him without perception-checking.  OK to keep  asking her to notice when she is flooding him or abandoning him, and ask her to change her approach to whatever the goal is right now.  Notice early any resentment of his own trying to brew, and use it as a cue to either care well for himself, share and ask consideration, or ask a break to collect himself, letting her know he'll come back.  Remember that letting her know and asking her to hear something is also a vote of confidence that she is capable, even if he has to spell things out, and even if she misses it in the moment.  Where possible, seek to make agreements on how they want to handle any stress, anxiety, or disappointment they can forecast, before getting under the gun with events, and moods.  As able, be explicit together about whether when it's work time, work on relationship time, or don't have to work at anything time they are sharing, to cut down on inarticulate conflict.  Open invitation  to couples session(s), in whatever venue works. Relocation tasks -- Tristan [name] with the chaos of setting up house.  Where needed, mask things that are too much to see and use visual cues to help focus and break down tasks.  Continue to assess needs for setting up housekeeping and creating new social circle and services.  Continue decluttering and downsizing as needed.   For stress management and distress tolerance -- Continuing option to try stressful event journaling (what, how feel, what did, how turned out) just to externalize and to create a personal "menu" of things he can do to cope with distress.  Both should help make options more handy, break down sense of helplessness that may fuel anger.  Include ludicrous or taboo options to help legitimize sense of choice.  Make list of cathartic options for anger especially, and orient Tristan [name] to the plan to increase flexibility by using it. For morale, self-esteem, anxiety about coping, anger -- Hold onto friends' affirmations, and act on pledges of being available by phone, video, or travel.  Option use Tristan [name] or something similar as a Editor, commissioning (a English as a second language teacher in [employer]").  Make sure to break down what seem like gargantuan tasks getting settled. Sleep regulation -- Address sleep hygiene and light control as needed Substances -- Stay temperate with alcohol and MJ.  Prefer no THC, and continue to encourage trying nonchemical soothing first, wherever possible.  Transition of care -- Check with Tristan [name]'s company's EAP about suitable local therapists. OK to check on his own if Morrison can't muster it.  Identified online as 8-session prepaid benefit to employees and family members. Other recommendations/advice as may be noted above Continue to utilize previously learned skills ad lib Maintain medication as prescribed and work faithfully with relevant prescriber(s) if any changes are desired or seem indicated Call the clinic on-call service, 988/hotline, 911, or present to  [facility] or ER if any life-threatening psychiatric crisis Return for time as already scheduled. Already scheduled visit in this office 01/05/2023.  [name], PhD [name], PhD LP Clinical Psychologist, [facility] [address] [place], [place] [address] [phone]

## 2023-01-05 ENCOUNTER — Ambulatory Visit: Payer: No Typology Code available for payment source | Admitting: Psychiatry

## 2023-01-05 DIAGNOSIS — F411 Generalized anxiety disorder: Secondary | ICD-10-CM | POA: Diagnosis not present

## 2023-01-05 DIAGNOSIS — F3131 Bipolar disorder, current episode depressed, mild: Secondary | ICD-10-CM

## 2023-01-05 DIAGNOSIS — Z8659 Personal history of other mental and behavioral disorders: Secondary | ICD-10-CM | POA: Diagnosis not present

## 2023-01-05 DIAGNOSIS — F4323 Adjustment disorder with mixed anxiety and depressed mood: Secondary | ICD-10-CM | POA: Diagnosis not present

## 2023-01-05 DIAGNOSIS — Z630 Problems in relationship with spouse or partner: Secondary | ICD-10-CM

## 2023-01-05 NOTE — Progress Notes (Signed)
Psychotherapy Progress Note [facility] [name], PhD LP . Patient ID: Tristan [name])    MRN: [id number] Therapy format: Individual psychotherapy Date: 01/05/2023      Start: 9:17a     Stop: 10:02a     Time Spent: 45 min Location: Telehealth visit -- I connected with this patient by an approved telecommunication method (audio only), with his informed consent, and verifying identity and patient privacy.  I was located at my office and patient at his home.  As needed, we discussed the limitations, risks, and security and privacy concerns associated with telehealth service, including the availability and conditions which currently govern in-person appointments and the possibility that 3rd-party payment may not be fully guaranteed and he may be responsible for charges.  After he indicated understanding, we proceeded with the session.  Also discussed treatment planning, as needed, including ongoing verbal agreement with the plan, the opportunity to ask and answer all questions, his demonstrated understanding of instructions, and his readiness to call the office should symptoms worsen or he feels he is in a crisis state and needs more immediate and tangible assistance.   Session narrative (presenting needs, interim history, self-report of stressors and symptoms, applications of prior therapy, status changes, and interventions made in session) Severe weather lately, bothers one of the dogs a good bit.  Has made a list of ways to get stress out, not used much.  Did find a bike he's interested in ata flea market, got it road-ready., some hope of it being something he can hop out and do.  Has begun new art pieces, which is some help, and gets some daily art work in, not a lot, but daily.  House closing tomorrow, and indications are it is relieving [name]'s financial anxiety -- talk of moving ahead now on appliances and some furniture for displaying.  Neither of them have a  history of incurring large debt, but [name] has FOO history of parents being in chronic debt.  Unified on purchases and timing and the mindset for shopping.  Morale for the two of them -- "I can't say for sure."  She's showing signs of coming out of her agitated depression, noted to a visiting friend how she's "the most Doren Custard has been" since the move.  Seems to not feel trapped, more actively working on the home environment.  Wants some recharge time himself, but not forward about it with [facility].  Maybe has thanked her, recognized her being more present, but decidedly not asking for anything that might feel like pressure on her.  Encouraged to offer any thanks or lightheartedness he can find.  This week looks to be full up with her working in the office 4 days, and time otherwise taken up with continuing to make arrangements at home, use his coping outlets, and look for more signs that she values time with him.  Believes she's aware, but challenged as to whether he's actually being explicit enough, and whether she is as acutely aware as he fantasizes, more willing to ask her to knock off home "work" for the moment, lest he contribute to resentment.    Re. finding therapy there, [name] has looked around on [employer], while [name] has info on how to contact [facility], owning that he'd rather work with a Location manager nearby, ostensibly both for durability and potential privacy.  Goals for next 2 wks, get some biking/exercise in, work further on his art, take steps identifying local therapist, and practice  being more forward asking [name] for time.  Discussed nonverbals, like line of sight, and the possibility of acknowledging assertiveness as a therapy assignment, braving his own discomfort/anxiety asking things, if he needs to manage the threat of being taken as dictatorial or condemning.  Encouraged that there probably is more to fear from letting silent self-suppression make him tense and seemingly  invulnerable, to where both he and [name] can be afraid he'll blow.  Therapeutic modalities: Cognitive Behavioral Therapy, Solution-Oriented/Positive Psychology, Ego-Supportive, and Assertiveness/Communication  Mental Status/Observations:  Appearance:   Not assessed     Behavior:  Appropriate  Motor:  Not assessed  Speech/Language:   Clear and Coherent and some halting  Affect:  Not assessed  Mood:  anxious and dysthymic  Thought process:  normal  Thought content:    worry  Sensory/Perceptual disturbances:    WNL  Orientation:  Fully oriented  Attention:  Good    Concentration:  Good  Memory:  grossly intact  Insight:    Good  Judgment:   Good  Impulse Control:  Good   Risk Assessment: Danger to Self: No Self-injurious Behavior: No Danger to Others: No Physical Aggression / Violence: No Duty to Warn: No Access to Firearms a concern: No  Assessment of progress:  progressing  Diagnosis:   ICD-10-CM   1. Bipolar affective disorder, currently depressed, mild (HCC)  F31.31     2. Generalized anxiety disorder  F41.1     3. Adjustment disorder with mixed anxiety and depressed mood  F43.23     4. History of posttraumatic stress disorder (PTSD)  Z86.59     5. Relationship problem between partners  Z63.0      Plan:  With [facility] -- Communication and lability management tactics as noted before.  Keep working constructive agreement to focus on projects at hand, not create new ones without a true agreement they're necessary.  OK to ask her not to catastrophize, try not to fight it when she does.  Stay as matter of fact as possible whenever assertiveness is needed, figuring she's operating on an inflated fear he will become enraged and she will feel guilty, and the improvement for that is engage,ment, not too much being careful.  OK to honestly answer a "what's wrong" question, and trust her to hear out any honest sadness, anxiety, intimidation, loneliness, etc.  Reward any incremental  improvements she makes in self-restraint, compromise, realistic planning, stopping to listen without jumping to conclusions or having to come up with an irrational "fix", and spending quality time together.  When irritating, assume she's put it on herself to urgently fix, or maybe placate him, without perception-checking, and inquire whether she's acting on those concerns.  OK to keep asking her to notice when she is flooding him or abandoning him, and ask her to change her approach to whatever the goal is right now.  Notice early any resentment of his own trying to brew, and use it as a cue to either care well for himself, share and ask consideration, or ask a break to collect himself, letting her know he'll come back.  Remember that letting her know and asking her to hear something is also a vote of confidence that she is capable, even if he has to spell things out, and even if she misses it in the moment.  Where possible, seek to make agreements on how they want to handle any stress, anxiety, or disappointment they can forecast, before getting under the gun with events, and  moods.  As able, be explicit together in deciding when it's work time, work on relationship time, or it can be hanging out time, to cut down on inarticulate conflict.  Open invitation to couples session(s), in whatever venue works. Relocation tasks -- Richardson Dopp with the chaos of setting up house.  If needed, mask things that are too much to see and use visual cues to help focus and break down tasks.  Continue to assess needs for setting up housekeeping and creating new social circle and services.  Continue decluttering and downsizing as needed.   For stress management and distress tolerance -- Continuing option to try stressful event journaling (what, how feel, what did, how turned out) just to externalize and to create a personal "menu" of things he can do to cope with distress.  Both should help make options more handy, break down sense of  helplessness that may fuel anger.  Include ludicrous or taboo options to help legitimize sense of choice.  Make list of cathartic options for anger especially, and orient [name] to the plan to increase his own flexibility by using it. For morale, self-esteem, anxiety about coping, anger -- Hold onto friends' affirmations, and act on pledges of being available by phone, video, or travel.  Option use [employer] or something similar as a Editor, commissioning (a English as a second language teacher in [employer]").  Make sure to break down what seem like gargantuan tasks getting settled. Sleep regulation -- Address sleep hygiene and light control as needed Substances -- Stay temperate with alcohol and MJ.  Prefer no THC, and continue to encourage trying nonchemical soothing first, wherever possible.  Transition of care -- Check with [name]'s company's EAP about suitable local therapists. OK to check on his own if [employer] can't muster it.  Identified online as 8-session prepaid benefit to employees and family members. Other recommendations/advice as may be noted above Continue to utilize previously learned skills ad lib Maintain medication as prescribed and work faithfully with relevant prescriber(s) if any changes are desired or seem indicated Call the clinic on-call service, 988/hotline, 911, or present to [facility] or ER if any life-threatening psychiatric crisis Return for time as already scheduled. Already scheduled visit in this office 01/19/2023.  [name], PhD [name], PhD LP Clinical Psychologist, [facility] [address], [address] [place], [place] [address] [phone]

## 2023-01-18 ENCOUNTER — Other Ambulatory Visit: Payer: Self-pay | Admitting: Physician Assistant

## 2023-01-19 ENCOUNTER — Ambulatory Visit: Payer: No Typology Code available for payment source | Admitting: Psychiatry

## 2023-01-19 DIAGNOSIS — Z634 Disappearance and death of family member: Secondary | ICD-10-CM

## 2023-01-19 DIAGNOSIS — F411 Generalized anxiety disorder: Secondary | ICD-10-CM | POA: Diagnosis not present

## 2023-01-19 DIAGNOSIS — F3131 Bipolar disorder, current episode depressed, mild: Secondary | ICD-10-CM | POA: Diagnosis not present

## 2023-01-19 DIAGNOSIS — F4323 Adjustment disorder with mixed anxiety and depressed mood: Secondary | ICD-10-CM | POA: Diagnosis not present

## 2023-01-19 DIAGNOSIS — Z8659 Personal history of other mental and behavioral disorders: Secondary | ICD-10-CM | POA: Diagnosis not present

## 2023-01-19 DIAGNOSIS — Z630 Problems in relationship with spouse or partner: Secondary | ICD-10-CM
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
## 2023-02-03 ENCOUNTER — Ambulatory Visit (INDEPENDENT_AMBULATORY_CARE_PROVIDER_SITE_OTHER): Payer: No Typology Code available for payment source | Admitting: Psychiatry

## 2023-02-03 DIAGNOSIS — F411 Generalized anxiety disorder: Secondary | ICD-10-CM

## 2023-02-03 DIAGNOSIS — Z634 Disappearance and death of family member: Secondary | ICD-10-CM

## 2023-02-03 DIAGNOSIS — F1210 Cannabis abuse, uncomplicated: Secondary | ICD-10-CM | POA: Diagnosis not present

## 2023-02-03 DIAGNOSIS — F4323 Adjustment disorder with mixed anxiety and depressed mood: Secondary | ICD-10-CM | POA: Diagnosis not present

## 2023-02-03 DIAGNOSIS — F3132 Bipolar disorder, current episode depressed, moderate: Secondary | ICD-10-CM

## 2023-02-03 DIAGNOSIS — Z630 Problems in relationship with spouse or partner: Secondary | ICD-10-CM

## 2023-02-03 DIAGNOSIS — Z8659 Personal history of other mental and behavioral disorders: Secondary | ICD-10-CM

## 2023-02-03 NOTE — Progress Notes (Signed)
Psychotherapy Progress Note [facility] Tristan Czar, PhD LP  Patient ID: Tristan [name])    MRN: [id number] Therapy format: Individual psychotherapy Date: 02/03/2023      Start: 9:19a     Stop: 10:07a     Time Spent: 48 min Location: Telehealth visit -- I connected with this patient by an approved telecommunication method (audio only), with his informed consent, and verifying identity and patient privacy.  I was located at my office and patient at his home.  As needed, we discussed the limitations, risks, and security and privacy concerns associated with telehealth service, including the availability and conditions which currently govern in-person appointments and the possibility that 3rd-party payment may not be fully guaranteed and he may be responsible for charges.  After he indicated understanding, we proceeded with the session.  Also discussed treatment planning, as needed, including ongoing verbal agreement with the plan, the opportunity to ask and answer all questions, his demonstrated understanding of instructions, and his readiness to call the office should symptoms worsen or he feels he is in a crisis state and needs more immediate and tangible assistance.   Session narrative (presenting needs, interim history, self-report of stressors and symptoms, applications of prior therapy, status changes, and interventions made in session) Communication some better, sees Tristan [name] more expressive, at least, not necessarily better listening.  Usual points of feeling unheard and left out -- still a lot of sequestering herself into the phone, still some attachment to neighborhood issues back here, making unilateral house decisions without apparent interest in what he wants.  This weekend she flew back, went to an old favorite [place], saw old friends, and feels she got some "closure" or something like it seeing them; overtones to [name] that she's again busty doing the  "important" stuff that's not him, and it feels still threatening to whether he can have her companionship an partnership or just be dragged along into more that he doesn't want.    Challenged to keep his attributions innocent, where possible.  He does feel neglected, but that doesn't means he intends to.  It still reads as blind spots on her part and not a little assuming on his part what things mean and what not to risk saying out of maybe real, maybe imagined fear she will crumble or get angry.  Asked to consider that Tristan [name], like him, feels like like a rejected kid in life, gravitating to what feels good, not trying to subjugate or exclude her husband but managing to make it seem that way anyway, maybe for playing it too safe trying not to risk his anger or rejection.    Did get some bits of quality time recently, collaborating on going out for breakfast, hanging out in the pool (albeit finding himself unexpectedly cold-sensitive, attributed to medication).  Few advances in exploring the area, one of them a restaurant for dinner, and noticed some potential targets for exploring again.    Still feels too risky to try asking her much, afraid his feelings of rejection will snowball into feeling un-valued, betrayed, too lonely.  Challenged to be willing to still ask for time, attention, listening, or an opinion, and to try to thank her for anything that does help him feel valid and listenable.  Apparently the shelving she got at [facility] which unclogged the space is also something he finds ugly, and her buying enough for him and his stuff tends to just prove the point she doesn't actually think of  him, and not it's dropped at his feet to assemble his shelves.  Reiterated, she probably does not know all the overtones to him, and it's going to require telling her they aren't really what he ants for his stuff (but thanks for thinking of me) -- and trust she can hear the message constructively.  Re-engaged about  transition of care.  Still looking into potential therapists and psychiatrists there.  Reportedly,  Tristan [name] overdid it searching for a therapist of her own and flopped on the effort.  EAP program still an option, if can successfully reach them.  Agreed to extend into July, just keep on it looking.  Therapeutic modalities: Cognitive Behavioral Therapy, Solution-Oriented/Positive Psychology, Ego-Supportive, and Assertiveness/Communication  Mental Status/Observations:  Appearance:   Not assessed     Behavior:  Appropriate  Motor:  Not assessed  Speech/Language:   Clear and Coherent  Affect:  Not assessed  Mood:  dysthymic  Thought process:  normal  Thought content:    Some rumination  Sensory/Perceptual disturbances:    WNL  Orientation:  Fully oriented  Attention:  Good    Concentration:  Good  Memory:  WNL  Insight:    Good  Judgment:   Good  Impulse Control:  Good   Risk Assessment: Danger to Self: No Self-injurious Behavior: No Danger to Others: No Physical Aggression / Violence: No Duty to Warn: No Access to Firearms a concern: No  Assessment of progress:  progressing  Diagnosis:   ICD-10-CM   1. Bipolar affective disorder, currently depressed, moderate (HCC)  F31.32     2. Generalized anxiety disorder  F41.1     3. Adjustment disorder with mixed anxiety and depressed mood  F43.23     4. History of posttraumatic stress disorder (PTSD)  Z86.59     5. Relationship problem between partners  Z63.0     6. Bereavement (multiple pets, some tragic)  Z63.4     7. Tetrahydrocannabinol (THC) use disorder, mild, abuse  F12.10      Plan:  With [facility] -- Manage communication and lability as noted before.  Keep working constructive agreements to focus on projects at hand, not create new ones without true agreement they're necessary.  OK to ask her not to catastrophize, try not to fight it when she does anyway.  Stay as matter of fact as possible whenever assertiveness is needed,  figuring she tends to fear he will become inflamed/enraged and she will feel guilty.  Approach with an eye toward getting her to take chances and see he's not, knowing that being too careful will play like he's suppressing much.  OK to honestly answer a "what's wrong" question, trust her to hear out honest sadness, anxiety, intimidation, loneliness, or even irritation, as long as he is honest.  Try to reward any incremental improvements she makes in self-restraint, compromise, realistic planning, stopping to listen, not jumping to conclusions,not leaping to an irrational "fix", and spending quality time together.  When irritating, assume she's put it on herself to urgently fix, or maybe placate him, without perception-checking, and be ready to inquire if she's acting on those concerns.  OK to ask her to notice when she is flooding him or abandoning him, and ask her to change her approach to whatever her goal is at the moment.  Notice early any resentment of his own trying to brew, and use it as a cue to either care well for himself, share and ask her consideration, or ask a break to collect  himself, letting her know he'll come back.  Remember that letting her know and asking her to hear something is also a vote of confidence that she is capable, even if he has to spell things out, and even if she misses it in the moment.  Where possible, seek to make agreements on how they want to handle any stress, anxiety, or disappointment they figure can come.  And be explicit together deciding when it's work time, work on relationship time, or hanging out time, to cut down on inarticulate conflict.  Open invitation to couples session(s), in whatever venue works. Relocation tasks -- Continue working though unpacking and putting away.  If needed, mask things that are overwhelming to see and use visual cues to help focus and break down tasks.  Continue to assess needs for setting up housekeeping habits and creating new social  circle and services.  Continue decluttering and downsizing where needed.   For stress management and distress tolerance -- Continuing option to try stressful event journaling (what, how feel, what did, how turned out) just to externalize and to create a personal "menu" of things he can do to cope with distress.  Both should help make options more handy, break down sense of helplessness that may fuel anger.  Include ludicrous or taboo options to help legitimize sense of choice.  Make list of cathartic options for anger especially, and orient Tristan [name] to the plan to increase his own flexibility by using it. For morale, self-esteem, anxiety about coping, anger -- Hold onto friends' affirmations, and act on pledges of being available by phone, video, or travel.  Option use [employer] or something similar as a Editor, commissioning (a English as a second language teacher in [employer]").  Make sure to break down what seem like gargantuan tasks getting settled. Sleep regulation -- Address sleep hygiene and light control as needed Substances -- Stay temperate with alcohol and MJ.  Prefer no THC, and continue to encourage trying any nonchemical soothing first, wherever possible. Transition of care -- Check with Tristan [name]'s EAP about available service and best local referrals.  OK to check on his own if [place] can't muster it.  Online info states 8 sessions prepaid benefit to both employees and family members. Other recommendations/advice -- As may be noted above.  Continue to utilize previously learned skills ad lib. Medication compliance -- Maintain medication as prescribed and work faithfully with relevant prescriber(s) if any changes are desired or seem indicated. Crisis service -- Aware of call list and work-in appts.  Call the clinic on-call service, 988/hotline, 911, or present to [facility] or ER if any life-threatening psychiatric crisis. Followup -- Return for time as already scheduled, will call.  Next scheduled visit with me 02/13/2023.  Next scheduled in this  office 02/13/2023.  [name], PhD Tristan Czar, PhD LP Clinical Psychologist, [facility] [address] [place], [place] [address] [phone]

## 2023-02-11 ENCOUNTER — Other Ambulatory Visit: Payer: Self-pay | Admitting: Physician Assistant

## 2023-02-13 ENCOUNTER — Ambulatory Visit (INDEPENDENT_AMBULATORY_CARE_PROVIDER_SITE_OTHER): Payer: No Typology Code available for payment source | Admitting: Psychiatry

## 2023-02-13 DIAGNOSIS — F411 Generalized anxiety disorder: Secondary | ICD-10-CM

## 2023-02-13 DIAGNOSIS — Z8659 Personal history of other mental and behavioral disorders: Secondary | ICD-10-CM | POA: Diagnosis not present

## 2023-02-13 DIAGNOSIS — Z9189 Other specified personal risk factors, not elsewhere classified: Secondary | ICD-10-CM

## 2023-02-13 DIAGNOSIS — F3132 Bipolar disorder, current episode depressed, moderate: Secondary | ICD-10-CM | POA: Diagnosis not present

## 2023-02-13 DIAGNOSIS — Z630 Problems in relationship with spouse or partner: Secondary | ICD-10-CM

## 2023-02-13 DIAGNOSIS — Z634 Disappearance and death of family member: Secondary | ICD-10-CM

## 2023-02-13 DIAGNOSIS — F4323 Adjustment disorder with mixed anxiety and depressed mood: Secondary | ICD-10-CM

## 2023-02-13 NOTE — Progress Notes (Signed)
Psychotherapy Progress Note [facility] [name], PhD LP  Patient ID: Tristan [name])    MRN: [id number] Therapy format: Individual psychotherapy Date: 02/13/2023      Start: 10:10a     Stop: 11:00a     Time Spent: 50 min Location: Telehealth visit -- I connected with this patient by an approved telecommunication method (audio only), with his informed consent, and verifying identity and patient privacy.  I was located at my office and patient at his home.  As needed, we discussed the limitations, risks, and security and privacy concerns associated with telehealth service, including the availability and conditions which currently govern in-person appointments and the possibility that 3rd-party payment may not be fully guaranteed and he may be responsible for charges.  After he indicated understanding, we proceeded with the session.  Also discussed treatment planning, as needed, including ongoing verbal agreement with the plan, the opportunity to ask and answer all questions, his demonstrated understanding of instructions, and his readiness to call the office should symptoms worsen or he feels he is in a crisis state and needs more immediate and tangible assistance.   Session narrative (presenting needs, interim history, self-report of stressors and symptoms, applications of prior therapy, status changes, and interventions made in session) Tristan [name] and Tristan [name] have found a local practice that offers both medication and therapy, seems like a [facility], but the one counselor taking new patients is only there limited days.  Credit to [place] for searching and locating, and once the door was open, Tristan [name] took a couple days to work up the courage, then [employer] on his own behalf, making the connection.  May still want to look further, discussed search terms for finding a more available therapist most like this one.  Admits he has not found a dentist but still needs.  No  longer a need to do foundational repairs to the house.  With [place], there will always be a project.  Working on Aeronautical engineer and overgrowth clearouts (esp tree of heaven, invasive) and attic insulation.  Weather has been less provocative lately, and Prozac is helping the dogs, though poisonous frogs are still there, and too much habitat for them to thrive and alarm [name].    Re. the shelves that became a point of anger, Tristan [name] went ahead and assembled the extra [employer] items herself, without him asking.  Probed about times when maybe he has risked asserting himself, as discussed; hard to think of, but subtly, asked her what she wants for dinner, which in their case means some limit-setting for the habit of procrastinating choices and creating chaos.  It is easing a growing frustration to do that,   Probed about how he warmed up to offer like that, hard to call, but basically suggestion is he came to himself that he loves her.  Encouraged to go on catching whatever makes it safer and more riskable to ask things of her or express his own wishes without self-stifling and then feeling stifled by her.    Has noticed lately that Tristan [name] has lost her ability to watch movies of any kind, intolerant of dramatic tension.  As such, they have been able to enjoy humor more, like Mrs. Maisel.  Suggested possibility this is both a blessing and a sign of neurological wear on her part, might look into B vitamins and maybe MTHFR on her behalf.    Has not yet received the Reromanticizing exercise mailed 10 days ago.  Looked up online resources,  suggested a download from [email or web address].  Therapeutic modalities: Cognitive Behavioral Therapy, Solution-Oriented/Positive Psychology, Ego-Supportive, and Assertiveness/Communication  Mental Status/Observations:  Appearance:   Not assessed     Behavior:  Appropriate  Motor:  Not assessed  Speech/Language:   Clear and Coherent  Affect:  Not assessed  Mood:  anxious and  depressed  Thought process:  normal  Thought content:    WNL  Sensory/Perceptual disturbances:    WNL  Orientation:  Fully oriented  Attention:  Good    Concentration:  Fair  Memory:  WNL  Insight:    Good  Judgment:   Good  Impulse Control:  Variable   Risk Assessment: Danger to Self: No Self-injurious Behavior: No Danger to Others: No Physical Aggression / Violence: No Duty to Warn: No Access to Firearms a concern: No  Assessment of progress:  progressing  Diagnosis:   ICD-10-CM   1. Bipolar affective disorder, currently depressed, moderate (HCC)  F31.32     2. Generalized anxiety disorder  F41.1     3. Adjustment disorder with mixed anxiety and depressed mood  F43.23     4. History of posttraumatic stress disorder (PTSD)  Z86.59     5. Relationship problem between partners  Z63.0     6. Bereavement (multiple pets, some tragic)  Z63.4     7. At risk for dental problems  Z91.89      Plan:  With [facility] -- Manage communication and lability as noted before.  Keep working constructive agreements to focus on projects at hand, not create new ones without true agreement they're necessary.  OK to ask her not to catastrophize, try not to fight it when she does anyway.  Stay as matter of fact as possible whenever assertiveness is needed, figuring she tends to fear he will become inflamed/enraged and she will feel guilty.  Approach with an eye toward getting her to take chances and see he's not, knowing that being too careful will play like he's suppressing much.  OK to honestly answer a "what's wrong" question, trust her to hear out honest sadness, anxiety, intimidation, loneliness, or even irritation, as long as he is honest.  Try to reward any incremental improvements she makes in self-restraint, compromise, realistic planning, stopping to listen, not jumping to conclusions,not leaping to an irrational "fix", and spending quality time together.  When irritating, assume she's put it on  herself to urgently fix, or maybe placate him, without perception-checking, and be ready to inquire if she's acting on those concerns.  OK to ask her to notice when she is flooding him or abandoning him, and ask her to change her approach to whatever her goal is at the moment.  Notice early any resentment of his own trying to brew, and use it as a cue to either care well for himself, share and ask her consideration, or ask a break to collect himself, letting her know he'll come back.  Remember that letting her know and asking her to hear something is also a vote of confidence that she is capable, even if he has to spell things out, and even if she misses it in the moment.  Where possible, seek to make agreements on how they want to handle any stress, anxiety, or disappointment they figure can come.  And be explicit together deciding when it's work time, work on relationship time, or hanging out time, to cut down on inarticulate conflict.  Open invitation to couples session(s), in whatever venue works. Relocation tasks --  Continue working though unpacking and putting away.  If needed, mask things that are overwhelming to see and use visual cues to help focus and break down tasks.  Continue to assess needs for setting up housekeeping habits and creating new social circle and services.  Continue decluttering and downsizing where needed.   For stress management and distress tolerance -- Continuing option to try stressful event journaling (what, how feel, what did, how turned out) just to externalize and to create a personal "menu" of things he can do to cope with distress.  Both should help make options more handy, break down sense of helplessness that may fuel anger.  Include ludicrous or taboo options to help legitimize sense of choice.  Make list of cathartic options for anger especially, and orient [name] to the plan to increase his own flexibility by using it. For morale, self-esteem, anxiety about coping, anger  -- Hold onto friends' affirmations, and act on pledges of being available by phone, video, or travel.  Option use [employer] or something similar as a Editor, commissioning (a English as a second language teacher in [employer]").  Make sure to break down what seem like gargantuan tasks getting settled. Sleep regulation -- Address sleep hygiene and light control as needed Substances -- Stay temperate with alcohol and MJ.  Prefer no THC, and continue to encourage trying any nonchemical soothing first, wherever possible. Transition of care -- Check with [name]'s EAP about available service and best local referrals.  OK to check on his own if [place] can't muster it.  Online info states 8 sessions prepaid benefit to both employees and family members. Other recommendations/advice -- As may be noted above.  Continue to utilize previously learned skills ad lib. Medication compliance -- Maintain medication as prescribed and work faithfully with relevant prescriber(s) if any changes are desired or seem indicated. Crisis service -- Aware of call list and work-in appts.  Call the clinic on-call service, 988/hotline, 911, or present to [facility] or ER if any life-threatening psychiatric crisis. Followup -- Return for time as available, will call.  Next scheduled visit with me Visit date not found.  Next scheduled in this office Visit date not found.  [name], PhD [name], PhD LP Clinical Psychologist, [facility] [address] [place], [place] [address] [phone]

## 2023-02-27 ENCOUNTER — Other Ambulatory Visit: Payer: Self-pay | Admitting: Physician Assistant

## 2023-03-04 ENCOUNTER — Ambulatory Visit (INDEPENDENT_AMBULATORY_CARE_PROVIDER_SITE_OTHER): Payer: No Typology Code available for payment source | Admitting: Psychiatry

## 2023-03-04 DIAGNOSIS — F4323 Adjustment disorder with mixed anxiety and depressed mood: Secondary | ICD-10-CM | POA: Diagnosis not present

## 2023-03-04 DIAGNOSIS — F411 Generalized anxiety disorder: Secondary | ICD-10-CM

## 2023-03-04 DIAGNOSIS — Z634 Disappearance and death of family member: Secondary | ICD-10-CM

## 2023-03-04 DIAGNOSIS — F40232 Fear of other medical care: Secondary | ICD-10-CM

## 2023-03-04 DIAGNOSIS — Z630 Problems in relationship with spouse or partner: Secondary | ICD-10-CM

## 2023-03-04 DIAGNOSIS — F3132 Bipolar disorder, current episode depressed, moderate: Secondary | ICD-10-CM | POA: Diagnosis not present

## 2023-03-04 DIAGNOSIS — Z8659 Personal history of other mental and behavioral disorders: Secondary | ICD-10-CM

## 2023-03-04 NOTE — Progress Notes (Signed)
Psychotherapy Progress Note [facility] [name], PhD LP  Patient ID: RUVIM ZAVATSKY)    MRN: [id number] Therapy format: Individual psychotherapy Date: 03/04/2023      Start: 11:11a     Stop: 12:00n     Time Spent: 49 min Location: Telehealth visit -- I connected with this patient by an approved telecommunication method (audio only), with his informed consent, and verifying identity and patient privacy.  I was located at my office and patient at his home.  As needed, we discussed the limitations, risks, and security and privacy concerns associated with telehealth service, including the availability and conditions which currently govern in-person appointments and the possibility that 3rd-party payment may not be fully guaranteed and he may be responsible for charges.  After he indicated understanding, we proceeded with the session.  Also discussed treatment planning, as needed, including ongoing verbal agreement with the plan, the opportunity to ask and answer all questions, his demonstrated understanding of instructions, and his readiness to call the office should symptoms worsen or he feels he is in a crisis state and needs more immediate and tangible assistance.   Session narrative (presenting needs, interim history, self-report of stressors and symptoms, applications of prior therapy, status changes, and interventions made in session) Weather issues of late, nothing catastrophic.  Feeling some "disconnect" (demoralization) trying to happen with too many things to handle, and feeling a creeping loneliness with [name] withdrawing.  Trying to get better at using electronic communication, though it's still plenty uncomfortable to use video (unconfident about running it, and video is hard on his senses).  Will be flying to see friends soon, which promises to help, as well as having a friend or two fly in.  One much-anticipated trip coming up tomorrow, after 4 foiled  ("snake bitten") attempts.  Not getting the recharge he hopes to by visiting friends, and flying standby on the company benefit is proving to be more uncertain and challenging than expected.  Dislikes waiting anyway, but also found that waiting too long to arrive meant too long in security, long enough to miss his flight altogether.  Crowds are sensitizing sometimes, and he can find himself restless, unfocused, thought he can also like seeing it when a stranger gets upset and acts out.  Affirmed the value of arriving early enough, and discussed ways to pass time or entertain himself when the only decent choice is to wait, in the terminal.  Re. sinking roots into his community, and making lifestyle in the new place, there are so many little projects.  A couple of familiar touchpoints and favorite spots seem threatened now, e.g., favored grocery store is now going to be sold, raising concern they'll lose the meat and veg selection and the vibe they have liked.  Discussed ongoing prospect of identifying places to see and hang out and encouraged to still explore, not despair.  Affirms he is motivated regardless of whether [name] will accompany him, something which has maybe become an increased problem of late.  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  Still believes it's fruitless even to try to bring up the subject and recruit her to try coping with longer times.  Illustrative story of a very  short walk together, dogs on leashes without harnesses, and [name]'s over the top anxiety about them pulling and panting, overtalking it after they returned, and [name] privately and resentfully deciding it may be the last time he ever consents to walk them together.  Encouraged to still be willing to assert his wishes but also be ready to kindly ask what terrifies her so much and kindly challenge her to trust more.    Re. establishing new care, he and [name] have gotten initial appointments (she for therapy, he for psychiatry).  He is yet to meet his therapist, and it's proving to be a tight window to fit, as the man is only available 2 Saturdays a month, and Saturdays are in demand for other things.  Appealing so far that the practice, like [facility], offers both services and can see both of them.  Affirmed and encouraged.  Re. 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  Sounds to be working out practical problems like UV filtering for the windows, and finding that it is more of a lift to just get familiar and meaningful things in sight, to establish a sense of home and continuity beyond all he's been chafing about.  Trying to break down the task, getting pieces done and fleshing out his display without getting paralyzed by all-or-none approach, and trying to stay mindful that if he doesn't like the way things are staged, he can always rework it, and that's easier than having to throw out a resin casting or strip and re-paint an art project.  Further encouraged in humane, experimental approach to home making and continuing to just get things out and up until he finds more at-home-ness.  At this point has no more appts scheduled, and he has a local replacement on the books.  Discussed likely termination but will still make available if it falls through or he  travels back.  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  Therapeutic modalities: Cognitive Behavioral Therapy, Solution-Oriented/Positive Psychology, Ego-Supportive, and Humanistic/Existential  Mental Status/Observations:  Appearance:   Not assessed     Behavior:  Appropriate  Motor:  Not assessed  Speech/Language:   Clear and Coherent  Affect:  Not assessed  Mood:  anxious and depressed  Thought process:  normal  Thought content:    WNL  Sensory/Perceptual disturbances:    WNL  Orientation:  Fully oriented  Attention:  Good    Concentration:  Good  Memory:  WNL  Insight:    Good  Judgment:   Good  Impulse Control:  Good   Risk Assessment: Danger to Self: No Self-injurious Behavior: No Danger to Others: No Physical Aggression / Violence: No Duty to Warn: No Access to Firearms a concern: No  Assessment of progress:  progressing  Diagnosis:   ICD-10-CM   1. Bipolar affective disorder, currently depressed, moderate (HCC)  F31.32     2. Generalized anxiety disorder  F41.1     3. Adjustment disorder with mixed anxiety and depressed mood  F43.23     4. Relationship problem between partners  Z63.0     5. History of posttraumatic stress disorder (PTSD)  Z86.59     6. Bereavement (multiple pets, some tragic)  Z63.4     7. 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  Keep working constructive agreements to focus on projects at hand, not create new ones without true agreement they're necessary.  OK to ask her not to catastrophize, try not to fight it when she does anyway.  Stay as matter of fact as possible whenever assertiveness is needed, figuring she tends to fear he will become  inflamed/enraged and she will feel guilty.  Approach with an eye toward getting her to take chances and see he's not, knowing that being too careful will play like he's suppressing much.  OK to honestly answer a "what's wrong" question, trust her to hear out honest sadness, anxiety, intimidation, loneliness, or even irritation, as long as he is honest.  Try to reward any incremental improvements she makes in self-restraint, compromise, realistic planning, stopping to listen, not jumping to conclusions,not leaping to an irrational "fix", and spending quality time together.  When irritating, assume she's put it on herself to urgently fix, or maybe placate him, without perception-checking, and be ready to inquire if she's acting on those concerns.  OK to ask her to notice when she is flooding him or abandoning him, and ask her to change her approach to whatever her goal is at the moment.  Notice early any resentment of his own trying to brew, and use it as a cue to either care well for himself, share and ask her consideration, or ask a break to collect himself, letting her know he'll come back.  Remember that letting her know and asking her to hear something is also a vote of confidence that she is capable, even if he has to spell things out, and even if she misses it in the moment.  Where possible, seek to make agreements on how they want to handle any stress, anxiety, or disappointment they figure can come.  And be explicit together deciding when it's work time, work on relationship time, or hanging out time, to cut down on inarticulate conflict.  Open invitation to couples session(s), in whatever venue works.  Suggest Reromanticizing exercise from [place] or similar available for renewal and improving positive interaction. Relocation tasks -- Continue working though unpacking and putting away.  If needed, mask things that are overwhelming to see and use visual cues to help focus and break down tasks.  Continue to  assess needs for setting up housekeeping habits and creating new social circle and services.  Continue decluttering and downsizing where needed.  For morale and anger's sake, hold onto friends' affirmations, and act on pledges of being available by phone, video, or travel.  Option use [employer] or something similar as a Editor, commissioning (a English as a second language teacher in [employer]").  Make sure to break down what seem like gargantuan tasks getting settled and do some, not all, to keep from doing nothing and reinforcing feeling stuck or confined. For stress management and distress tolerance -- Continuing option to try stressful event journaling (what, how feel, what did, how turned out) just to externalize and to create a personal "menu" of things he can do to cope  with distress.  Both should help make options more handy, break down sense of helplessness that may fuel anger.  Include ludicrous or taboo options to help legitimize sense of choice.  Make list of cathartic options for anger especially, and orient [name] to the plan to increase his own flexibility by using it. Sleep regulation -- Address sleep hygiene and light control as needed Substances -- Stay temperate with alcohol and MJ.  Prefer no THC, and continue to encourage trying any nonchemical soothing first, wherever possible. Transition of care -- Continue with selected local therapist and psychiatry.  Termination at this time with option to reconnect and to provide for therapist-therapist briefing.  Go on and establish new dental care to complete the work needed to be fully safe and functional. Other recommendations/advice -- As may be noted above.  Continue to utilize previously learned skills ad lib. Medication compliance -- Maintain medication as prescribed and work faithfully with relevant prescriber(s) if any changes are desired or seem indicated. Crisis service -- Aware of call list and work-in appts.  Call the clinic on-call service, 988/hotline, 911, or present to [facility]  or ER if any life-threatening psychiatric crisis. Followup -- Return for time as available, will call, needs tele.  Next scheduled visit with me Visit date not found.  Next scheduled in this office Visit date not found.  [name], PhD [name], PhD LP Clinical Psychologist, [facility] [address], [address] [place], [place] [address] [phone]

## 2023-04-13 ENCOUNTER — Other Ambulatory Visit: Payer: Self-pay | Admitting: Physician Assistant

## 2023-04-13 NOTE — Telephone Encounter (Signed)
Has he found provider in [place]?

## 2023-05-12 ENCOUNTER — Other Ambulatory Visit: Payer: Self-pay | Admitting: Physician Assistant

## 2023-05-17 ENCOUNTER — Other Ambulatory Visit: Payer: Self-pay | Admitting: Physician Assistant

## 2023-05-18 NOTE — Telephone Encounter (Signed)
Did patient move to [facility]?

## 2023-05-20 NOTE — Telephone Encounter (Signed)
Tried calling patient again and his mailbox is full. He does not have MyChart.

## 2023-05-20 NOTE — Telephone Encounter (Signed)
LVM to [facility]. Does he have a provider in [place]?

## 2023-07-03 ENCOUNTER — Other Ambulatory Visit: Payer: Self-pay | Admitting: Physician Assistant

## 2023-08-17 ENCOUNTER — Other Ambulatory Visit: Payer: Self-pay | Admitting: Physician Assistant
